# Patient Record
Sex: Male | Born: 1945 | Race: Black or African American | Hispanic: No | Smoking: Current every day smoker
Health system: Southern US, Community
[De-identification: ages and names within clinical notes are randomized; demographics above are authoritative.]

## PROBLEM LIST (undated history)

## (undated) DIAGNOSIS — R011 Cardiac murmur, unspecified: Secondary | ICD-10-CM

## (undated) DIAGNOSIS — R972 Elevated prostate specific antigen [PSA]: Secondary | ICD-10-CM

## (undated) DIAGNOSIS — C61 Malignant neoplasm of prostate: Secondary | ICD-10-CM

## (undated) DIAGNOSIS — F32A Depression, unspecified: Secondary | ICD-10-CM

## (undated) DIAGNOSIS — I10 Essential (primary) hypertension: Secondary | ICD-10-CM

## (undated) DIAGNOSIS — Z923 Personal history of irradiation: Secondary | ICD-10-CM

## (undated) DIAGNOSIS — F329 Major depressive disorder, single episode, unspecified: Secondary | ICD-10-CM

## (undated) DIAGNOSIS — Z8719 Personal history of other diseases of the digestive system: Secondary | ICD-10-CM

## (undated) DIAGNOSIS — R05 Cough: Secondary | ICD-10-CM

## (undated) DIAGNOSIS — IMO0001 Reserved for inherently not codable concepts without codable children: Secondary | ICD-10-CM

## (undated) DIAGNOSIS — R059 Cough, unspecified: Secondary | ICD-10-CM

## (undated) DIAGNOSIS — F419 Anxiety disorder, unspecified: Secondary | ICD-10-CM

## (undated) HISTORY — PX: HAND NERVE REPAIR: SHX1728

## (undated) HISTORY — DX: Malignant neoplasm of prostate: C61

## (undated) HISTORY — DX: Personal history of irradiation: Z92.3

## (undated) HISTORY — DX: Cardiac murmur, unspecified: R01.1

## (undated) HISTORY — DX: Elevated prostate specific antigen (PSA): R97.20

## (undated) HISTORY — DX: Anxiety disorder, unspecified: F41.9

## (undated) HISTORY — DX: Personal history of other diseases of the digestive system: Z87.19

## (undated) HISTORY — PX: HEMORRHOID SURGERY: SHX153

## (undated) HISTORY — DX: Major depressive disorder, single episode, unspecified: F32.9

## (undated) HISTORY — DX: Depression, unspecified: F32.A

---

## 2000-11-27 ENCOUNTER — Emergency Department (HOSPITAL_COMMUNITY): Admission: EM | Admit: 2000-11-27 | Discharge: 2000-11-27 | Payer: Self-pay | Admitting: Emergency Medicine

## 2001-10-15 ENCOUNTER — Emergency Department (HOSPITAL_COMMUNITY): Admission: EM | Admit: 2001-10-15 | Discharge: 2001-10-15 | Payer: Self-pay | Admitting: Emergency Medicine

## 2002-01-23 ENCOUNTER — Ambulatory Visit (HOSPITAL_COMMUNITY): Admission: RE | Admit: 2002-01-23 | Discharge: 2002-01-23 | Payer: Self-pay | Admitting: Family Medicine

## 2002-01-23 ENCOUNTER — Encounter: Payer: Self-pay | Admitting: Family Medicine

## 2004-02-08 ENCOUNTER — Inpatient Hospital Stay (HOSPITAL_COMMUNITY): Admission: EM | Admit: 2004-02-08 | Discharge: 2004-02-14 | Payer: Self-pay | Admitting: Emergency Medicine

## 2005-03-10 ENCOUNTER — Ambulatory Visit: Payer: Self-pay | Admitting: Family Medicine

## 2005-03-11 ENCOUNTER — Ambulatory Visit: Payer: Self-pay | Admitting: *Deleted

## 2005-03-26 ENCOUNTER — Ambulatory Visit: Payer: Self-pay | Admitting: Family Medicine

## 2005-08-02 ENCOUNTER — Ambulatory Visit: Payer: Self-pay | Admitting: Family Medicine

## 2005-11-03 ENCOUNTER — Ambulatory Visit: Payer: Self-pay | Admitting: Family Medicine

## 2005-12-02 ENCOUNTER — Ambulatory Visit: Payer: Self-pay | Admitting: Family Medicine

## 2005-12-07 ENCOUNTER — Ambulatory Visit (HOSPITAL_COMMUNITY): Admission: RE | Admit: 2005-12-07 | Discharge: 2005-12-07 | Payer: Self-pay | Admitting: Family Medicine

## 2006-02-01 ENCOUNTER — Ambulatory Visit: Payer: Self-pay | Admitting: Family Medicine

## 2007-07-06 DIAGNOSIS — F1910 Other psychoactive substance abuse, uncomplicated: Secondary | ICD-10-CM | POA: Insufficient documentation

## 2007-07-06 DIAGNOSIS — F1010 Alcohol abuse, uncomplicated: Secondary | ICD-10-CM | POA: Insufficient documentation

## 2007-07-06 DIAGNOSIS — I10 Essential (primary) hypertension: Secondary | ICD-10-CM | POA: Insufficient documentation

## 2007-07-06 DIAGNOSIS — F17200 Nicotine dependence, unspecified, uncomplicated: Secondary | ICD-10-CM

## 2007-07-10 DIAGNOSIS — Z87898 Personal history of other specified conditions: Secondary | ICD-10-CM

## 2008-01-26 ENCOUNTER — Encounter (INDEPENDENT_AMBULATORY_CARE_PROVIDER_SITE_OTHER): Payer: Self-pay | Admitting: Family Medicine

## 2008-01-26 ENCOUNTER — Ambulatory Visit: Payer: Self-pay | Admitting: Internal Medicine

## 2008-01-26 LAB — CONVERTED CEMR LAB
AST: 16 units/L (ref 0–37)
BUN: 11 mg/dL (ref 6–23)
Chloride: 106 meq/L (ref 96–112)
Cholesterol: 146 mg/dL (ref 0–200)
Creatinine, Ser: 0.86 mg/dL (ref 0.40–1.50)
HDL: 57 mg/dL (ref >39)
PSA: 6.11 ng/mL — ABNORMAL HIGH (ref 0.10–4.00)
Total CHOL/HDL Ratio: 2.6
Triglycerides: 50 mg/dL (ref <150)

## 2008-03-21 ENCOUNTER — Ambulatory Visit: Payer: Self-pay | Admitting: Internal Medicine

## 2008-04-23 ENCOUNTER — Ambulatory Visit: Payer: Self-pay | Admitting: Family Medicine

## 2008-05-24 ENCOUNTER — Ambulatory Visit: Payer: Self-pay | Admitting: Family Medicine

## 2008-05-27 ENCOUNTER — Ambulatory Visit (HOSPITAL_COMMUNITY): Admission: RE | Admit: 2008-05-27 | Discharge: 2008-05-27 | Payer: Self-pay | Admitting: Family Medicine

## 2010-10-23 ENCOUNTER — Encounter (INDEPENDENT_AMBULATORY_CARE_PROVIDER_SITE_OTHER): Payer: Self-pay | Admitting: Family Medicine

## 2010-10-23 LAB — CONVERTED CEMR LAB
Alkaline Phosphatase: 104 units/L (ref 39–117)
Basophils Relative: 1 % (ref 0–1)
CO2: 27 meq/L (ref 19–32)
Creatinine, Ser: 0.95 mg/dL (ref 0.40–1.50)
Eosinophils Absolute: 0.1 10*3/uL (ref 0.0–0.7)
Eosinophils Relative: 2 % (ref 0–5)
Glucose, Bld: 79 mg/dL (ref 70–99)
Hemoglobin: 15 g/dL (ref 13.0–17.0)
Lymphocytes Relative: 37 % (ref 12–46)
MCHC: 34.5 g/dL (ref 30.0–36.0)
Monocytes Relative: 8 % (ref 3–12)
Neutro Abs: 4.2 10*3/uL (ref 1.7–7.7)
PSA: 16.17 ng/mL — ABNORMAL HIGH (ref <=4.00)
Potassium: 4.3 meq/L (ref 3.5–5.3)
RBC: 5.02 M/uL (ref 4.22–5.81)
TSH: 1.255 microintl units/mL (ref 0.350–4.500)
Total Bilirubin: 0.8 mg/dL (ref 0.3–1.2)
Total CK: 59 units/L (ref 7–232)
Total Protein: 7 g/dL (ref 6.0–8.3)
WBC: 8.1 10*3/uL (ref 4.0–10.5)

## 2010-12-21 HISTORY — PX: PROSTATE BIOPSY: SHX241

## 2010-12-28 ENCOUNTER — Other Ambulatory Visit (HOSPITAL_COMMUNITY): Payer: Self-pay | Admitting: Family Medicine

## 2010-12-28 DIAGNOSIS — C61 Malignant neoplasm of prostate: Secondary | ICD-10-CM

## 2011-01-04 ENCOUNTER — Encounter (HOSPITAL_COMMUNITY): Payer: Self-pay

## 2011-01-04 ENCOUNTER — Ambulatory Visit (HOSPITAL_COMMUNITY): Payer: Self-pay

## 2011-01-05 ENCOUNTER — Ambulatory Visit (HOSPITAL_COMMUNITY): Payer: Self-pay

## 2011-01-05 ENCOUNTER — Other Ambulatory Visit (HOSPITAL_COMMUNITY): Payer: Self-pay

## 2011-01-05 ENCOUNTER — Encounter (HOSPITAL_COMMUNITY): Payer: Self-pay

## 2011-01-08 ENCOUNTER — Encounter (HOSPITAL_COMMUNITY): Payer: Self-pay

## 2011-01-08 ENCOUNTER — Inpatient Hospital Stay (HOSPITAL_COMMUNITY): Admission: RE | Admit: 2011-01-08 | Payer: Self-pay | Source: Ambulatory Visit

## 2011-01-13 ENCOUNTER — Ambulatory Visit (HOSPITAL_COMMUNITY)
Admission: RE | Admit: 2011-01-13 | Discharge: 2011-01-13 | Disposition: A | Discharge status: Home / Self Care | Payer: Self-pay | Source: Ambulatory Visit | Attending: Family Medicine | Admitting: Family Medicine

## 2011-01-13 ENCOUNTER — Ambulatory Visit (HOSPITAL_COMMUNITY)
Admission: RE | Admit: 2011-01-13 | Discharge: 2011-01-13 | Disposition: A | Discharge status: Home / Self Care | Payer: Self-pay | Source: Ambulatory Visit | Attending: Diagnostic Radiology | Admitting: Diagnostic Radiology

## 2011-01-13 ENCOUNTER — Other Ambulatory Visit (HOSPITAL_COMMUNITY): Payer: Self-pay | Admitting: Diagnostic Radiology

## 2011-01-13 ENCOUNTER — Encounter (HOSPITAL_COMMUNITY): Payer: Self-pay

## 2011-01-13 ENCOUNTER — Encounter (HOSPITAL_COMMUNITY)
Admission: RE | Admit: 2011-01-13 | Discharge: 2011-01-13 | Disposition: A | Discharge status: Home / Self Care | Payer: Self-pay | Source: Ambulatory Visit | Attending: Family Medicine | Admitting: Family Medicine

## 2011-01-13 DIAGNOSIS — C61 Malignant neoplasm of prostate: Secondary | ICD-10-CM

## 2011-01-13 DIAGNOSIS — M549 Dorsalgia, unspecified: Secondary | ICD-10-CM | POA: Insufficient documentation

## 2011-01-13 DIAGNOSIS — M47814 Spondylosis without myelopathy or radiculopathy, thoracic region: Secondary | ICD-10-CM | POA: Insufficient documentation

## 2011-01-13 DIAGNOSIS — Z8546 Personal history of malignant neoplasm of prostate: Secondary | ICD-10-CM | POA: Insufficient documentation

## 2011-01-13 DIAGNOSIS — R109 Unspecified abdominal pain: Secondary | ICD-10-CM | POA: Insufficient documentation

## 2011-01-13 LAB — CREATININE, SERUM: GFR calc Af Amer: >60 mL/min (ref >60)

## 2011-01-13 MED ORDER — TECHNETIUM TC 99M MEDRONATE IV KIT
23.8000 | PACK | Freq: Once | INTRAVENOUS | Status: AC | PRN
  Administered 2011-01-13: 23.8 via INTRAVENOUS

## 2011-01-15 ENCOUNTER — Ambulatory Visit (HOSPITAL_COMMUNITY): Payer: Self-pay

## 2011-01-15 ENCOUNTER — Encounter (HOSPITAL_COMMUNITY): Payer: Self-pay

## 2011-01-22 NOTE — Discharge Summary (Signed)
NAME:  ARBOR, William Mccullough                          ACCOUNT NO.:  [id number]   MEDICAL RECORD NO.:  [id number]                   PATIENT TYPE:  INP   LOCATION:  5705                                 FACILITY:  [facility]   PHYSICIAN:  Donald Pore, MD                    DATE OF BIRTH:  [birth year]   DATE OF ADMISSION:  02/08/2004  DATE OF DISCHARGE:  02/14/2004                                 DISCHARGE SUMMARY   DISCHARGE MEDICATIONS:  1.  Clonidine 0.02 mg p.o. b.i.d.  2.  Norvasc 10 mg one p.o. daily.  3.  Augmentin 875 mg one tablet b.i.d.  4.  Omeprazole 20 mg one p.o. daily.  5.  Percocet 5/325 one q.4h. p.r.n. pain.   DISCHARGE INSTRUCTIONS:  He is to abstain from alcohol.  Call for follow-up  appointment at [facility] in the next week.   DISCHARGE DIAGNOSES:  1.  Acute on chronic pancreatitis.  2.  Alcohol abuse.  3.  Pleural effusion versus pneumonia.  4.  Hypertension.   HOSPITAL COURSE:  [name] was admitted through the ED after one and a  half days of nausea and vomiting and belly pain.  No diarrhea, problems with  constipation.  He also complained of blood streaked emesis.  He has a  positive history of alcohol use and also complained of chills.  ETOH level  on admission was 123.  Initial lipase value was too high to be ready by the  machine, but upon dilution was 2548.  Blood pressure on admission was  176/110.   #1 - ACUTE ON CHRONIC PANCREATITIS:  Patient was treated with bowel rest and  analgesia.  On February 11, 2004 the patient was tolerating a clear liquid diet  and upon discharge on February 14, 2004 was eating a full diet.   #2 - ALCOHOL ABUSE/POLYSUBSTANCE ABUSE:  Patient was put on withdrawal  protocol.  He was treated with thiamine, folate, and multivitamin.  He was  instructed to abstain from alcohol upon discharge.  Patient was also given  counseling regarding his alcohol abuse and informed of treatment options.   #3 - HYPERTENSION:  Patient was hypertensive upon  admission was moderately  well controlled throughout his hospitalization.  His blood pressure was  elevated on day of discharge and this will need to be followed up upon by  his primary care physician.  His antihypertensive medication may need to be  further titrated over time.  He is discharged to home on clonidine and  Norvasc as previously stated.   #4 - PNEUMONIA VERSUS PLEURAL EFFUSION:  Patient was started on antibiotic  therapy for possible pneumonia.  He was discharged home on Augmentin b.i.d.  Upon discharge he was afebrile and tolerating antibiotic therapy well.  Elevated white blood cell count had resolved on discharge.   DISCHARGE LABORATORIES:  White blood cell count 7, hemoglobin 12.8,  hematocrit  38, platelet count 253.  BMET:  Sodium 131, potassium 4.4,  chloride 101, CO2 24, glucose 115, BUN 2, creatinine 0.8, calcium 9.0.  He  was discharged to home in stable condition.  He is to follow up within one  week with his primary care physician at [facility].                                                Donald Pore, MD    HP/MEDQ  D:  05/14/2004  T:  05/15/2004  Job:  [id number]

## 2011-04-13 ENCOUNTER — Other Ambulatory Visit (HOSPITAL_COMMUNITY): Payer: Self-pay | Admitting: Family Medicine

## 2011-04-13 DIAGNOSIS — C61 Malignant neoplasm of prostate: Secondary | ICD-10-CM

## 2011-04-19 ENCOUNTER — Ambulatory Visit (HOSPITAL_COMMUNITY)
Admission: RE | Admit: 2011-04-19 | Discharge: 2011-04-19 | Disposition: A | Discharge status: Home / Self Care | Payer: Self-pay | Source: Ambulatory Visit | Attending: Family Medicine | Admitting: Family Medicine

## 2011-04-19 DIAGNOSIS — C61 Malignant neoplasm of prostate: Secondary | ICD-10-CM | POA: Insufficient documentation

## 2011-04-19 LAB — CREATININE, SERUM
Creatinine, Ser: 1 mg/dL (ref 0.50–1.35)
GFR calc Af Amer: >60 mL/min (ref >60)
GFR calc non Af Amer: >60 mL/min (ref >60)

## 2011-04-19 MED ORDER — GADOBENATE DIMEGLUMINE 529 MG/ML IV SOLN
15.0000 mL | Freq: Once | INTRAVENOUS | Status: AC | PRN
  Administered 2011-04-19: 15 mL via INTRAVENOUS

## 2011-04-20 ENCOUNTER — Ambulatory Visit
Admission: RE | Admit: 2011-04-20 | Discharge: 2011-04-20 | Disposition: A | Discharge status: Home / Self Care | Payer: Self-pay | Source: Ambulatory Visit | Attending: Radiation Oncology | Admitting: Radiation Oncology

## 2011-04-20 DIAGNOSIS — F17200 Nicotine dependence, unspecified, uncomplicated: Secondary | ICD-10-CM | POA: Insufficient documentation

## 2011-04-20 DIAGNOSIS — C61 Malignant neoplasm of prostate: Secondary | ICD-10-CM | POA: Insufficient documentation

## 2011-04-20 DIAGNOSIS — I10 Essential (primary) hypertension: Secondary | ICD-10-CM | POA: Insufficient documentation

## 2011-07-20 ENCOUNTER — Other Ambulatory Visit: Payer: Self-pay | Admitting: Urology

## 2011-08-25 ENCOUNTER — Encounter (HOSPITAL_COMMUNITY): Payer: Self-pay | Admitting: Pharmacy Technician

## 2011-09-08 ENCOUNTER — Encounter (HOSPITAL_COMMUNITY): Payer: Self-pay

## 2011-09-08 ENCOUNTER — Encounter (HOSPITAL_COMMUNITY)
Admission: RE | Admit: 2011-09-08 | Discharge: 2011-09-08 | Disposition: A | Discharge status: Home / Self Care | Payer: Self-pay | Source: Ambulatory Visit | Attending: Urology | Admitting: Urology

## 2011-09-08 ENCOUNTER — Ambulatory Visit (HOSPITAL_COMMUNITY)
Admission: RE | Admit: 2011-09-08 | Discharge: 2011-09-08 | Disposition: A | Discharge status: Home / Self Care | Payer: Self-pay | Source: Ambulatory Visit | Attending: Urology | Admitting: Urology

## 2011-09-08 ENCOUNTER — Other Ambulatory Visit: Payer: Self-pay

## 2011-09-08 DIAGNOSIS — Z01812 Encounter for preprocedural laboratory examination: Secondary | ICD-10-CM | POA: Insufficient documentation

## 2011-09-08 DIAGNOSIS — Z01818 Encounter for other preprocedural examination: Secondary | ICD-10-CM | POA: Insufficient documentation

## 2011-09-08 DIAGNOSIS — I498 Other specified cardiac arrhythmias: Secondary | ICD-10-CM | POA: Insufficient documentation

## 2011-09-08 DIAGNOSIS — Z01810 Encounter for preprocedural cardiovascular examination: Secondary | ICD-10-CM | POA: Insufficient documentation

## 2011-09-08 HISTORY — DX: Cough: R05

## 2011-09-08 HISTORY — DX: Cough, unspecified: R05.9

## 2011-09-08 HISTORY — DX: Cardiac murmur, unspecified: R01.1

## 2011-09-08 HISTORY — DX: Essential (primary) hypertension: I10

## 2011-09-08 LAB — CBC
HCT: 35.4 % — ABNORMAL LOW (ref 39.0–52.0)
MCV: 83.1 fL (ref 78.0–100.0)
Platelets: 170 10*3/uL (ref 150–400)
RBC: 4.26 MIL/uL (ref 4.22–5.81)
RDW: 13.4 % (ref 11.5–15.5)
WBC: 7.9 10*3/uL (ref 4.0–10.5)

## 2011-09-08 LAB — BASIC METABOLIC PANEL
CO2: 26 mEq/L (ref 19–32)
Chloride: 104 mEq/L (ref 96–112)
Creatinine, Ser: 1.32 mg/dL (ref 0.50–1.35)
GFR calc Af Amer: 65 mL/min — ABNORMAL LOW (ref >90)
Potassium: 3.4 mEq/L — ABNORMAL LOW (ref 3.5–5.1)

## 2011-09-08 NOTE — Patient Instructions (Signed)
20 William Mccullough  09/08/2011   Your procedure is scheduled on: Monday 09/13/11  7:15 AM  Report to [facility] at 5:15 AM.  Call this number if you have problems the morning of surgery: [phone]   Remember:  FOLLOW LAXATIVE AND CLEAR LIQUID DIET AND ENEMA INSTRUCTIONS--DAY BEFORE YOUR SURGERY.  INSTRUCTIONS WERE GIVEN BY DR. Vevelyn Royals OFFICE   Do not eat food OR DRINK ANYTHING AFTER MIDNIGHT THE NIGHT BEFORE YOUR SURGERY.    Take these medicines the morning of surgery with A SIP OF WATER:  CLONIDINE   Do not wear jewelry.  Do not wear lotions.    Do not bring valuables to the hospital.  Contacts, dentures or bridgework may not be worn into surgery.  Leave suitcase in the car. After surgery it may be brought to your room.  For patients admitted to the hospital, checkout time is 11:00 AM the day of discharge.   Patients discharged the day of surgery will not be allowed to drive home.    Special Instructions: CHG Shower Use Special Wash: 1/2 bottle night before surgery and 1/2 bottle morning of surgery.   Please read over the following fact sheets that you were given: Blood Transfusion Information and MRSA Information AND INCENTIVE SPIROMETRY INSTRUCTIONS

## 2011-09-08 NOTE — Pre-Procedure Instructions (Signed)
PT HAS NEW ONSET OF COUGH-SOUNDS CONGESTED WHEN COUGHING--NO FEVER.  CXR WILL BE DONE TODAY PREOP-ALONG WITH EKG.

## 2011-09-11 NOTE — H&P (Signed)
CC: Prostate Cancer   History of Present Illness  [name] is a 67 year old with the following urologic history:  1) Elevated PSA: He was initially evaluated in 2007 for an elevated PSA of 4.30.  He underwent a biopsy in August 2007 which did not demonstrate malignancy.  He was recommended to followup but did not. He again presented in March 2012 after his PSA had increased to 16.17 in February 2012.  This was rechecked and had further increased to 44.94. 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  Aug 2007: 12 core biopsy -- negative, Vol 38.1 cc  TNM stage: cT1c Nx M0 PSA: 44.94 cc Gleason score: 3+3=6 Biopsy (12/21/10): 3/12 cores positive -- L apex (25%), R apex (< 5%), R lateral mid (< 5%) Prostate volume: 36.9 cc  Nomogram: OC disease: 66% EPE: 24% SVI: 15% LNI: 6.1% PFS: 92%, 89%  Baseline urinary function: IPSS: 13 Erectile function: SHIM score: 23.  He has elected to proceed with surgical therapy.    Past Medical History Problems  1. History of  Anxiety (Symptom) 300.00 2. History of  Depression 311 3. History of  Functional Murmur 785.2 4. History of  Hypertension 401.9  Surgical History Problems  1. History of  Destruction Of External Hemorrhoids 2. History of  Hand Repair Right  Current Meds 1. CefTRIAXone Sodium 500 MG Injection Solution Reconstituted; INJECT 500 MG Intramuscular;  To Be Done: 16Apr2012; Status: HOLD FOR - Administration 2. CloNIDine HCl 0.1 MG Oral Tablet; Therapy: (Recorded:30Mar2012) to 3. Micardis 20 MG Oral Tablet; Therapy: (Recorded:30Mar2012) to  Allergies Medication  1. No Known Drug Allergies  Family History Problems  1. Sororal history of  Diabetes Mellitus V18.0 2. Sororal history of  Hypertension V17.49  Social History Problems  1. Alcohol Use 2 servings daily 2. Caffeine Use 6 servings daily 3. Tobacco Use 305.1 currently smokes  about a pack and a half daily  Review of Systems  Genitourinary: dysuria, but no hematuria.  Constitutional: no fever.    Vitals  BMI Calculated: 18.51 BSA Calculated: 1.69 Height: 5 ft 9 in Weight: 125 lb    Physical Exam Constitutional: Well nourished and well developed . No acute distress.  ENT:. The ears and nose are normal in appearance.  Neck: The appearance of the neck is normal and no neck mass is present.  Pulmonary: No respiratory distress, normal respiratory rhythm and effort and clear bilateral breath sounds.  Cardiovascular: Heart rate and rhythm are normal . No peripheral edema.  Skin: Normal skin turgor, no visible rash and no visible skin lesions.  Neuro/Psych:. Mood and affect are appropriate.    Results/Data Urine [Data Includes: Last 1 Day]  12Nov2012  COLOR: YELLOW  Reference Range YELLOW APPEARANCE: CLEAR  Reference Range CLEAR SPECIFIC GRAVITY: <1.005  Abnormal Low Reference Range 1.005-1.030 pH: 5.5  Reference Range 5.0-8.0 GLUCOSE: NEG mg/dL Reference Range NEG BILIRUBIN: NEG  Reference Range NEG KETONE: NEG mg/dL Reference Range NEG BLOOD: NEG  Reference Range NEG PROTEIN: NEG mg/dL Reference Range NEG UROBILINOGEN: 0.2 mg/dL Reference Range 1.6-1.0 NITRITE: NEG  Reference Range NEG LEUKOCYTE ESTERASE: NEG  Reference Range NEG   Assessment Assessed  1. Prostate Cancer 185  Plan  Prostate cancer:  He will undergo a RAL radical prostatectomy and BPLND.

## 2011-09-13 ENCOUNTER — Other Ambulatory Visit: Payer: Self-pay

## 2011-09-13 ENCOUNTER — Encounter (HOSPITAL_COMMUNITY)
Admission: RE | Disposition: A | Discharge status: Home / Self Care | Payer: Self-pay | Source: Ambulatory Visit | Attending: Urology

## 2011-09-13 ENCOUNTER — Ambulatory Visit (HOSPITAL_COMMUNITY)
Admission: RE | Admit: 2011-09-13 | Discharge: 2011-09-13 | Disposition: A | Discharge status: Home / Self Care | Payer: Self-pay | Source: Ambulatory Visit | Attending: Urology | Admitting: Urology

## 2011-09-13 ENCOUNTER — Inpatient Hospital Stay (HOSPITAL_COMMUNITY): Payer: Self-pay | Admitting: Anesthesiology

## 2011-09-13 ENCOUNTER — Encounter (HOSPITAL_COMMUNITY): Payer: Self-pay | Admitting: Anesthesiology

## 2011-09-13 ENCOUNTER — Encounter (HOSPITAL_COMMUNITY): Payer: Self-pay

## 2011-09-13 DIAGNOSIS — F17200 Nicotine dependence, unspecified, uncomplicated: Secondary | ICD-10-CM | POA: Insufficient documentation

## 2011-09-13 DIAGNOSIS — Z5309 Procedure and treatment not carried out because of other contraindication: Secondary | ICD-10-CM | POA: Insufficient documentation

## 2011-09-13 DIAGNOSIS — I10 Essential (primary) hypertension: Secondary | ICD-10-CM | POA: Insufficient documentation

## 2011-09-13 DIAGNOSIS — C61 Malignant neoplasm of prostate: Secondary | ICD-10-CM | POA: Insufficient documentation

## 2011-09-13 HISTORY — DX: Reserved for inherently not codable concepts without codable children: IMO0001

## 2011-09-13 LAB — ABO/RH: ABO/RH(D): B NEG

## 2011-09-13 LAB — TYPE AND SCREEN: ABO/RH(D): B NEG

## 2011-09-13 SURGERY — ROBOTIC ASSISTED LAPAROSCOPIC RADICAL PROSTATECTOMY LEVEL 2
Anesthesia: General

## 2011-09-13 MED ORDER — LACTATED RINGERS IV SOLN
INTRAVENOUS | Status: DC | PRN
  Administered 2011-09-13: 07:00:00 via INTRAVENOUS

## 2011-09-13 MED ORDER — MIDAZOLAM HCL 5 MG/5ML IJ SOLN
INTRAMUSCULAR | Status: DC | PRN
  Administered 2011-09-13: 2 mg via INTRAVENOUS

## 2011-09-13 MED ORDER — FENTANYL CITRATE 0.05 MG/ML IJ SOLN: 25.0000 ug | INTRAMUSCULAR | Status: DC | PRN

## 2011-09-13 MED ORDER — CEFAZOLIN SODIUM 1-5 GM-% IV SOLN: 1.0000 g | INTRAVENOUS | Status: DC

## 2011-09-13 MED ORDER — LACTATED RINGERS IV SOLN: INTRAVENOUS | Status: DC

## 2011-09-13 MED ORDER — PROMETHAZINE HCL 25 MG/ML IJ SOLN: 6.2500 mg | INTRAMUSCULAR | Status: DC | PRN

## 2011-09-13 MED ORDER — FENTANYL CITRATE 0.05 MG/ML IJ SOLN
INTRAMUSCULAR | Status: DC | PRN
  Administered 2011-09-13: 100 ug via INTRAVENOUS

## 2011-09-13 MED ORDER — CLONIDINE HCL 0.1 MG PO TABS
0.1000 mg | ORAL_TABLET | Freq: Two times a day (BID) | ORAL | Status: DC
  Administered 2011-09-13: 0.1 mg via ORAL
  Filled 2011-09-13 (×4): qty 1

## 2011-09-13 SURGICAL SUPPLY — 34 items
CANISTER SUCTION 2500CC (MISCELLANEOUS) IMPLANT
CATH ROBINSON RED A/P 8FR (CATHETERS) IMPLANT
CHLORAPREP W/TINT 26ML (MISCELLANEOUS) IMPLANT
CLIP LIGATING HEM O LOK PURPLE (MISCELLANEOUS) IMPLANT
CLOTH BEACON ORANGE TIMEOUT ST (SAFETY) IMPLANT
CORD HIGH FREQUENCY UNIPOLAR (ELECTROSURGICAL) IMPLANT
COVER SURGICAL LIGHT HANDLE (MISCELLANEOUS) IMPLANT
COVER TIP SHEARS 8 DVNC (MISCELLANEOUS) IMPLANT
COVER TIP SHEARS 8MM DA VINCI (MISCELLANEOUS)
CUTTER ECHEON FLEX ENDO 45 340 (ENDOMECHANICALS) IMPLANT
DECANTER SPIKE VIAL GLASS SM (MISCELLANEOUS) IMPLANT
DRAPE SURG IRRIG POUCH 19X23 (DRAPES) IMPLANT
DRSG TEGADERM 6X8 (GAUZE/BANDAGES/DRESSINGS) IMPLANT
ELECT REM PT RETURN 9FT ADLT (ELECTROSURGICAL)
ELECTRODE REM PT RTRN 9FT ADLT (ELECTROSURGICAL) IMPLANT
GLOVE BIO SURGEON STRL SZ 6.5 (GLOVE) IMPLANT
GLOVE BIOGEL M STRL SZ7.5 (GLOVE) IMPLANT
GOWN STRL NON-REIN LRG LVL3 (GOWN DISPOSABLE) IMPLANT
HOLDER FOLEY CATH W/STRAP (MISCELLANEOUS) IMPLANT
IV LACTATED RINGERS 1000ML (IV SOLUTION) IMPLANT
KIT ACCESSORY DA VINCI DISP (KITS)
KIT ACCESSORY DVNC DISP (KITS) IMPLANT
NDL SAFETY ECLIPSE 18X1.5 (NEEDLE) IMPLANT
NEEDLE HYPO 18GX1.5 SHARP (NEEDLE)
PACK ROBOT UROLOGY CUSTOM (CUSTOM PROCEDURE TRAY) IMPLANT
POSITIONER SURGICAL ARM (MISCELLANEOUS) IMPLANT
RELOAD GREEN ECHELON 45 (STAPLE) IMPLANT
SET TUBE IRRIG SUCTION NO TIP (IRRIGATION / IRRIGATOR) IMPLANT
SOLUTION ELECTROLUBE (MISCELLANEOUS) IMPLANT
SPONGE GAUZE 4X4 12PLY (GAUZE/BANDAGES/DRESSINGS) IMPLANT
SUT VICRYL 0 UR6 27IN ABS (SUTURE) IMPLANT
SYR 27GX1/2 1ML LL SAFETY (SYRINGE) IMPLANT
TOWEL OR NON WOVEN STRL DISP B (DISPOSABLE) IMPLANT
WATER STERILE IRR 1500ML POUR (IV SOLUTION) IMPLANT

## 2011-09-13 NOTE — Anesthesia Postprocedure Evaluation (Signed)
Case cancelled secondary poorly controlled hypertension and new onset bundle branch block on EKG prior to induction. A Daison Braxton MD

## 2011-09-13 NOTE — Progress Notes (Signed)
Surgery cancelled due to hypertension and abnormal EKG

## 2011-09-13 NOTE — Anesthesia Preprocedure Evaluation (Addendum)
Anesthesia Evaluation  Patient identified by MRN, date of birth, ID band Patient awake    Reviewed: Allergy & Precautions, H&P , NPO status , Patient's Chart, lab work & pertinent test results  History of Anesthesia Complications Negative for: history of anesthetic complications  Airway Mallampati: III TM Distance: >3 FB Neck ROM: Full    Dental No notable dental hx. (+) Poor Dentition, Loose, Missing and Dental Advisory Given   Pulmonary Current Smoker (1 ppd),  clear to auscultation  Pulmonary exam normal       Cardiovascular Exercise Tolerance: Good hypertension, Pt. on medications neg cardio ROS - Valvular Problems/MurmursRegular Normal    Neuro/Psych Negative Neurological ROS  Negative Psych ROS   GI/Hepatic negative GI ROS, (+)     substance abuse  alcohol use,   Endo/Other  Negative Endocrine ROS  Renal/GU negative Renal ROS  Genitourinary negative   Musculoskeletal negative musculoskeletal ROS (+)   Abdominal Normal abdominal exam  (+)   Peds negative pediatric ROS (+)  Hematology negative hematology ROS (+)   Anesthesia Other Findings   Reproductive/Obstetrics negative OB ROS                          Anesthesia Physical Anesthesia Plan  ASA: II  Anesthesia Plan: General   Post-op Pain Management:    Induction: Intravenous  Airway Management Planned: Oral ETT  Additional Equipment:   Intra-op Plan:   Post-operative Plan: Extubation in OR  Informed Consent: I have reviewed the patients History and Physical, chart, labs and discussed the procedure including the risks, benefits and alternatives for the proposed anesthesia with the patient or authorized representative who has indicated his/her understanding and acceptance.   Dental advisory given  Plan Discussed with: CRNA  Anesthesia Plan Comments:         Anesthesia Quick Evaluation

## 2011-09-13 NOTE — Progress Notes (Signed)
[name] was scheduled for his radical prostatectomy today for treatment of his prostate cancer.  He was taken to the operating room in preparation for induction of anesthesia.  At that time he was noted to become extremely hypertensive with a BP of over 220/120 and had telemetry changes suggesting a new bundle branch block.  It was decided not induce anesthesia for these reasons and to cancel his surgery for today.  He has been completely asymptomatic and specifically denies any recent chest pain, palpitations, DOE, diaphoresis, etc. He is a poor historian but denies any prior cardiac history and denies ever seeing a cardiologist.  He will be transferred to the PACU and a 12-leak ECG will be obtained and we have consulted the Cardiology service to evaluate him to determine the need for any further acute evaluation and recommendations as well as to determine what outpatient evaluation he will require prior to proceeding with definitive surgery for treatment of his prostate cancer.  His surgery will tentatively be rescheduled for about 2-3 weeks from now to allow for further cardiac evaluation.  If he is found to have an underlying cardiac issue that requires further treatment and will make surgical treatment higher risk, I will consider altering our plan of treatment and discuss the option of radiation therapy with [name] again if necessary. Otherwise, he will be rescheduled for a few weeks from now for his radical prostatectomy.

## 2011-09-13 NOTE — Progress Notes (Signed)
Pt knows his cardiologist will be running further tests and Dr. Peggyann Shoals office will then reschedule surgery

## 2011-09-13 NOTE — Addendum Note (Signed)
Addendum  created 09/13/11 0910 by Aaliyah Gavel Glenn Veyda Kaufman, CRNA   Modules edited:Anesthesia LDA    

## 2011-09-13 NOTE — Progress Notes (Signed)
Surgical procedure cancelled secondary to poorly controlled hypertension and new onset bundle block per EKG in OR. Patient will require further consultation to evaluate new EKG changes and further blood pressure control. Patient made aware and transported to PACU.

## 2011-09-13 NOTE — Consult Note (Signed)
Cardiology Consult Note   Patient ID: William Mccullough MRN: [id number], DOB/AGE: March 09, 1946   Admit date: 09/13/2011 Date of Consult: 09/13/2011  Primary Physician: No primary provider on file. Primary Cardiologist: None  Pt. Profile:   William Mccullough 66 yo AA male with PMHx significant for HTN, tobacco abuse, hx of alcohol abuse, chronic pancreatitis and prostate cancer who was scheduled to undergo radical prostatectomy today; however, was canceled due to hypertension. It was also noted that he developed BBB on EKG.    Reason for consult: Cardiac evaluation of the above issues, and risk-stratification prior to rescheduled prostatectomy  Problem List: Past Medical History  Diagnosis Date  . Hypertension   . Heart murmur     pt told he has heart murmur-states it does not cause him any problems  . Cancer     prostate cancer  . Cough     09/08/11  NEW ONSET OF COUGH--STARTED 09/04/11 - NO FEVER--PT THINKS HE MAY BE GETTING A COLD  . Cold     Head congestion, non productive cough, afebrile    Past Surgical History  Procedure Date  . Hemorrhoid surgery      Allergies: No Known Allergies  HPI:   Pt was interviewed in the PACU and was found to be responsive, alert and cooperative. He was, however a poor historian. He denies follow-up with PCP or cardiologist.  He states that over the last 2-3 days, he has missed doses of his antihypertensives which include Clonidine and Micardis. He states that recently (over the past month) he has experienced sharp, exertional chest pain when climbing stares with associated DOE. He states that he has not been evaluated for these symptoms.He denies SOB, chest pain at rest, palpitations, diaphoresis and n/v.   He reports ~40 pack-year history and has a history of alcohol abuse, still consuming approx 2 drinks daily. He recalls possibly having an echocardiogram in the past, but does not remember the results. He states he was told he had a murmur once in  the [employer]. He denies prior CABG, cardiac catheterization and stress testing. No prior cardiac studies were found in Epic/Echart.  Today, he is hypertensive, currently asympatomatic, resting in the PACU.   Inpatient Medications:     . ceFAZolin (ANCEF) IV  1 g Intravenous 30 min Pre-Op  . cloNIDine  0.1 mg Oral BID   Prescriptions prior to admission  Medication Sig Dispense Refill  . aspirin 325 MG tablet Take 325 mg by mouth every 4 (four) hours as needed. Pain       . telmisartan-hydrochlorothiazide (MICARDIS HCT) 80-25 MG per tablet Take 1 tablet by mouth daily after breakfast.       . cloNIDine (CATAPRES) 0.1 MG tablet Take 0.1 mg by mouth 2 (two) times daily.         Family History  Problem Relation Age of Onset  . Diabetes type II Sister   . Hypertension Sister      History   Social History  . Marital Status: Legally Separated    Spouse Name: N/A    Number of Children: N/A  . Years of Education: N/A   Occupational History  . Retired    Social History Main Topics  . Smoking status: Current Everyday Smoker -- 1.0 packs/day for 40 years    Types: Cigarettes  . Smokeless tobacco: Never Used  . Alcohol Use: 7.0 oz/week    14 drink(s) per week     1 weekend per month  .  Drug Use: No  . Sexually Active: Not on file   Other Topics Concern  . Not on file   Social History Narrative   Lives in [place], [place]. Drinks 6 servings of caffeine daily.      Review of Systems: General: negative for chills, fever, night sweats or weight changes.  Cardiovascular: positive for chest pain, dyspnea on exertion and PND, negative for edema, orthopnea, palpitations or shortness of breath Dermatological: negative for rash Respiratory: negative for cough or wheezing Urologic: negative for hematuria Abdominal: negative for nausea, vomiting, diarrhea, bright red blood per rectum, melena, or hematemesis Neurologic: negative for visual changes, syncope, or dizziness All other  systems reviewed and are otherwise negative except as noted above.  Physical Exam: Blood pressure 182/100, pulse 64, temperature 98.8 F (37.1 C), resp. rate 14, SpO2 98.00%.   General: Well developed, well nourished, NAD Head: Normocephalic, atraumatic, sclera non-icteric, no xanthomas Neck: Supple. Negative for carotid bruits. JVD not elevated. Lungs: Expiratory rhonchi in RL, RML fields. No wheezes or rales. Breathing is unlabored. Heart: RRR with S1 S2. No murmurs, rubs, or gallops appreciated. Abdomen: Soft, non-tender, non-distended with normoactive bowel sounds. No hepatomegaly. No rebound/guarding. No obvious abdominal masses. Msk:  Strength and tone appears normal for age. Extremities: No clubbing, cyanosis or edema.  Distal pedal pulses are 2+ and equal bilaterally. Neuro: Alert and oriented X 3. Moves all extremities spontaneously. Psych:  Responds to questions appropriately with a normal affect.  Labs: No results found for this basename: WBC:2,HGB:2,HCT:2,MCV:2,PLT:2 in the last 72 hours No results found for this basename: VITAMINB12,FOLATE,FERRITIN,TIBC,IRON,RETICCTPCT in the last 72 hours No results found for this basename: DDIMER:2 in the last 72 hours  Lab 09/08/11 0955  NA 136  K 3.4*  CL 104  CO2 26  BUN 10  CREATININE 1.32  CALCIUM 9.0  PROT --  BILITOT --  ALKPHOS --  ALT --  AST --  AMYLASE --  LIPASE --  GLUCOSE 82   No results found for this basename: HGBA1C in the last 72 hours No results found for this basename: CKTOTAL:3,CKMB:3,CKMBINDEX:3,TROPONINI:3 in the last 72 hours No components found with this basename: POCBNP No results found for this basename: CHOL,HDL,LDLCALC,TRIG,CHOLHDL,LDLDIRECT in the last 72 hours No results found for this basename: TSH,T4TOTAL,FREET3,T3FREE,THYROIDAB in the last 72 hours  Radiology/Studies: Dg Chest 2 View  09/08/2011  *RADIOLOGY REPORT*  Clinical Data: Preoperative prostatectomy with history of hypertension.   New onset of cough.  CHEST - 2 VIEW  Comparison: 01/13/2011  Findings: Heart size is upper limits of normal and a normal mediastinal contour is seen.  The lung fields are clear. No pleural effusion or significant peribronchial cuffing was seen.  Bony structures demonstrate diffuse osteophytosis of the lower thoracic spine and are otherwise intact.  IMPRESSION: No worrisome or acute cardiopulmonary abnormality seen.  Original Report Authenticated By: Bertha Stakes, M.D.    EKG:   09/13/2011 0807-  NSR, 60 bpm, LVH 09/12/2010  0810- sinus bradycardia, 56 bpm, LVH, TWI V1, BBB V2 (this tracing is more consistent with prior tracing on 09/08/2011)  ASSESSMENT AND PLAN:   1. Uncontrolled Hypertension- secondary to recent medication noncompliance with likely Clonidine rebound.   - Will give outpatient dose of Clonidine and monitor  - Will stress medical compliance, tobacco/alcohol cessation  - Once BP controlled, can be discharged home with cardiology follow-up (see below)  2. Chest pain- patient's symptoms are consistent with stable angina. He denies formal cardiac work-up in the past besides an  echocardiogram. He has significant cardiac history in tobacco abuse and HTN. As he does not follow-up with PCP regularly, may also have HL. Would benefit from outpatient stress testing and repeat echocardiogram. Did note to have LVH on EKG today, which was consistent with prior tracings likely representative changes due chronic and uncontrolled HTN. Upper limit of normal cardiac silhouette on recent CXR with otherwise not acute cardiopulmonary procress.   -Will schedule outpatient follow-up with the aforementioned studies and risk-stratify prior to rescheduled prostatectomy.   - Order repeat EKG once BP better controlled   3. Hypokalemia- mild  - Would benefit from supplementation prior to discharge  Signed, R. Hurman Horn, PA-C 09/13/2011, 10:27 AM

## 2011-09-13 NOTE — Transfer of Care (Signed)
Immediate Anesthesia Transfer of Care Note  Patient: William Mccullough  Procedure(s) Performed:  ROBOTIC ASSISTED LAPAROSCOPIC RADICAL PROSTATECTOMY LEVEL 2 - Robotic Assisted Laparoscopic Radical Prostatectomy ,Bilateral Pelvic Lymphadenectomy Level 2  Patient Location: PACU  Anesthesia Type: MAC and Case cancelled due to EKG changes and elevated diastolic BP  Level of Consciousness: awake, alert , oriented, patient cooperative and responds to stimulation  Airway & Oxygen Therapy: Patient Spontanous Breathing and Patient connected to face mask oxygen  Post-op Assessment: Report given to PACU RN and Post -op Vital signs reviewed and stable  Post vital signs: Reviewed and stable  Complications: No apparent anesthesia complications

## 2011-09-13 NOTE — Progress Notes (Signed)
Dr. Laverle Patter aware of cardiology findings. OK to go home. Meds given.

## 2011-09-13 NOTE — Progress Notes (Signed)
Dr. Laverle Patter in to speak with pt and aware of 12 lead tracing. Cardiology to see.

## 2011-09-13 NOTE — Addendum Note (Signed)
Addendum  created 09/13/11 0910 by Zebedee Iba, CRNA   Modules edited:Anesthesia LDA

## 2011-09-13 NOTE — Progress Notes (Signed)
Dr. Rica Mast and aware of 12 lead. No new orders at this time.

## 2011-09-13 NOTE — Preoperative (Signed)
Beta Blockers   Reason not to administer Beta Blockers:Not Applicable 

## 2011-09-13 NOTE — OR Nursing (Signed)
Surgery was canceled per Dr. Rica Mast and Dr. Laverle Patter due to patient having EKG changes and elevated Blood Pressure upon entering the OR

## 2011-09-17 ENCOUNTER — Encounter: Payer: Self-pay | Admitting: Cardiology

## 2011-09-17 ENCOUNTER — Ambulatory Visit (INDEPENDENT_AMBULATORY_CARE_PROVIDER_SITE_OTHER): Payer: Self-pay | Admitting: Cardiology

## 2011-09-17 DIAGNOSIS — F17200 Nicotine dependence, unspecified, uncomplicated: Secondary | ICD-10-CM

## 2011-09-17 DIAGNOSIS — R079 Chest pain, unspecified: Secondary | ICD-10-CM

## 2011-09-17 DIAGNOSIS — R0602 Shortness of breath: Secondary | ICD-10-CM

## 2011-09-17 DIAGNOSIS — I10 Essential (primary) hypertension: Secondary | ICD-10-CM

## 2011-09-17 MED ORDER — BUPROPION HCL ER (SR) 150 MG PO TB12: 150.0000 mg | ORAL_TABLET | Freq: Two times a day (BID) | ORAL | Status: DC

## 2011-09-17 MED ORDER — AMLODIPINE BESYLATE 5 MG PO TABS: 5.0000 mg | ORAL_TABLET | Freq: Every day | ORAL | Status: DC

## 2011-09-17 NOTE — Patient Instructions (Addendum)
Start amlodipine 5mg  daily.  Use wellbutrin to help you stop smoking. Take 150mg  daily for 3 days then increase to 150mg  twice a day. You can take this for a total of 90 days.  Your physician has requested that you have en exercise stress myoview. For further information please visit [email or web address] Please follow instruction sheet, as given.  Your physician has requested that you have an echocardiogram. Echocardiography is a painless test that uses sound waves to create images of your heart. It provides your doctor with information about the size and shape of your heart and how well your heart's chambers and valves are working. This procedure takes approximately one hour. There are no restrictions for this procedure.  Your physician recommends that you return for a FASTING lipid profile--786.50  786.05  Your physician recommends that you schedule a follow-up appointment in: 2 weeks with Dr Shirlee Latch.

## 2011-09-19 DIAGNOSIS — R079 Chest pain, unspecified: Secondary | ICD-10-CM | POA: Insufficient documentation

## 2011-09-19 NOTE — Progress Notes (Signed)
PCP: Georganna Skeans ([facility])  66 yo with history of HTN and smoking presents for cardiology evaluation prior to prostate surgery.  He was recently diagnosed with prostate cancer by biopsy and is planned for robotic laparoscopic radical prostatectomy.  He was noted to have very high blood pressure and was therefore sent for cardiology evaluation  BP today is 179/98.  He reports elevated BP for years, since at least the 1990s.  He follows a high sodium diet in general.    Patient today also reports episodes of chest tightness.  Last Tuesday, he noted central chest tightness while riding his bike.  This resolved with rest.  He has been noting chest tightness with heavy exertion such as walking up steps or riding his bike.  He also gets short of breath if he climbs steps or walks fast.  He is smoking about 1 ppd.  He has had a cough and URI symptoms for about 2 weeks.    ECG: NSR, LVH, LAE  Labs (1/13): K 3.4, creatinine 1.32  PMH: 1. HTN 2. H/o ETOH abuse 3. H/o acute pancreatitis 4. Prostate cancer diagnosed by biopsy 5. Active smoker  SH: Retired.  Lives with sister.  Smokes 1 ppd.  Prior heavy ETOH, now some beer on weekends.  FH: Nephew had heart surgery.  He does not know about any CAD.   ROS: All systems negative except as per HPI.   Current Outpatient Prescriptions  Medication Sig Dispense Refill  . aspirin 325 MG tablet Take 325 mg by mouth every 4 (four) hours as needed. Pain       . cloNIDine (CATAPRES) 0.1 MG tablet Take 0.1 mg by mouth 2 (two) times daily.       Marland Kitchen telmisartan-hydrochlorothiazide (MICARDIS HCT) 80-25 MG per tablet Take 1 tablet by mouth daily after breakfast.       . amLODipine (NORVASC) 5 MG tablet Take 1 tablet (5 mg total) by mouth daily.  30 tablet  6  . buPROPion (WELLBUTRIN SR) 150 MG 12 hr tablet Take 1 tablet (150 mg total) by mouth 2 (two) times daily.  60 tablet  2    BP 179/98  Resp 65  Ht 5\' 9"  (1.753 m)  Wt 58.514 kg (129 lb)  BMI 19.05  kg/m2 General: NAD Neck: No JVD, no thyromegaly or thyroid nodule.  Lungs: Clear to auscultation bilaterally with normal respiratory effort. CV: Nondisplaced PMI.  Heart regular S1/S2, no S3, +S4, no murmur.  No peripheral edema.  No carotid bruit.  Normal pedal pulses.  Abdomen: Soft, nontender, no hepatosplenomegaly, no distention.  Skin: Intact without lesions or rashes.  Neurologic: Alert and oriented x 3.  Psych: Normal affect. Extremities: No clubbing or cyanosis.  HEENT: Normal.

## 2011-09-19 NOTE — Assessment & Plan Note (Signed)
BP is high and has been so for years.  He follows a high sodium diet.  - Cut back on salt in diet.  - Add amlodipine 5 mg daily and continue clonidine and telmisartan/HCTZ.  - Echo to assess for LVH.

## 2011-09-19 NOTE — Assessment & Plan Note (Signed)
I strongly encouraged him to stop smoking.  He is going to try Wellbutrin.

## 2011-09-19 NOTE — Assessment & Plan Note (Signed)
Exertional chest pain.  He has a number of risk factors for CAD.  I will get an ETT-myoview for risk stratification.  He will continue ASA.  I will also check lipids.    He should hold off on prostate surgery until BP is under control and we evaluate his exertional chest pain.

## 2011-09-23 ENCOUNTER — Ambulatory Visit (HOSPITAL_COMMUNITY): Payer: Self-pay | Attending: Cardiology | Admitting: Radiology

## 2011-09-23 ENCOUNTER — Other Ambulatory Visit (INDEPENDENT_AMBULATORY_CARE_PROVIDER_SITE_OTHER): Payer: Self-pay | Admitting: *Deleted

## 2011-09-23 DIAGNOSIS — I10 Essential (primary) hypertension: Secondary | ICD-10-CM | POA: Insufficient documentation

## 2011-09-23 DIAGNOSIS — R61 Generalized hyperhidrosis: Secondary | ICD-10-CM | POA: Insufficient documentation

## 2011-09-23 DIAGNOSIS — F17200 Nicotine dependence, unspecified, uncomplicated: Secondary | ICD-10-CM | POA: Insufficient documentation

## 2011-09-23 DIAGNOSIS — R5381 Other malaise: Secondary | ICD-10-CM | POA: Insufficient documentation

## 2011-09-23 DIAGNOSIS — R42 Dizziness and giddiness: Secondary | ICD-10-CM | POA: Insufficient documentation

## 2011-09-23 DIAGNOSIS — R072 Precordial pain: Secondary | ICD-10-CM

## 2011-09-23 DIAGNOSIS — R079 Chest pain, unspecified: Secondary | ICD-10-CM

## 2011-09-23 DIAGNOSIS — R55 Syncope and collapse: Secondary | ICD-10-CM | POA: Insufficient documentation

## 2011-09-23 DIAGNOSIS — R0789 Other chest pain: Secondary | ICD-10-CM | POA: Insufficient documentation

## 2011-09-23 DIAGNOSIS — Z01810 Encounter for preprocedural cardiovascular examination: Secondary | ICD-10-CM | POA: Insufficient documentation

## 2011-09-23 DIAGNOSIS — R0989 Other specified symptoms and signs involving the circulatory and respiratory systems: Secondary | ICD-10-CM | POA: Insufficient documentation

## 2011-09-23 DIAGNOSIS — I447 Left bundle-branch block, unspecified: Secondary | ICD-10-CM

## 2011-09-23 DIAGNOSIS — R0602 Shortness of breath: Secondary | ICD-10-CM

## 2011-09-23 DIAGNOSIS — R0609 Other forms of dyspnea: Secondary | ICD-10-CM | POA: Insufficient documentation

## 2011-09-23 LAB — LIPID PANEL
LDL Cholesterol: 73 mg/dL (ref 0–99)
Total CHOL/HDL Ratio: 3

## 2011-09-23 MED ORDER — TECHNETIUM TC 99M TETROFOSMIN IV KIT
10.0000 | PACK | Freq: Once | INTRAVENOUS | Status: AC | PRN
  Administered 2011-09-23: 10 via INTRAVENOUS

## 2011-09-23 MED ORDER — TECHNETIUM TC 99M TETROFOSMIN IV KIT
30.0000 | PACK | Freq: Once | INTRAVENOUS | Status: AC | PRN
  Administered 2011-09-23: 30 via INTRAVENOUS

## 2011-09-23 MED ORDER — REGADENOSON 0.4 MG/5ML IV SOLN
0.4000 mg | Freq: Once | INTRAVENOUS | Status: AC
  Administered 2011-09-23: 0.4 mg via INTRAVENOUS

## 2011-09-23 NOTE — Progress Notes (Addendum)
[facility] SITE 3 NUCLEAR MED [address] [place] [address] [phone]  Cardiology Nuclear Med Study  William Mccullough is a 66 y.o. male [id number] 1946-08-08   Nuclear Med Background Indication for Stress Test:  Evaluation for Ischemia and Pending Surgical Clearance for Prostatectomy  by Dr. Crecencio Mc History:  No previous documented CAD Cardiac Risk Factors: Hypertension and Smoker  Symptoms: Chest Tightness with and without exertion (last occurrence was Tuesday, 09/21/11), Diaphoresis, Dizziness, DOE, Fatigue and Near Syncope   Nuclear Pre-Procedure Caffeine/Decaff Intake:  None NPO After: 9:00pm   Lungs:  Clear.  O2 SAT 98% on RA. IV 0.9% NS with Angio Cath:  22g  IV Site: L Antecubital, tolerated well IV Started by:  Irean Hong, RN  Chest Size (in):  38 Cup Size: n/a  Height: 5\' 9"  (1.753 m)  Weight:  123 lb (55.792 kg)  BMI:  Body mass index is 18.16 kg/(m^2). Tech Comments:  All medications taken.    Nuclear Med Study 1 or 2 day study: 1 day  Stress Test Type:  Eugenie Birks  Reading MD: Olga Millers, MD  Order Authorizing Provider:  [name], MD  Resting Radionuclide: Technetium 57m Tetrofosmin  Resting Radionuclide Dose: 11.0 mCi   Stress Radionuclide:  Technetium 12m Tetrofosmin  Stress Radionuclide Dose: 33.0 mCi           Stress Protocol Rest HR: 59 Stress HR: 92  Rest BP: 130/82 Stress BP: 133/82  Exercise Time (min): n/a METS: n/a   Predicted Max HR: 157 bpm % Max HR: 58.6 bpm Rate Pressure Product: 21308   Dose of Adenosine (mg):  n/a Dose of Lexiscan: 0.4 mg  Dose of Atropine (mg): n/a Dose of Dobutamine: n/a mcg/kg/min (at max HR)  Stress Test Technologist: Smiley Houseman, CMA-N  Nuclear Technologist:  Domenic Polite, CNMT     Rest Procedure:  Myocardial perfusion imaging was performed at rest 45 minutes following the intravenous administration of Technetium 23m Tetrofosmin.  Rest ECG: LVH with strain, sinus  bradycardia.  Stress Procedure: The patient attempted to walk the treadmill utilizing the Bruce Protocol for 2:04 , but was unable to reach his target heart rate due to fatigue and an exercise induced LBBB.  He went into LBBB after only one minute of exercise. He was then given IV Lexiscan 0.4 mg over 15-seconds at rest.  Technetium 55m Tetrofosmin was injected at 30-seconds.  He again developed LBBB after one minute into the Lexiscan, but returned to baseline 2-minutes into recovery.  He did c/o chest pressure, 2/10, with the LBBB during Lexiscan.  Quantitative spect images were obtained after a 45 minute delay.  Stress ECG: Uninteretable due to development of rate related LBBB  QPS Raw Data Images:  Acquisition technically good; normal left ventricular size. Stress Images:  Normal homogeneous uptake in all areas of the myocardium. Rest Images:  Normal homogeneous uptake in all areas of the myocardium. Subtraction (SDS):  No evidence of ischemia. Transient Ischemic Dilatation (Normal <1.22):  0.98 Lung/Heart Ratio (Normal <0.45):  0.25  Quantitative Gated Spect Images QGS EDV:  98 ml QGS ESV:  47 ml QGS cine images:  NL LV Function; NL Wall Motion QGS EF: 52%  Impression Exercise Capacity:  Lexiscan with no exercise. BP Response:  Normal blood pressure response. Clinical Symptoms:  There is chest pain ECG Impression:  EKG uninterpretable due to development of rate related LBBB with stress. Comparison with Prior Nuclear Study: No previous nuclear study performed  Overall Impression:  Normal stress nuclear study.   Olga Millers   EF low on echo (40-45%), 52% on myoview.  He had a rate-related LBBB on ETT.  No perfusion defect.  Given his symptoms, will need to consider cath prior to prostate surgery.  I will need to talk to him more about his studies and would like to see him in the office early next week . The low EF could certainly be due to poor control of HTN over years.    [name] 09/24/2011 9:59 AM

## 2011-09-24 ENCOUNTER — Telehealth: Payer: Self-pay | Admitting: *Deleted

## 2011-09-24 NOTE — Telephone Encounter (Signed)
09/24/11--1340p--spoke with pt and gave results of stress test and explained that dr Shirlee Latch wants him to have c cath before prostrate surgery--pt is in agreement and i reminded him he has an appoint with dr Shirlee Latch next Friday and dr Shirlee Latch will explain everything at that visit and perhaps set him up for c cath--nt

## 2011-09-28 ENCOUNTER — Encounter (HOSPITAL_COMMUNITY): Payer: Self-pay | Admitting: Pharmacy Technician

## 2011-09-28 NOTE — Progress Notes (Signed)
Pt aware. See phone note 09/24/11.

## 2011-09-29 ENCOUNTER — Ambulatory Visit: Payer: Self-pay | Admitting: Cardiovascular Disease

## 2011-10-01 ENCOUNTER — Encounter: Payer: Self-pay | Admitting: Cardiology

## 2011-10-01 ENCOUNTER — Encounter: Payer: Self-pay | Admitting: *Deleted

## 2011-10-01 ENCOUNTER — Ambulatory Visit (INDEPENDENT_AMBULATORY_CARE_PROVIDER_SITE_OTHER): Payer: Self-pay | Admitting: Cardiology

## 2011-10-01 VITALS — BP 140/94 | HR 93 | Ht 69.0 in | Wt 126.0 lb

## 2011-10-01 DIAGNOSIS — Z01818 Encounter for other preprocedural examination: Secondary | ICD-10-CM

## 2011-10-01 DIAGNOSIS — I429 Cardiomyopathy, unspecified: Secondary | ICD-10-CM | POA: Insufficient documentation

## 2011-10-01 DIAGNOSIS — F17200 Nicotine dependence, unspecified, uncomplicated: Secondary | ICD-10-CM

## 2011-10-01 DIAGNOSIS — R079 Chest pain, unspecified: Secondary | ICD-10-CM

## 2011-10-01 DIAGNOSIS — I428 Other cardiomyopathies: Secondary | ICD-10-CM

## 2011-10-01 DIAGNOSIS — I10 Essential (primary) hypertension: Secondary | ICD-10-CM

## 2011-10-01 LAB — CBC WITH DIFFERENTIAL/PLATELET
Basophils Relative: 0.5 % (ref 0.0–3.0)
Eosinophils Relative: 1.2 % (ref 0.0–5.0)
HCT: 47.9 % (ref 39.0–52.0)
MCV: 90.3 fl (ref 78.0–100.0)
Monocytes Absolute: 0.7 10*3/uL (ref 0.1–1.0)
Neutrophils Relative %: 52.7 % (ref 43.0–77.0)
RBC: 5.31 Mil/uL (ref 4.22–5.81)
WBC: 9.6 10*3/uL (ref 4.5–10.5)

## 2011-10-01 LAB — BASIC METABOLIC PANEL
Chloride: 100 mEq/L (ref 96–112)
Creatinine, Ser: 1.6 mg/dL — ABNORMAL HIGH (ref 0.4–1.5)
Potassium: 4.2 mEq/L (ref 3.5–5.1)

## 2011-10-01 LAB — PROTIME-INR: INR: 1.1 ratio — ABNORMAL HIGH (ref 0.8–1.0)

## 2011-10-01 MED ORDER — AMLODIPINE BESYLATE 10 MG PO TABS: 10.0000 mg | ORAL_TABLET | Freq: Every day | ORAL | Status: DC

## 2011-10-01 NOTE — Progress Notes (Signed)
PCP: Amelia Wilson ([facility])  65 yo with history of HTN and smoking presented initially for cardiology evaluation prior to prostate surgery.  He was recently diagnosed with prostate cancer by biopsy and is planned for robotic laparoscopic radical prostatectomy.  He was noted to have very high blood pressure and was therefore sent for cardiology evaluation.  At last appointment, I added amlodipine.  BP is still elevated but improved.    He has been noting chest tightness with heavy exertion such as walking up steps or riding his bike.  This most commonly occurs when he rides his bike.  He will have to stop and get down to rest.  He will then walk his bike rather than trying to ride again.  He also gets short of breath if he climbs steps or walks fast.  He is smoking about 1 ppd.  He has started Wellbutrin and is trying to quit.  Echocardiogram was abnormal with EF 40-45% and diffuse hypokinesis.  ETT-myoview showed poor exercise tolerance and exercise-induced LBBB.  However, perfusion images were normal.   Labs (1/13): K 3.4, creatinine 1.32, LDL 73, HDL 46  PMH: 1. HTN 2. H/o ETOH abuse 3. H/o acute pancreatitis 4. Prostate cancer diagnosed by biopsy 5. Active smoker 6. Cardiomyopathy: Echo (1/13) with EF 40-45%, diffuse hypokinesis, mild LVH, mild MR.  ETT-myoview (1/13): patient only able to exercise 2:04 due to fatigue and dyspnea, also developed LBBB, switched to Lexiscan and developed chest pain with LBBB again; EF 52% with no perfusion defect.   SH: Retired.  Lives with sister.  Smokes 1 ppd.  Prior heavy ETOH, now some beer on weekends.  FH: Nephew had heart surgery.  He does not know about any CAD.   ROS: All systems negative except as per HPI.   Current Outpatient Prescriptions  Medication Sig Dispense Refill  . aspirin 325 MG tablet Take 325 mg by mouth every 4 (four) hours as needed. Pain       . buPROPion (WELLBUTRIN SR) 150 MG 12 hr tablet Take 1 tablet (150 mg total) by  mouth 2 (two) times daily.  60 tablet  2  . cloNIDine (CATAPRES) 0.1 MG tablet Take 0.1 mg by mouth 2 (two) times daily.       . telmisartan-hydrochlorothiazide (MICARDIS HCT) 80-25 MG per tablet Take 1 tablet by mouth daily after breakfast.       . DISCONTD: amLODipine (NORVASC) 5 MG tablet Take 5 mg by mouth daily before breakfast.      . amLODipine (NORVASC) 10 MG tablet Take 1 tablet (10 mg total) by mouth daily.  30 tablet  6    BP 140/94  Pulse 93  Ht 5' 9" (1.753 m)  Wt 57.153 kg (126 lb)  BMI 18.61 kg/m2 General: NAD, thin Neck: No JVD, no thyromegaly or thyroid nodule.  Lungs: Clear to auscultation bilaterally with normal respiratory effort. CV: Nondisplaced PMI.  Heart regular S1/S2, no S3, +S4, no murmur.  No peripheral edema.  No carotid bruit.  Normal pedal pulses.  Abdomen: Soft, nontender, no hepatosplenomegaly, no distention.  Neurologic: Alert and oriented x 3.  Psych: Normal affect. Extremities: No clubbing or cyanosis.   

## 2011-10-01 NOTE — Assessment & Plan Note (Signed)
EF 40-45% by echo with diffuse hypokinesis.  He is on ARB, will start Coreg at next appointment.

## 2011-10-01 NOTE — Patient Instructions (Signed)
Increase amlodipine (norvasc) to 10mg  daily.  Your physician has requested that you have a cardiac catheterization. Cardiac catheterization is used to diagnose and/or treat various heart conditions. Doctors may recommend this procedure for a number of different reasons. The most common reason is to evaluate chest pain. Chest pain can be a symptom of coronary artery disease (CAD), and cardiac catheterization can show whether plaque is narrowing or blocking your heart's arteries. This procedure is also used to evaluate the valves, as well as measure the blood flow and oxygen levels in different parts of your heart. For further information please visit [email or web address] Please follow instruction sheet, as given. Thursday Sherrie Mustache 16,[id number]   Your physician recommends that you schedule a follow-up appointment in: about 2 weeks after the cath 10/07/11 with Dr Shirlee Latch.

## 2011-10-01 NOTE — Assessment & Plan Note (Signed)
Patient is taking Wellbutrin and working on quitting smoking.  I again strongly encouraged him to quit.

## 2011-10-01 NOTE — Assessment & Plan Note (Signed)
Exertional chest pain worrisome for angina in this patient with history of heavy smoking and HTN.  EF was low by echo.  While there was no perfusion defect on myoview, patient had poor exercise tolerance with exercise-induced LBBB.  Given these findings, I would like to define his anatomy by cardiac catheterization.  I will do this next week.  Continue ASA, if CAD is found will need statin.

## 2011-10-01 NOTE — Assessment & Plan Note (Signed)
BP better but still high.  Increase amlodipine to 10 mg daily.

## 2011-10-01 NOTE — Assessment & Plan Note (Signed)
Given high risk findings on William Mccullough initial evaluation, I would like to do his cardiac catheterization prior to prostate surgery.  I will send this note to Dr. Laverle Patter.

## 2011-10-04 ENCOUNTER — Encounter (HOSPITAL_COMMUNITY): Admission: RE | Payer: Self-pay | Source: Ambulatory Visit

## 2011-10-04 ENCOUNTER — Inpatient Hospital Stay (HOSPITAL_COMMUNITY): Admission: RE | Admit: 2011-10-04 | Payer: Self-pay | Source: Ambulatory Visit | Admitting: Urology

## 2011-10-04 SURGERY — ROBOTIC ASSISTED LAPAROSCOPIC RADICAL PROSTATECTOMY LEVEL 2
Anesthesia: General

## 2011-10-05 ENCOUNTER — Other Ambulatory Visit: Payer: Self-pay | Admitting: Cardiology

## 2011-10-05 ENCOUNTER — Telehealth: Payer: Self-pay | Admitting: *Deleted

## 2011-10-05 NOTE — Telephone Encounter (Signed)
Dr Shirlee Latch requested time for cath 10/07/11 be changed from 8:30am to 7:30am. I have changed the cath time from 8:30am to 7:30am  with Aurea Graff in the [facility] lab. Pt is aware that he should arrive at 6:30am for a 7:30am cath.

## 2011-10-07 ENCOUNTER — Encounter (HOSPITAL_BASED_OUTPATIENT_CLINIC_OR_DEPARTMENT_OTHER): Payer: Self-pay | Admitting: *Deleted

## 2011-10-07 ENCOUNTER — Inpatient Hospital Stay (HOSPITAL_BASED_OUTPATIENT_CLINIC_OR_DEPARTMENT_OTHER)
Admission: RE | Admit: 2011-10-07 | Discharge: 2011-10-07 | Disposition: A | Discharge status: Home / Self Care | Payer: Self-pay | Source: Ambulatory Visit | Attending: Cardiology | Admitting: Cardiology

## 2011-10-07 ENCOUNTER — Encounter (HOSPITAL_BASED_OUTPATIENT_CLINIC_OR_DEPARTMENT_OTHER)
Admission: RE | Disposition: A | Discharge status: Home / Self Care | Payer: Self-pay | Source: Ambulatory Visit | Attending: Cardiology

## 2011-10-07 DIAGNOSIS — I2510 Atherosclerotic heart disease of native coronary artery without angina pectoris: Secondary | ICD-10-CM | POA: Insufficient documentation

## 2011-10-07 DIAGNOSIS — I10 Essential (primary) hypertension: Secondary | ICD-10-CM | POA: Insufficient documentation

## 2011-10-07 DIAGNOSIS — F17200 Nicotine dependence, unspecified, uncomplicated: Secondary | ICD-10-CM | POA: Insufficient documentation

## 2011-10-07 DIAGNOSIS — I428 Other cardiomyopathies: Secondary | ICD-10-CM | POA: Insufficient documentation

## 2011-10-07 HISTORY — PX: CARDIAC CATHETERIZATION: SHX172

## 2011-10-07 SURGERY — JV CORONARY ANGIOGRAM
Anesthesia: Moderate Sedation | Laterality: Left

## 2011-10-07 SURGERY — JV LEFT HEART CATHETERIZATION WITH CORONARY ANGIOGRAM
Anesthesia: Moderate Sedation

## 2011-10-07 MED ORDER — SODIUM CHLORIDE 0.9 % IJ SOLN: 3.0000 mL | INTRAMUSCULAR | Status: DC | PRN

## 2011-10-07 MED ORDER — SODIUM CHLORIDE 0.9 % IJ SOLN: 3.0000 mL | Freq: Two times a day (BID) | INTRAMUSCULAR | Status: DC

## 2011-10-07 MED ORDER — ASPIRIN 81 MG PO CHEW
324.0000 mg | CHEWABLE_TABLET | ORAL | Status: AC
  Administered 2011-10-07: 324 mg via ORAL

## 2011-10-07 MED ORDER — ACETAMINOPHEN 325 MG PO TABS: 650.0000 mg | ORAL_TABLET | ORAL | Status: DC | PRN

## 2011-10-07 MED ORDER — SODIUM CHLORIDE 0.9 % IV SOLN
INTRAVENOUS | Status: DC
  Administered 2011-10-07: 07:00:00 via INTRAVENOUS

## 2011-10-07 MED ORDER — SODIUM CHLORIDE 0.9 % IV SOLN: INTRAVENOUS | Status: AC

## 2011-10-07 MED ORDER — ONDANSETRON HCL 4 MG/2ML IJ SOLN: 4.0000 mg | Freq: Four times a day (QID) | INTRAMUSCULAR | Status: DC | PRN

## 2011-10-07 MED ORDER — SODIUM CHLORIDE 0.9 % IV SOLN: 250.0000 mL | INTRAVENOUS | Status: DC | PRN

## 2011-10-07 NOTE — OR Nursing (Signed)
Dr McLean at bedside to discuss results and treatment plan with pt and family 

## 2011-10-07 NOTE — Op Note (Signed)
Cardiac Catheterization Procedure Note  Name: William Mccullough MRN: [id number] DOB: April 15, 1946  Procedure: Left Heart Cath, Selective Coronary Angiography  Indication:  Low EF, exertional chest pain.    Procedural Details: The right wrist was prepped, draped, and anesthetized with 1% lidocaine. Using the modified Seldinger technique, a 5 French sheath was introduced into the right radial artery. 3 mg of verapamil was administered through the sheath, weight-based unfractionated heparin was administered intravenously. The left coronary artery was engaged with the JL-3.5 and the right coronary artery was engaged with the AL-1 catheter. Catheter exchanges were performed over an exchange length guidewire. There were no immediate procedural complications. A TR band was used for radial hemostasis at the completion of the procedure.  The patient was transferred to the post catheterization recovery area for further monitoring.  Procedural Findings: Hemodynamics: AO 133/70 LV 123/13  Coronary angiography: Coronary dominance: right  Left mainstem: 30% distal stenosis.  Left anterior descending (LAD): Diffuse mild luminal irregularities.  30% mid LAD stenosis.    Left circumflex (LCx): Small-caliber ramus with long up to 90% mid-vessel stenosis.  50% mid LCx.  Small OM1 with 80% stenosis.   Right coronary artery (RCA): 50% mid RCA stenosis, 70% distal RCA stenosis.  Small PDA with diffuse up to 80% stenosis.   Left ventriculography: Left ventricular systolic function is normal, LVEF is estimated at 55-65%, there is no significant mitral regurgitation   Intracoronary nitroglycerin did not increase caliber of coronaries appreciably.   Final Conclusions:  Extensive coronary disease.  Most significant lesions were in small branch vessels.  He could certainly be ischemic in the territories of the small ramus, the small OM1, and the small PDA.   These vessels are too small in caliber to easily intervene  upon.  The 70% distal RCA stenosis is borderline and could be intervened upon, but mainly supplies the small, diffusely diseased PDA.  Given stable exertional angina, I think that the best plan here is going to be medical management.   Recommendations:  1. Start statin: use atorvastatin 20 mg daily.  2. Start beta blocker: will use Coreg given systolic dysfunction, 6.25 mg bid.  3. Start Imdur 30 mg daily for angina. 3. He can proceed with prostate surgery but will need to remain on his cardiac medications, especially beta blocker.   William Mccullough 10/07/2011, 8:51 AM

## 2011-10-07 NOTE — OR Nursing (Signed)
Bedrest began at 0900

## 2011-10-07 NOTE — H&P (View-Only) (Signed)
PCP: Georganna Skeans ([facility])  66 yo with history of HTN and smoking presented initially for cardiology evaluation prior to prostate surgery.  He was recently diagnosed with prostate cancer by biopsy and is planned for robotic laparoscopic radical prostatectomy.  He was noted to have very high blood pressure and was therefore sent for cardiology evaluation.  At last appointment, I added amlodipine.  BP is still elevated but improved.    He has been noting chest tightness with heavy exertion such as walking up steps or riding his bike.  This most commonly occurs when he rides his bike.  He will have to stop and get down to rest.  He will then walk his bike rather than trying to ride again.  He also gets short of breath if he climbs steps or walks fast.  He is smoking about 1 ppd.  He has started Wellbutrin and is trying to quit.  Echocardiogram was abnormal with EF 40-45% and diffuse hypokinesis.  ETT-myoview showed poor exercise tolerance and exercise-induced LBBB.  However, perfusion images were normal.   Labs (1/13): K 3.4, creatinine 1.32, LDL 73, HDL 46  PMH: 1. HTN 2. H/o ETOH abuse 3. H/o acute pancreatitis 4. Prostate cancer diagnosed by biopsy 5. Active smoker 6. Cardiomyopathy: Echo (1/13) with EF 40-45%, diffuse hypokinesis, mild LVH, mild MR.  ETT-myoview (1/13): patient only able to exercise 2:04 due to fatigue and dyspnea, also developed LBBB, switched to Lexiscan and developed chest pain with LBBB again; EF 52% with no perfusion defect.   SH: Retired.  Lives with sister.  Smokes 1 ppd.  Prior heavy ETOH, now some beer on weekends.  FH: Nephew had heart surgery.  He does not know about any CAD.   ROS: All systems negative except as per HPI.   Current Outpatient Prescriptions  Medication Sig Dispense Refill  . aspirin 325 MG tablet Take 325 mg by mouth every 4 (four) hours as needed. Pain       . buPROPion (WELLBUTRIN SR) 150 MG 12 hr tablet Take 1 tablet (150 mg total) by  mouth 2 (two) times daily.  60 tablet  2  . cloNIDine (CATAPRES) 0.1 MG tablet Take 0.1 mg by mouth 2 (two) times daily.       Marland Kitchen telmisartan-hydrochlorothiazide (MICARDIS HCT) 80-25 MG per tablet Take 1 tablet by mouth daily after breakfast.       . DISCONTD: amLODipine (NORVASC) 5 MG tablet Take 5 mg by mouth daily before breakfast.      . amLODipine (NORVASC) 10 MG tablet Take 1 tablet (10 mg total) by mouth daily.  30 tablet  6    BP 140/94  Pulse 93  Ht 5\' 9"  (1.753 m)  Wt 57.153 kg (126 lb)  BMI 18.61 kg/m2 General: NAD, thin Neck: No JVD, no thyromegaly or thyroid nodule.  Lungs: Clear to auscultation bilaterally with normal respiratory effort. CV: Nondisplaced PMI.  Heart regular S1/S2, no S3, +S4, no murmur.  No peripheral edema.  No carotid bruit.  Normal pedal pulses.  Abdomen: Soft, nontender, no hepatosplenomegaly, no distention.  Neurologic: Alert and oriented x 3.  Psych: Normal affect. Extremities: No clubbing or cyanosis.

## 2011-10-07 NOTE — OR Nursing (Signed)
Discharge instructions reviewed and signed, pt stated understanding, ambulated in hall without difficulty, site level 0, transported pt to wife's car via wheelchair

## 2011-10-07 NOTE — OR Nursing (Signed)
TR Band removed, site level 0, light pressure dressing applied with velcro wrist splint

## 2011-10-07 NOTE — OR Nursing (Signed)
Meal served 

## 2011-10-07 NOTE — Interval H&P Note (Signed)
History and Physical Interval Note:  10/07/2011 7:52 AM  William Mccullough  has presented today for surgery, with the diagnosis of chest pain, abnormal echo  The various methods of treatment have been discussed with the patient and family. After consideration of risks, benefits and other options for treatment, the patient has consented to  Procedure(s): JV CORONARY ANGIOGRAM as a surgical intervention .  The patients' history has been reviewed, patient examined, no change in status, stable for surgery.  I have reviewed the patients' chart and labs.  Questions were answered to the patient's satisfaction.     William Mccullough [name]

## 2011-10-08 MED FILL — Bupivacaine Inj 0.5% w/ Epinephrine 1:200000: INTRAMUSCULAR | Qty: 50 | Status: AC

## 2011-10-08 MED FILL — Heparin Sodium (Porcine) Inj 1000 Unit/ML: INTRAMUSCULAR | Qty: 1 | Status: AC

## 2011-10-08 MED FILL — Indigotindisulfonate Sodium Inj 8 MG/ML: INTRAMUSCULAR | Qty: 10 | Status: AC

## 2011-10-11 ENCOUNTER — Telehealth: Payer: Self-pay | Admitting: Cardiology

## 2011-10-11 ENCOUNTER — Encounter: Payer: Self-pay | Admitting: Radiation Oncology

## 2011-10-11 NOTE — Telephone Encounter (Signed)
Recent prescriptions were written for brand name Imdur and Coreg. I talked with pharmacy. Pharmacy will give pt generic for Coreg and Imdur. These should be $4 each per pharmacy. Pt is aware

## 2011-10-11 NOTE — Progress Notes (Signed)
66 year old male. Retired. Lives with sister.   Initially elevated for elevated PSA in 2007. He underwent a biopsy August 2007 which did NOT reveal malignancy. He was encouraged to follow up but, did not. He presented to Dr. Laverle Patter in March 2012 because of an increase in his PSA to 16.17 drawn February 2012. Recheck of PSA should increase to 44.94 prompting a repeat biopsy. 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   TNM stage: cT1c Nx M0 3/12 cores--L apex (25%), R apex (less than 5%), R lateral mid (less than 5%) Prostate volume: 36.9 c  Radical prostatectomy was scheduled for 09/13/2011 but patient became extremely hypertensive with BP of over 220/120 and telemetry changes suggest new bundle branch block therefore surgery was not done.   NKDA No indications of a pacemaker No hx of radiation therapy

## 2011-10-11 NOTE — Telephone Encounter (Signed)
Pt has a question regarding his medication. The Pharm called and told him the price of his medication has gone up and patient wants to get generic or would like for you to tell him what he needs to do he uses  [facility] [address]

## 2011-10-12 ENCOUNTER — Ambulatory Visit
Admission: RE | Admit: 2011-10-12 | Discharge: 2011-10-12 | Disposition: A | Discharge status: Home / Self Care | Payer: Self-pay | Source: Ambulatory Visit | Attending: Radiation Oncology | Admitting: Radiation Oncology

## 2011-10-12 ENCOUNTER — Encounter: Payer: Self-pay | Admitting: Radiation Oncology

## 2011-10-12 ENCOUNTER — Telehealth: Payer: Self-pay | Admitting: *Deleted

## 2011-10-12 ENCOUNTER — Other Ambulatory Visit: Payer: Self-pay | Admitting: Radiation Oncology

## 2011-10-12 VITALS — BP 155/93 | HR 105 | Temp 97.1°F | Resp 18 | Wt 130.6 lb

## 2011-10-12 DIAGNOSIS — R351 Nocturia: Secondary | ICD-10-CM | POA: Insufficient documentation

## 2011-10-12 DIAGNOSIS — Z510 Encounter for antineoplastic radiation therapy: Secondary | ICD-10-CM | POA: Insufficient documentation

## 2011-10-12 DIAGNOSIS — R350 Frequency of micturition: Secondary | ICD-10-CM | POA: Insufficient documentation

## 2011-10-12 DIAGNOSIS — C61 Malignant neoplasm of prostate: Secondary | ICD-10-CM

## 2011-10-12 DIAGNOSIS — R197 Diarrhea, unspecified: Secondary | ICD-10-CM | POA: Insufficient documentation

## 2011-10-12 DIAGNOSIS — R300 Dysuria: Secondary | ICD-10-CM | POA: Insufficient documentation

## 2011-10-12 DIAGNOSIS — K5900 Constipation, unspecified: Secondary | ICD-10-CM | POA: Insufficient documentation

## 2011-10-12 DIAGNOSIS — I10 Essential (primary) hypertension: Secondary | ICD-10-CM | POA: Insufficient documentation

## 2011-10-12 DIAGNOSIS — R42 Dizziness and giddiness: Secondary | ICD-10-CM | POA: Insufficient documentation

## 2011-10-12 NOTE — Progress Notes (Signed)
Encounter addended by: Ardell Isaacs, RN on: 10/12/2011  4:30 PM<BR>     Documentation filed: Charges VN

## 2011-10-12 NOTE — Progress Notes (Signed)
Patient presented to the clinic today unaccompanied for a follow up new consult with Dr. Dayton Scrape reference prostate ca. Patient is alert and oriented to person, place, and time. No distress noted. Steady gait noted. Pleasant affect noted. Patient denies pain at this time. Patient denies hematuria or burning upon urination. Patient reports a strong urine stream and complete emptying of his bladder. Patient reports occasional pain with bowel movements. Patient denies nausea, vomiting, diarrhea, or headache. Patient reports that he gets up every hour to void during the night. Patient reports that his weight fluctuates between 126 and 132. Today, this patient weighed 130.6 lb. IPSS = 7. Reported all findings to Dr. Dayton Scrape.

## 2011-10-12 NOTE — Progress Notes (Signed)
Please see the Nurse Progress Note in the MD Initial Consult Encounter for this patient. 

## 2011-10-12 NOTE — Progress Notes (Signed)
Encounter addended by: Ardell Isaacs, RN on: 10/12/2011 10:04 AM<BR>     Documentation filed: Notes Section

## 2011-10-12 NOTE — Progress Notes (Signed)
Followup note:  Diagnosis:  Stage TI C. intermediate risk adenocarcinoma prostate  Requesting physician: Dr. Crecencio Mc  History: William Mccullough is seen today for a followup visit at the request of Dr. Laverle Patter for consideration of radiation therapy in the management of his stage TI C. intermediate risk adenocarcinoma prostate. I first saw William Mccullough in August of 2012 at which time he presented with an elevated PSA of 44.94 and a Gleason score of 6. He elected for surgery, however he had a hypertensive episode resulting in EKG changes before placing him under anesthesia. He was then decided that he may be a better candidate for ration therapy considering his cardiac issues. He does have your frequency is otherwise doing reasonably well from a GU and GI standpoint. His I PSS score is 7. He does have erectile dysfunction. His repeat PSA from 07/19/2011 was down to 13.79.  Physical examination: Head and neck examination grossly unremarkable. Nodes: Without palpable cervical or supraclavicular lymphadenopathy. Chest: Lungs clear. Back: Without spinal or CVA tenderness. Heart: Regular in rhythm. Abdomen: Without masses or organomegaly. Genitalia: Grossly unremarkable to inspection. Rectal: The gland is small and is without focal induration or nodularity. Extremities: Without edema.  Laboratory data: PSA 13.79 from 07/19/2011.  Impression: Stage TI C. intermediate risk adenocarcinoma prostate. He does not appear to have high risk disease based on his previous PSA level and a repeat PSA of 13.79 from 07/19/2011. I again discussed his management options, and I believe that he is best suited for external beam/IMRT. We discussed the potential acute and late toxicities in great detail. We also talked about the need for a comfortably full bladder during his radiation therapy to minimize his urinary toxicity. He was given literature for review. I'll need to have him scheduled to see Dr. Laverle Patter for placement of 3 gold  markers for prostate localization/image guidance. We'll then have him return for simulation/treatment planning. Consent was signed today.  30 minutes was spent face-to-face with the patient, primarily counseling the patient.

## 2011-10-12 NOTE — Progress Notes (Signed)
Encounter addended by: Ardell Isaacs, RN on: 10/12/2011  5:05 PM<BR>     Documentation filed: Inpatient Patient Education

## 2011-10-12 NOTE — Progress Notes (Signed)
Complete NUTRITION RISK SCREEN worksheet without concerns submitted to Barbara Neff, RD. Also, complete PATIENT MEASURE OF DISTRESS worksheet with a score of 0 submitted to social work.  

## 2011-10-20 ENCOUNTER — Telehealth: Payer: Self-pay | Admitting: Radiation Oncology

## 2011-10-20 ENCOUNTER — Ambulatory Visit (INDEPENDENT_AMBULATORY_CARE_PROVIDER_SITE_OTHER): Payer: Self-pay | Admitting: Cardiology

## 2011-10-20 ENCOUNTER — Encounter: Payer: Self-pay | Admitting: Cardiology

## 2011-10-20 DIAGNOSIS — N189 Chronic kidney disease, unspecified: Secondary | ICD-10-CM

## 2011-10-20 DIAGNOSIS — R079 Chest pain, unspecified: Secondary | ICD-10-CM

## 2011-10-20 DIAGNOSIS — I2510 Atherosclerotic heart disease of native coronary artery without angina pectoris: Secondary | ICD-10-CM | POA: Insufficient documentation

## 2011-10-20 DIAGNOSIS — F17200 Nicotine dependence, unspecified, uncomplicated: Secondary | ICD-10-CM | POA: Insufficient documentation

## 2011-10-20 DIAGNOSIS — I10 Essential (primary) hypertension: Secondary | ICD-10-CM

## 2011-10-20 DIAGNOSIS — I428 Other cardiomyopathies: Secondary | ICD-10-CM

## 2011-10-20 LAB — BASIC METABOLIC PANEL
CO2: 26 mEq/L (ref 19–32)
Chloride: 105 mEq/L (ref 96–112)
Potassium: 3.9 mEq/L (ref 3.5–5.1)

## 2011-10-20 MED ORDER — CLONIDINE HCL 0.1 MG PO TABS: 0.1000 mg | ORAL_TABLET | Freq: Two times a day (BID) | ORAL | Status: AC

## 2011-10-20 MED ORDER — AMLODIPINE BESYLATE 10 MG PO TABS: 10.0000 mg | ORAL_TABLET | Freq: Every day | ORAL | Status: AC

## 2011-10-20 MED ORDER — ISOSORBIDE MONONITRATE ER 60 MG PO TB24: 60.0000 mg | ORAL_TABLET | ORAL | Status: AC

## 2011-10-20 MED ORDER — ATORVASTATIN CALCIUM 20 MG PO TABS: 20.0000 mg | ORAL_TABLET | Freq: Every day | ORAL | Status: AC

## 2011-10-20 MED ORDER — TELMISARTAN-HCTZ 80-25 MG PO TABS: 1.0000 | ORAL_TABLET | Freq: Every day | ORAL | Status: AC

## 2011-10-20 MED ORDER — CARVEDILOL 6.25 MG PO TABS: ORAL_TABLET | ORAL | Status: DC

## 2011-10-20 MED ORDER — BUPROPION HCL ER (SR) 150 MG PO TB12: 150.0000 mg | ORAL_TABLET | Freq: Two times a day (BID) | ORAL | Status: AC

## 2011-10-20 NOTE — Telephone Encounter (Signed)
Pt has no insurance and was sent a financial assistance application via mail to complete and return on next appt.  His last EPP approval expired on 09/20/2011  11/04/2011:  Indigent Approved 100% Family Size 1 HH INC 7404.00 MOD POV 11,490.00 Valid Dates 10/21/2011 - 10/20/2012 100% INDIGENT - PLEASE APPLY DISCOUNT TO ANY PRIOR AND ALL CURRENT BILL DOS.  [facility] $400  11/15/2011: Met with patient to discuss RO billing. Pt did express concerns for transportation needs. Ref'd pt to Leotis Shames and Cammy Copa; Clinical Social Workers.   Attending Rad: Dr. Dayton Scrape  Rad Tx:  IMRT [id number]  Dx: 414.00 Coronary atherosclerosis, unspecified type of vessel

## 2011-10-20 NOTE — Assessment & Plan Note (Signed)
BP now appears to be under good control.

## 2011-10-20 NOTE — Progress Notes (Signed)
PCP: Georganna Skeans ([facility])  66 yo with history of HTN and smoking presented initially for cardiology evaluation prior to prostate surgery.  He was recently diagnosed with prostate cancer by biopsy and was planned for robotic laparoscopic radical prostatectomy.  He was noted to have very high blood pressure and was therefore sent for cardiology evaluation. When I saw him, he reported chest tightness with moderate exertion such as walking up steps or riding his bike.  This most commonly occurs when he rides his bike.  He will have to stop and get down to rest.  He also gets short of breath if he climbs steps or walks fast.  Echocardiogram was abnormal with EF 40-45% and diffuse hypokinesis.  ETT-myoview showed poor exercise tolerance and exercise-induced LBBB.  However, perfusion images were normal.   Left heart cath was done in 1/13, showing significant distal and branch vessel CAD that was not amenable to intervention.  I started him on statin, Coreg, and Imdur.  BP is now controlled.  He continues to have chest discomfort when riding his bike.  He is still smoking but is cutting down.  He is now planned for prostate radiation rather than surgery.   Labs (1/13): K 3.4, creatinine 1.32 => 1.6, LDL 73, HDL 46  PMH: 1. HTN 2. H/o ETOH abuse 3. H/o acute pancreatitis 4. Prostate cancer diagnosed by biopsy 5. Active smoker 6. Ischemic cardiomyopathy: Echo (1/13) with EF 40-45%, diffuse hypokinesis, mild LVH, mild MR.  ETT-myoview (1/13): patient only able to exercise 2:04 due to fatigue and dyspnea, also developed LBBB, switched to Lexiscan and developed chest pain with LBBB again; EF 52% with no perfusion defect.  7. CAD: LHC (1/13) with 90% mid ramus (small vessel), 80% small OM1, 50% mCFX, 50% mRCA, 70% dRCA, small PDA with diffuse up to 80% stenosis.  Plan for medical management (no good interventional target).   SH: Retired.  Lives with sister.  Smokes 1 ppd.  Prior heavy ETOH, now some beer  on weekends.  FH: Nephew had heart surgery.  He does not know about any CAD.   ROS: All systems negative except as per HPI.   Current Outpatient Prescriptions  Medication Sig Dispense Refill  . DISCONTD: amLODipine (NORVASC) 10 MG tablet Take 1 tablet (10 mg total) by mouth daily.  30 tablet  6  . DISCONTD: aspirin 325 MG tablet Take 325 mg by mouth every 4 (four) hours as needed. Pain       . DISCONTD: buPROPion (WELLBUTRIN SR) 150 MG 12 hr tablet Take 1 tablet (150 mg total) by mouth 2 (two) times daily.  60 tablet  2  . DISCONTD: cloNIDine (CATAPRES) 0.1 MG tablet Take 0.1 mg by mouth 2 (two) times daily.       Marland Kitchen DISCONTD: telmisartan-hydrochlorothiazide (MICARDIS HCT) 80-25 MG per tablet Take 1 tablet by mouth daily after breakfast.       . amLODipine (NORVASC) 10 MG tablet Take 1 tablet (10 mg total) by mouth daily.  30 tablet  11  . aspirin (ASPIRIN CHILDRENS) 81 MG chewable tablet Chew 1 tablet (81 mg total) by mouth daily.      Marland Kitchen atorvastatin (LIPITOR) 20 MG tablet Take 1 tablet (20 mg total) by mouth daily.  30 tablet  11  . buPROPion (WELLBUTRIN SR) 150 MG 12 hr tablet Take 1 tablet (150 mg total) by mouth 2 (two) times daily.  60 tablet  2  . carvedilol (COREG) 6.25 MG tablet Take 1.5 tablets(total 9.375mg )  twice a day  90 tablet  11  . cloNIDine (CATAPRES) 0.1 MG tablet Take 1 tablet (0.1 mg total) by mouth 2 (two) times daily.  60 tablet  11  . isosorbide mononitrate (IMDUR) 60 MG 24 hr tablet Take 1 tablet (60 mg total) by mouth every morning.  30 tablet  11  . telmisartan-hydrochlorothiazide (MICARDIS HCT) 80-25 MG per tablet Take 1 tablet by mouth daily.  30 tablet  11    BP 131/81  Pulse 66  Ht 5\' 9"  (1.753 m)  Wt 56.246 kg (124 lb)  BMI 18.31 kg/m2 General: NAD, thin Neck: No JVD, no thyromegaly or thyroid nodule.  Lungs: Clear to auscultation bilaterally with normal respiratory effort. CV: Nondisplaced PMI.  Heart regular S1/S2, no S3, +S4, no murmur.  No peripheral  edema.  No carotid bruit.  Normal pedal pulses.  Abdomen: Soft, nontender, no hepatosplenomegaly, no distention.  Neurologic: Alert and oriented x 3.  Psych: Normal affect. Extremities: No clubbing or cyanosis.

## 2011-10-20 NOTE — Assessment & Plan Note (Signed)
Check BMET today 

## 2011-10-20 NOTE — Assessment & Plan Note (Signed)
Ischemic, EF 40-45% by echo.  He is on ARB and titrating up Coreg today.

## 2011-10-20 NOTE — Assessment & Plan Note (Signed)
Distal and branch vessel CAD.  No target for intervention.  He has chronic stable angina with moderate exertion. - Continue ASA but can decrease dose to 81 mg daily.  - Continue statin (atorvastatin 20) with lipids in 2 months. - Increase Coreg to 9.375 mg bid.  - Increase Imdur to 60 mg daily. - Continue ARB.

## 2011-10-20 NOTE — Patient Instructions (Addendum)
Increase coreg(carvedilol) to 9.375mg  twice a day. This will be one and one-half 6.25mg  tablets twice a day.  Increase Imdur(isosorbide mononitrate) to 60mg  daily. You can take two 30mg  tablets daily.  Decrease aspirin to 81mg  daily.  Your physician recommends that you have  lab work today--BMET  Your physician recommends that you return for a FASTING lipid profile /liver profile in 2 months   Your physician recommends that you schedule a follow-up appointment in: 1 month with Dr Shirlee Latch.

## 2011-10-20 NOTE — Assessment & Plan Note (Signed)
I strongly encouraged him to quit smoking.  He is on Wellbutrin.  I gave him the number for the [facility] smoking cessation classes.

## 2011-10-29 NOTE — Telephone Encounter (Signed)
xxx

## 2011-11-01 ENCOUNTER — Ambulatory Visit: Payer: Self-pay

## 2011-11-01 ENCOUNTER — Ambulatory Visit: Payer: Self-pay | Admitting: Radiation Oncology

## 2011-11-05 ENCOUNTER — Telehealth: Payer: Self-pay | Admitting: *Deleted

## 2011-11-05 NOTE — Telephone Encounter (Signed)
XXXX 

## 2011-11-08 ENCOUNTER — Ambulatory Visit: Payer: Self-pay

## 2011-11-08 ENCOUNTER — Ambulatory Visit: Payer: Self-pay | Admitting: Radiation Oncology

## 2011-11-15 ENCOUNTER — Ambulatory Visit
Admission: RE | Admit: 2011-11-15 | Discharge: 2011-11-15 | Disposition: A | Discharge status: Home / Self Care | Payer: Self-pay | Source: Ambulatory Visit | Attending: Radiation Oncology | Admitting: Radiation Oncology

## 2011-11-15 DIAGNOSIS — C61 Malignant neoplasm of prostate: Secondary | ICD-10-CM

## 2011-11-15 NOTE — Progress Notes (Signed)
Simulation/treatment planning note  The patient was taken to the CT simulator. An alpha cradle was constructed for immobilization. A red rubber tube was placed within the rectal vault. He was then catheterized and contrast instilled into the bladder/urethra. I chose and isocenter within the center of the prostate. I contoured his prostate, rectum, bladder, and seminal vesicles. Dosimetry will contour his remaining bowel and femoral heads. I'm prescribing 7800 cGy in 40 sessions to his prostate PTV which represents the prostate +0.8 cm except for 0.5 cm along the rectum. The seminal vesical PTV represents a seminal vesicles +0.5 cm. This will receive 5600 cGy in 40 sessions. He'll be treated with a comfortably full bladder. I requesting daily cone beam CT since one of the seeds appears to be within the rectal wall time concerned about migration. He is now ready for IMRT simulation/planning.

## 2011-11-18 ENCOUNTER — Encounter: Payer: Self-pay | Admitting: Cardiology

## 2011-11-18 ENCOUNTER — Encounter: Payer: Self-pay | Admitting: Radiation Oncology

## 2011-11-18 ENCOUNTER — Ambulatory Visit (INDEPENDENT_AMBULATORY_CARE_PROVIDER_SITE_OTHER): Payer: Self-pay | Admitting: Cardiology

## 2011-11-18 VITALS — BP 130/76 | HR 72 | Ht 69.0 in | Wt 130.8 lb

## 2011-11-18 DIAGNOSIS — F17200 Nicotine dependence, unspecified, uncomplicated: Secondary | ICD-10-CM

## 2011-11-18 DIAGNOSIS — I2510 Atherosclerotic heart disease of native coronary artery without angina pectoris: Secondary | ICD-10-CM

## 2011-11-18 DIAGNOSIS — I5022 Chronic systolic (congestive) heart failure: Secondary | ICD-10-CM

## 2011-11-18 DIAGNOSIS — I428 Other cardiomyopathies: Secondary | ICD-10-CM

## 2011-11-18 DIAGNOSIS — I10 Essential (primary) hypertension: Secondary | ICD-10-CM

## 2011-11-18 MED ORDER — CARVEDILOL 12.5 MG PO TABS
12.5000 mg | ORAL_TABLET | Freq: Two times a day (BID) | ORAL | Status: AC
Start: 2011-11-18 — End: 2012-11-17

## 2011-11-18 NOTE — Patient Instructions (Signed)
Increase coreg(carvedilol) to 12.5mg  twice a day. You can take two 6.25mg  tablets twice a day.   Your physician recommends that you return for a FASTING lipid profile 12/15/11.  Your physician wants you to follow-up in: 4 months with Dr Shirlee Latch. (July 2013).You will receive a reminder letter in the mail two months in advance. If you don't receive a letter, please call our office to schedule the follow-up appointment.

## 2011-11-18 NOTE — Progress Notes (Signed)
PCP: Georganna Skeans ([facility])  66 yo with history of HTN and smoking presented initially for cardiology evaluation prior to prostate surgery.  He was recently diagnosed with prostate cancer by biopsy and was planned for robotic laparoscopic radical prostatectomy.  He was noted to have very high blood pressure and was therefore sent for cardiology evaluation. When I saw him, he reported chest tightness with moderate exertion such as walking up steps or riding his bike.  This most commonly occurs when he rides his bike.  He will have to stop and get down to rest.  He also would get short of breath if he climbed steps or walked fast.  Echocardiogram was abnormal with EF 40-45% and diffuse hypokinesis.  ETT-myoview showed poor exercise tolerance and exercise-induced LBBB.  However, perfusion images were normal.   Left heart cath was done in 1/13, showing significant distal and branch vessel CAD that was not amenable to intervention.  I started him on statin, Coreg, and Imdur.  BP is now under good control.  He is going to undergo radiation to treat his prostate cancer rather than surgery.  He is still smoking.  He is taking Wellbutrin.  He is doing better with medical therapy.  No chest pain with walking and less chest pain with riding his bike.  Still gets rare episodes of mild CP riding his bike.   Labs (1/13): K 3.4, creatinine 1.32 => 1.6, LDL 73, HDL 46 Labs (2/13): K 3.9, creatinine 1.1  PMH: 1. HTN 2. H/o ETOH abuse 3. H/o acute pancreatitis 4. Prostate cancer diagnosed by biopsy: plan for radiation treatment.  5. Active smoker 6. Ischemic cardiomyopathy: Echo (1/13) with EF 40-45%, diffuse hypokinesis, mild LVH, mild MR.  ETT-myoview (1/13): patient only able to exercise 2:04 due to fatigue and dyspnea, also developed LBBB, switched to Lexiscan and developed chest pain with LBBB again; EF 52% with no perfusion defect.  7. CAD: LHC (1/13) with 90% mid ramus (small vessel), 80% small OM1, 50%  mCFX, 50% mRCA, 70% dRCA, small PDA with diffuse up to 80% stenosis.  Plan for medical management (no good interventional target).   SH: Retired.  Lives with sister.  Smokes 1 ppd.  Prior heavy ETOH, now some beer on weekends.  FH: Nephew had heart surgery.  He does not know about any CAD.   ROS: All systems negative except as per HPI.   Current Outpatient Prescriptions  Medication Sig Dispense Refill  . amLODipine (NORVASC) 10 MG tablet Take 1 tablet (10 mg total) by mouth daily.  30 tablet  11  . atorvastatin (LIPITOR) 20 MG tablet Take 1 tablet (20 mg total) by mouth daily.  30 tablet  11  . buPROPion (WELLBUTRIN SR) 150 MG 12 hr tablet Take 1 tablet (150 mg total) by mouth 2 (two) times daily.  60 tablet  2  . cloNIDine (CATAPRES) 0.1 MG tablet Take 1 tablet (0.1 mg total) by mouth 2 (two) times daily.  60 tablet  11  . isosorbide mononitrate (IMDUR) 60 MG 24 hr tablet Take 1 tablet (60 mg total) by mouth every morning.  30 tablet  11  . telmisartan-hydrochlorothiazide (MICARDIS HCT) 80-25 MG per tablet Take 1 tablet by mouth daily.  30 tablet  11  . DISCONTD: carvedilol (COREG) 6.25 MG tablet Take 1.5 tablets(total 9.375mg ) twice a day  90 tablet  11  . carvedilol (COREG) 12.5 MG tablet Take 1 tablet (12.5 mg total) by mouth 2 (two) times daily.  180 tablet  3    BP 130/76  Pulse 72  Ht 5\' 9"  (1.753 m)  Wt 130 lb 12.8 oz (59.33 kg)  BMI 19.32 kg/m2 General: NAD, thin Neck: No JVD, no thyromegaly or thyroid nodule.  Lungs: Clear to auscultation bilaterally with normal respiratory effort. CV: Nondisplaced PMI.  Heart regular S1/S2, no S3, +S4, no murmur.  No peripheral edema.  No carotid bruit.  Normal pedal pulses.  Abdomen: Soft, nontender, no hepatosplenomegaly, no distention.  Neurologic: Alert and oriented x 3.  Psych: Normal affect. Extremities: No clubbing or cyanosis.

## 2011-11-18 NOTE — Progress Notes (Signed)
IMRT simulation/treatment planning note:  The patient underwent IMRT simulation/treatment planning in the management of his carcinoma the prostate. IMRT was chosen to reduce the risk for both acute and late bladder and rectal toxicity compared to conventional or 3-D conformal radiation therapy. He's been treated with 2 modulated arcs. We met our departmental goals with respect to his prostate PTV and seminal vesicle PTV. We also met our departmental  avoidance structures with respect to his bladder, rectum, and femoral heads. Please see the EMR for specific dose volume histograms. He is to be treated with comfortably full bladder, and will have him undergo daily conebeam CT setting up 3 gold seeds. It is noted that one of the posterior seeds is probably within the rectal wall.

## 2011-11-18 NOTE — Assessment & Plan Note (Signed)
Ischemic, EF 40-45% by echo.  Euvolemic with NYHA class II symptoms.  He is on ARB.  Increasing Coreg today.

## 2011-11-18 NOTE — Assessment & Plan Note (Signed)
BP now appears to be under good control.  

## 2011-11-18 NOTE — Assessment & Plan Note (Signed)
Distal and branch vessel CAD.  No target for intervention.  He has chronic stable angina that has improved with medication adjustment. . - Continue ASA 81 - Continue statin (atorvastatin 20) with lipids in 4/13 - Increase Coreg to 12.5 mg bid.  - Continue ARB and Imdur.

## 2011-11-18 NOTE — Assessment & Plan Note (Signed)
I strongly encouraged him to quit smoking.  He is on Wellbutrin.  He is going to try the [facility] smoking cessation classes.

## 2011-11-24 ENCOUNTER — Encounter: Payer: Self-pay | Admitting: *Deleted

## 2011-11-24 ENCOUNTER — Ambulatory Visit
Admission: RE | Admit: 2011-11-24 | Discharge: 2011-11-24 | Disposition: A | Discharge status: Home / Self Care | Payer: Self-pay | Source: Ambulatory Visit | Attending: Radiation Oncology | Admitting: Radiation Oncology

## 2011-11-24 VITALS — Wt 130.1 lb

## 2011-11-24 DIAGNOSIS — C61 Malignant neoplasm of prostate: Secondary | ICD-10-CM

## 2011-11-24 NOTE — Progress Notes (Signed)
Received patient in the clinic today following initial radiation treatment for post sim education with Sam, RN. Patient is alert and oriented to person, place, and time. No distress noted. Steady gait noted. Pleasant affect noted. Patient denies pain at this time. Oriented patient to staff and routine of the clinic. Provided patient with RADIATION THERAPY AND YOU handbook then, reviewed pertinent information. Educated patient on potential side effects and management. Provided patient with this writer's business card and encouraged to call with needs. Patient verbalized understanding of all things reviewed.

## 2011-11-24 NOTE — Progress Notes (Signed)
Clinical social work received referral from Merla Riches, financial advocate, for transportation concerns. CSW met with patient before radonc appointment today and discussed patient's needs. Patient's sister is currently bringing him, but is unable to get to all appointments. CSW provided patient with 30 day bus pass and notified Ms. Green to take necessary amount out of his [employer].  CSW will also make referral to [facility] for transportation assistance. Patient plans to contact CSW with any additional questions or concerns.  Kathrin Penner, MSW, [facility] Clinical Social Worker [facility] [phone]

## 2011-11-25 ENCOUNTER — Ambulatory Visit
Admission: RE | Admit: 2011-11-25 | Discharge: 2011-11-25 | Disposition: A | Discharge status: Home / Self Care | Payer: Self-pay | Source: Ambulatory Visit | Attending: Radiation Oncology | Admitting: Radiation Oncology

## 2011-11-26 ENCOUNTER — Ambulatory Visit
Admission: RE | Admit: 2011-11-26 | Discharge: 2011-11-26 | Disposition: A | Discharge status: Home / Self Care | Payer: Self-pay | Source: Ambulatory Visit | Attending: Radiation Oncology | Admitting: Radiation Oncology

## 2011-11-29 ENCOUNTER — Ambulatory Visit
Admission: RE | Admit: 2011-11-29 | Discharge: 2011-11-29 | Disposition: A | Discharge status: Home / Self Care | Payer: Self-pay | Source: Ambulatory Visit | Attending: Radiation Oncology | Admitting: Radiation Oncology

## 2011-11-29 ENCOUNTER — Encounter: Payer: Self-pay | Admitting: Radiation Oncology

## 2011-11-29 VITALS — BP 139/82 | HR 67 | Resp 18 | Wt 133.3 lb

## 2011-11-29 DIAGNOSIS — C61 Malignant neoplasm of prostate: Secondary | ICD-10-CM

## 2011-11-29 NOTE — Progress Notes (Signed)
Weekly Management Note:  Site:Prostate Current Dose:  780  cGy Projected Dose: 7800  cGy  Narrative: The patient is seen today for routine under treatment assessment. CBCT/MVCT images/port films were reviewed. The chart was reviewed.   Good bladder filling. No GU or GI difficulties.  Physical Examination:  Filed Vitals:   11/29/11 1730  BP: 139/82  Pulse: 67  Resp: 18  .  Weight: 133 lb 4.8 oz (60.464 kg). No change.  Impression: Tolerating radiation therapy well.  Plan: Continue radiation therapy as planned.

## 2011-11-29 NOTE — Progress Notes (Signed)
Patient presents to the clinic today for an under treat visit with Dr. Dayton Scrape. Patient is alert and oriented to person, place, and time. No distress noted. Steady gait noted. Pleasant affect noted. Patient denies pain at this time. Patient denies nausea, vomiting, headache, dizziness, diarrhea or constipation. Patient reports dry mouth. Patient denies hematuria or burning with urination. Patient reports getting up on average four times per night to void. Reported all findings to Dr. Dayton Scrape.

## 2011-11-30 ENCOUNTER — Ambulatory Visit
Admission: RE | Admit: 2011-11-30 | Discharge: 2011-11-30 | Disposition: A | Discharge status: Home / Self Care | Payer: Self-pay | Source: Ambulatory Visit | Attending: Radiation Oncology | Admitting: Radiation Oncology

## 2011-12-01 ENCOUNTER — Ambulatory Visit
Admission: RE | Admit: 2011-12-01 | Discharge: 2011-12-01 | Disposition: A | Discharge status: Home / Self Care | Payer: Self-pay | Source: Ambulatory Visit | Attending: Radiation Oncology | Admitting: Radiation Oncology

## 2011-12-02 ENCOUNTER — Ambulatory Visit
Admission: RE | Admit: 2011-12-02 | Discharge: 2011-12-02 | Disposition: A | Discharge status: Home / Self Care | Payer: Self-pay | Source: Ambulatory Visit | Attending: Radiation Oncology | Admitting: Radiation Oncology

## 2011-12-03 ENCOUNTER — Ambulatory Visit
Admission: RE | Admit: 2011-12-03 | Discharge: 2011-12-03 | Disposition: A | Discharge status: Home / Self Care | Payer: Self-pay | Source: Ambulatory Visit | Attending: Radiation Oncology | Admitting: Radiation Oncology

## 2011-12-06 ENCOUNTER — Ambulatory Visit
Admission: RE | Admit: 2011-12-06 | Discharge: 2011-12-06 | Disposition: A | Discharge status: Home / Self Care | Payer: Self-pay | Source: Ambulatory Visit | Attending: Radiation Oncology | Admitting: Radiation Oncology

## 2011-12-06 ENCOUNTER — Encounter: Payer: Self-pay | Admitting: Radiation Oncology

## 2011-12-06 DIAGNOSIS — C61 Malignant neoplasm of prostate: Secondary | ICD-10-CM

## 2011-12-06 NOTE — Progress Notes (Signed)
   Weekly Management Note, prostate cancer Current Dose:   1755 cGy  Projected Dose:  7800 cGy   Narrative:  The patient presents for routine under treatment assessment.  CBCT/MVCT images/Port film x-rays were reviewed.  The chart was checked. He is doing well. He denies dysuria. He has no significant nocturia; he vomited once this weekend: it was a solitary incident. He has constipation.  Physical Findings: Weight: 125 pounds   . He is in no acute distress.  Impression:  The patient is tolerating radiotherapy.  Plan:  Continue radiotherapy as planned. I recommended docusate sodium for his constipation. He will let us know if his vomiting becomes a recurrent incident.

## 2011-12-06 NOTE — Progress Notes (Signed)
HERE FOR PUT.  C/O CONSTIPATION AND HAS NOT HAD A BM BUT ONCE IN 2 WEEKS.  HAS HAD A WT LOSS OF 8 LBS SINCE LAST WEEK.  WILL WRITE DOWN A STOOL SOFTENER AND LAXATIVE FOR HIM TO GET OTC

## 2011-12-07 ENCOUNTER — Ambulatory Visit: Payer: Self-pay

## 2011-12-08 ENCOUNTER — Ambulatory Visit
Admission: RE | Admit: 2011-12-08 | Discharge: 2011-12-08 | Disposition: A | Discharge status: Home / Self Care | Payer: Self-pay | Source: Ambulatory Visit | Attending: Radiation Oncology | Admitting: Radiation Oncology

## 2011-12-09 ENCOUNTER — Ambulatory Visit
Admission: RE | Admit: 2011-12-09 | Discharge: 2011-12-09 | Disposition: A | Discharge status: Home / Self Care | Payer: Self-pay | Source: Ambulatory Visit | Attending: Radiation Oncology | Admitting: Radiation Oncology

## 2011-12-10 ENCOUNTER — Ambulatory Visit
Admission: RE | Admit: 2011-12-10 | Discharge: 2011-12-10 | Disposition: A | Discharge status: Home / Self Care | Payer: Self-pay | Source: Ambulatory Visit | Attending: Radiation Oncology | Admitting: Radiation Oncology

## 2011-12-10 ENCOUNTER — Encounter: Payer: Self-pay | Admitting: *Deleted

## 2011-12-10 NOTE — Progress Notes (Signed)
Clinical Social Worker received referral from [employer] after pt rounding.  Crystal reported that the pt had questions regarding transportation and Medicare.  Kathrin Penner, CSW met with pt on 11/24/11 and addressed many of these concerns.  This CSW met with pt after his tx to assess for any additional needs.  Pt has been provided a 31 day bus pass and a referral has been made to [facility].  Pt stated he was comfortable using the bus, and his sister was also able to provide transportation when needed.  Pt stated he could not recall receiving a phone call form [facility] for transportation assistance.  CSW provided pt with [employer] contact information and instructions on how to call and set up transportation through the [employer] volunteer program.  CSW also provided pt with information on [employer] (volunteer transportation assistance); which provided multiple resources in addition to transportation.  Pt was appreciative of information and stated he planned to make contact.   Pt is currently uninsured, but is enrolled in the [facility] care network program through DSS.  CSW and pt discussed Medicare and the application process.  Pt informed CSW that he has been receiving his retirement benefit since he turned 34.  Because pt has been receiving his retirement benefit since the age of 69 (1 year, 8 months) he may be eligible to apply for Medicare with in the next 3 months.  CSW encouraged pt to call or make an appointment with the Social Security Office to discuss applying for Medicare and clarify his benefits and eligibility.  CSW provided pt with the contact information and location of the Social Security office.  Pt verbalized understanding and stated he would inform CSW after he made contact with Social Security.  CSW encouraged pt to call with any questions or concerns.    Tamala Julian, MSW, LCSW Clinical Social Worker [facility] [phone]

## 2011-12-13 ENCOUNTER — Encounter: Payer: Self-pay | Admitting: Radiation Oncology

## 2011-12-13 ENCOUNTER — Ambulatory Visit
Admission: RE | Admit: 2011-12-13 | Discharge: 2011-12-13 | Disposition: A | Discharge status: Home / Self Care | Payer: Self-pay | Source: Ambulatory Visit | Attending: Radiation Oncology | Admitting: Radiation Oncology

## 2011-12-13 VITALS — BP 148/84 | HR 79 | Resp 18 | Wt 127.7 lb

## 2011-12-13 DIAGNOSIS — C61 Malignant neoplasm of prostate: Secondary | ICD-10-CM

## 2011-12-13 NOTE — Progress Notes (Signed)
Weekly Management Note:  Site:Prostate Current Dose:  2535  cGy Projected Dose: 7800  cGy  Narrative: The patient is seen today for routine under treatment assessment. CBCT/MVCT images/port films were reviewed. The chart was reviewed.   Bladder filling is less than satisfactory. The LINAC was running late and he had to urinate. He recently had constipation, told that he would probably need to take a stool softener. He does have occasional dribbling and "spraying" of his urinary stream. This has been chronic. Physical Examination:  Filed Vitals:   12/13/11 1742  BP: 148/84  Pulse: 79  Resp: 18  .  Weight: 127 lb 11.2 oz (57.924 kg). No change.  Impression: Tolerating radiation therapy well.  Plan: Continue radiation therapy as planned.

## 2011-12-13 NOTE — Progress Notes (Signed)
Patient presents to the clinic today unaccompanied for under treat visit with Dr. Dayton Scrape. Patient is alert and oriented to person, place, and time.  No distress noted. Steady gait noted. Pleasant affect noted. Patient denies pain at this time. Patient reports that two days ago he saw scant blood in his urine but none to date. Patient reports that his urine stream alternates between strong, "dribbles, and sprays." Patient reports that constipation has resolved since using magnesium citrate. Patient reports that he is up each hour during the night to void. Patient reports occasional burning with urination. Reported all findings to Dr. Dayton Scrape.

## 2011-12-14 ENCOUNTER — Ambulatory Visit
Admission: RE | Admit: 2011-12-14 | Discharge: 2011-12-14 | Disposition: A | Discharge status: Home / Self Care | Payer: Self-pay | Source: Ambulatory Visit | Attending: Radiation Oncology | Admitting: Radiation Oncology

## 2011-12-15 ENCOUNTER — Other Ambulatory Visit: Payer: Self-pay

## 2011-12-15 ENCOUNTER — Ambulatory Visit: Payer: Self-pay

## 2011-12-16 ENCOUNTER — Ambulatory Visit
Admission: RE | Admit: 2011-12-16 | Discharge: 2011-12-16 | Disposition: A | Discharge status: Home / Self Care | Payer: Self-pay | Source: Ambulatory Visit | Attending: Radiation Oncology | Admitting: Radiation Oncology

## 2011-12-17 ENCOUNTER — Ambulatory Visit
Admission: RE | Admit: 2011-12-17 | Discharge: 2011-12-17 | Disposition: A | Discharge status: Home / Self Care | Payer: Self-pay | Source: Ambulatory Visit | Attending: Radiation Oncology | Admitting: Radiation Oncology

## 2011-12-17 VITALS — BP 93/62 | HR 75 | Temp 97.8°F | Wt 126.5 lb

## 2011-12-17 DIAGNOSIS — C61 Malignant neoplasm of prostate: Secondary | ICD-10-CM

## 2011-12-17 NOTE — Progress Notes (Signed)
William Mccullough arrived today c/o dizziness which started "today".  hypotentsion noted.  BP 92/58, pulse 75 sitting.  BP 93/62, Pulse 75 standing.  Pt on Norvasc, Micardis HCT, Catapres, Coreg and Imdur.  His PC is Careers adviser.  Transported by his sister today.

## 2011-12-17 NOTE — Progress Notes (Signed)
   Department of Radiation Oncology  Phone:  [phone] Fax:        [phone]  Weekly Treatment Note    Name: William Mccullough Date: 12/17/2011 MRN: [id number] DOB: 21-May-1946   Current fraction: 16   MEDICATIONS: Current Outpatient Prescriptions  Medication Sig Dispense Refill  . amLODipine (NORVASC) 10 MG tablet Take 1 tablet (10 mg total) by mouth daily.  30 tablet  11  . atorvastatin (LIPITOR) 20 MG tablet Take 1 tablet (20 mg total) by mouth daily.  30 tablet  11  . buPROPion (WELLBUTRIN SR) 150 MG 12 hr tablet Take 1 tablet (150 mg total) by mouth 2 (two) times daily.  60 tablet  2  . carvedilol (COREG) 12.5 MG tablet Take 1 tablet (12.5 mg total) by mouth 2 (two) times daily.  180 tablet  3  . cloNIDine (CATAPRES) 0.1 MG tablet Take 1 tablet (0.1 mg total) by mouth 2 (two) times daily.  60 tablet  11  . isosorbide mononitrate (IMDUR) 60 MG 24 hr tablet Take 1 tablet (60 mg total) by mouth every morning.  30 tablet  11  . telmisartan-hydrochlorothiazide (MICARDIS HCT) 80-25 MG per tablet Take 1 tablet by mouth daily.  30 tablet  11     ALLERGIES: Review of patient's allergies indicates no known allergies.   LABORATORY DATA:  Lab Results  Component Value Date   WBC 9.6 10/01/2011   HGB 16.1 10/01/2011   HCT 47.9 10/01/2011   MCV 90.3 10/01/2011   PLT 254.0 10/01/2011   Lab Results  Component Value Date   NA 137 10/20/2011   K 3.9 10/20/2011   CL 105 10/20/2011   CO2 26 10/20/2011   Lab Results  Component Value Date   ALT 17 10/23/2010   AST 22 10/23/2010   ALKPHOS 104 10/23/2010   BILITOT 0.8 10/23/2010     NARRATIVE: [name] was seen today for weekly treatment management. The chart was checked and the patient's films were reviewed. The patient is seen today complaining of some dizziness. He is brought around the clinic and is found to have some hypertension. Vitals did not really changed much upon standing but his blood pressure at resting was low at 92/58.  The patient states that the dizziness is worse upon standing however. He has been experiencing a little bit of diarrhea.  PHYSICAL EXAMINATION: weight is 126 lb 8 oz (57.38 kg). His temperature is 97.8 F (36.6 C). His blood pressure is 93/62 and his pulse is 75.        ASSESSMENT: The patient is doing satisfactorily with treatment.  PLAN: We will continue with the patient's radiation treatment as planned. The patient is to begin Imodium if he needs to to control his diarrhea. I encouraged the patient to drink plenty of fluids. I do believe that this would help some. He is also on a number of blood pressure medications and he is to contact his primary care physician if his dizziness continues to see what changes they would recommend.

## 2011-12-20 ENCOUNTER — Ambulatory Visit
Admission: RE | Admit: 2011-12-20 | Discharge: 2011-12-20 | Disposition: A | Discharge status: Home / Self Care | Payer: Self-pay | Source: Ambulatory Visit | Attending: Radiation Oncology | Admitting: Radiation Oncology

## 2011-12-21 ENCOUNTER — Ambulatory Visit
Admission: RE | Admit: 2011-12-21 | Discharge: 2011-12-21 | Disposition: A | Discharge status: Home / Self Care | Payer: Self-pay | Source: Ambulatory Visit | Attending: Radiation Oncology | Admitting: Radiation Oncology

## 2011-12-22 ENCOUNTER — Ambulatory Visit
Admission: RE | Admit: 2011-12-22 | Discharge: 2011-12-22 | Disposition: A | Discharge status: Home / Self Care | Payer: Self-pay | Source: Ambulatory Visit | Attending: Radiation Oncology | Admitting: Radiation Oncology

## 2011-12-22 ENCOUNTER — Ambulatory Visit: Payer: Self-pay | Admitting: Lab

## 2011-12-22 ENCOUNTER — Encounter: Payer: Self-pay | Admitting: Radiation Oncology

## 2011-12-22 VITALS — BP 134/84 | HR 72 | Resp 18 | Wt 127.8 lb

## 2011-12-22 DIAGNOSIS — C61 Malignant neoplasm of prostate: Secondary | ICD-10-CM

## 2011-12-22 LAB — URINALYSIS, MICROSCOPIC - CHCC
Blood: NEGATIVE
Glucose: NEGATIVE g/dL
Ketones: NEGATIVE mg/dL
Specific Gravity, Urine: 1.005 (ref 1.003–1.035)
WBC, UA: NEGATIVE (ref 0–2)

## 2011-12-22 NOTE — Progress Notes (Signed)
Patient presents to the clinic today for an under treat visit with Dr. Dayton Scrape. Patient is alert and oriented to person, place, and time. No distress noted. Steady gait noted. Pleasant affect noted. Patient denies pain at this time. Patient denies diarrhea. Patient reports that his appetite comes and goes. One pound weight gain in a week noted. Patient reports that he is getting up between 10-15 times per night to void. Patient reports burning with urination. Patient reports a strong urine stream. Patient denies hematuria. Patient states,"I feel much better than last week when I had nausea and passed out." Reported all findings to Dr. Dayton Scrape.

## 2011-12-22 NOTE — Progress Notes (Signed)
Weekly Management Note:  Site:Prostate Current Dose:  3705  cGy Projected Dose: 7800  cGy  Narrative: The patient is seen today for routine under treatment assessment. CBCT/MVCT images/port films were reviewed. The chart was reviewed.   Bladder filling less than ideal today. Having more urinary frequency and slowing of urinary stream. Notes that urine may be cloudy with odor.  No GI problems.  Physical Examination:  Filed Vitals:   12/22/11 1102  BP: 134/84  Pulse: 72  Resp: 18  .  Weight: 127 lb 12.8 oz (57.97 kg). No change.  Impression: Tolerating radiation therapy well although need to rule out UTI.   Plan: Continue radiation therapy as planned. Will get UA/culture, and start on Flomax (tamsulosin) and Pyridium.

## 2011-12-23 ENCOUNTER — Ambulatory Visit
Admission: RE | Admit: 2011-12-23 | Discharge: 2011-12-23 | Disposition: A | Discharge status: Home / Self Care | Payer: Self-pay | Source: Ambulatory Visit | Attending: Radiation Oncology | Admitting: Radiation Oncology

## 2011-12-24 ENCOUNTER — Ambulatory Visit: Payer: Self-pay

## 2011-12-25 LAB — URINE CULTURE

## 2011-12-27 ENCOUNTER — Ambulatory Visit
Admission: RE | Admit: 2011-12-27 | Discharge: 2011-12-27 | Disposition: A | Discharge status: Home / Self Care | Payer: Self-pay | Source: Ambulatory Visit | Attending: Radiation Oncology | Admitting: Radiation Oncology

## 2011-12-27 ENCOUNTER — Encounter: Payer: Self-pay | Admitting: Radiation Oncology

## 2011-12-27 VITALS — BP 132/78 | HR 66 | Resp 18 | Wt 130.2 lb

## 2011-12-27 DIAGNOSIS — C61 Malignant neoplasm of prostate: Secondary | ICD-10-CM

## 2011-12-27 NOTE — Progress Notes (Signed)
Weekly Management Note:  Site:Prostate Current Dose:  4095  cGy Projected Dose: 7800  cGy  Narrative: The patient is seen today for routine under treatment assessment. CBCT/MVCT images/port films were reviewed. The chart was reviewed. Bladder filling less than optimal today approximately 60% fold. Images were reviewed with the patient.  His urinary frequency and dysuria are markedly improved after starting Flomax and Pyridium. His urinalysis and culture were negative for infection. No GI symptoms.  Physical Examination:  Filed Vitals:   12/27/11 1050  BP: 132/78  Pulse: 66  Resp: 18  .  Weight: 130 lb 3.2 oz (59.058 kg). No change.  Impression: Tolerating radiation therapy well. Better bladder filling is encouraged.  Plan: Continue radiation therapy as planned.

## 2011-12-27 NOTE — Progress Notes (Signed)
UA and C/S report handed to Dr. Dayton Scrape for review.

## 2011-12-27 NOTE — Progress Notes (Signed)
Patient presents to the clinic today unaccompanied for under treat visit with Dr. Dayton Scrape. Patient is alert and oriented to person, place, and time. No distress noted. Steady gait noted. Pleasant affect noted. Patient denies pain at this time. Patient reports a weak urine stream with amber color urine. Patient denies burning with urination since Friday. Patient reports that on average now he is getting up 3-4 times per night to void which is an improvement from every 30 minutes to an hour. Patient denies hematuria. Patient reports that he continues to take pyridium and flomax as directed. Reported all findings to Dr. Dayton Scrape.

## 2011-12-27 NOTE — Progress Notes (Signed)
Encounter addended by: Agnes Lawrence, RN on: 12/27/2011  1:27 PM<BR>     Documentation filed: Notes Section

## 2011-12-28 ENCOUNTER — Ambulatory Visit
Admission: RE | Admit: 2011-12-28 | Discharge: 2011-12-28 | Disposition: A | Discharge status: Home / Self Care | Payer: Self-pay | Source: Ambulatory Visit | Attending: Radiation Oncology | Admitting: Radiation Oncology

## 2011-12-29 ENCOUNTER — Ambulatory Visit: Payer: Self-pay

## 2011-12-30 ENCOUNTER — Ambulatory Visit
Admission: RE | Admit: 2011-12-30 | Discharge: 2011-12-30 | Disposition: A | Discharge status: Home / Self Care | Payer: Self-pay | Source: Ambulatory Visit | Attending: Radiation Oncology | Admitting: Radiation Oncology

## 2011-12-31 ENCOUNTER — Ambulatory Visit
Admission: RE | Admit: 2011-12-31 | Discharge: 2011-12-31 | Disposition: A | Discharge status: Home / Self Care | Payer: Self-pay | Source: Ambulatory Visit | Attending: Radiation Oncology | Admitting: Radiation Oncology

## 2012-01-03 ENCOUNTER — Encounter: Payer: Self-pay | Admitting: Radiation Oncology

## 2012-01-03 ENCOUNTER — Ambulatory Visit
Admission: RE | Admit: 2012-01-03 | Discharge: 2012-01-03 | Disposition: A | Discharge status: Home / Self Care | Payer: Self-pay | Source: Ambulatory Visit | Attending: Radiation Oncology | Admitting: Radiation Oncology

## 2012-01-03 VITALS — BP 153/88 | HR 65 | Resp 18 | Wt 131.5 lb

## 2012-01-03 DIAGNOSIS — C61 Malignant neoplasm of prostate: Secondary | ICD-10-CM

## 2012-01-03 NOTE — Progress Notes (Signed)
Weekly Management Note:  Site:Prostate Current Dose:  4875  cGy Projected Dose: 7800  cGy  Narrative: The patient is seen today for routine under treatment assessment. CBCT/MVCT images/port films were reviewed. The chart was reviewed.   Bladder filling today by conebeam CT for image guidance, is satisfactory. He still having a significant amount of urinary frequency although his dysuria is improved on Pyridium. He tells me he takes Flomax twice a day and still has nocturia as often as 10-20 at times a night. His stream is often weak. Recent urinalysis was without evidence for infection. No GI difficulties.  Physical Examination:  Filed Vitals:   01/03/12 1111  BP: 153/88  Pulse: 65  Resp: 18  .  Weight: 131 lb 8 oz (59.648 kg). No change.  Impression: Tolerating radiation therapy well, however, he is urinary frequency/nocturia is worsening. I'm not sure if he has a component of irritable bladder that may benefit from an antispasmodic. I told the patient that I would like for him to contact Dr. Laverle Patter to see if he should undergo a voiding study to see if he is able to empty his bladder.  Plan: Continue radiation therapy as planned. I will contact Dr. Laverle Patter for his advice.

## 2012-01-03 NOTE — Progress Notes (Signed)
Received patient in the clinic today for an under treat visit with Dr. Dayton Scrape. Patient is alert and oriented to person, place, and time. No distress noted. Steady gait noted. Pleasant affect noted. Patient denies pain at this time. Patient taking pyridium and flomax as directed. Patient reports burning with urination has stopped. Patient reports voiding 20 times per night on average. Patient reports this urinary frequency is effecting his sleep and causing his energy level to decrease slightly. Patient denies hematuria. Patient reports a weak stream. Reported all findings to Dr. Dayton Scrape.

## 2012-01-04 ENCOUNTER — Ambulatory Visit
Admission: RE | Admit: 2012-01-04 | Discharge: 2012-01-04 | Disposition: A | Discharge status: Home / Self Care | Payer: Self-pay | Source: Ambulatory Visit | Attending: Radiation Oncology | Admitting: Radiation Oncology

## 2012-01-05 ENCOUNTER — Ambulatory Visit
Admission: RE | Admit: 2012-01-05 | Discharge: 2012-01-05 | Disposition: A | Discharge status: Home / Self Care | Payer: Self-pay | Source: Ambulatory Visit | Attending: Radiation Oncology | Admitting: Radiation Oncology

## 2012-01-06 ENCOUNTER — Ambulatory Visit
Admission: RE | Admit: 2012-01-06 | Discharge: 2012-01-06 | Disposition: A | Discharge status: Home / Self Care | Payer: Self-pay | Source: Ambulatory Visit | Attending: Radiation Oncology | Admitting: Radiation Oncology

## 2012-01-07 ENCOUNTER — Ambulatory Visit
Admission: RE | Admit: 2012-01-07 | Discharge: 2012-01-07 | Disposition: A | Discharge status: Home / Self Care | Payer: Self-pay | Source: Ambulatory Visit | Attending: Radiation Oncology | Admitting: Radiation Oncology

## 2012-01-10 ENCOUNTER — Ambulatory Visit
Admission: RE | Admit: 2012-01-10 | Discharge: 2012-01-10 | Disposition: A | Discharge status: Home / Self Care | Payer: Self-pay | Source: Ambulatory Visit | Attending: Radiation Oncology | Admitting: Radiation Oncology

## 2012-01-10 ENCOUNTER — Encounter: Payer: Self-pay | Admitting: Radiation Oncology

## 2012-01-10 VITALS — BP 147/92 | HR 73 | Temp 98.2°F | Resp 20 | Wt 129.0 lb

## 2012-01-10 DIAGNOSIS — I10 Essential (primary) hypertension: Secondary | ICD-10-CM | POA: Insufficient documentation

## 2012-01-10 DIAGNOSIS — R197 Diarrhea, unspecified: Secondary | ICD-10-CM | POA: Insufficient documentation

## 2012-01-10 DIAGNOSIS — C61 Malignant neoplasm of prostate: Secondary | ICD-10-CM | POA: Insufficient documentation

## 2012-01-10 DIAGNOSIS — R300 Dysuria: Secondary | ICD-10-CM | POA: Insufficient documentation

## 2012-01-10 DIAGNOSIS — R351 Nocturia: Secondary | ICD-10-CM | POA: Insufficient documentation

## 2012-01-10 DIAGNOSIS — R350 Frequency of micturition: Secondary | ICD-10-CM | POA: Insufficient documentation

## 2012-01-10 DIAGNOSIS — K5900 Constipation, unspecified: Secondary | ICD-10-CM | POA: Insufficient documentation

## 2012-01-10 DIAGNOSIS — R42 Dizziness and giddiness: Secondary | ICD-10-CM | POA: Insufficient documentation

## 2012-01-10 DIAGNOSIS — Z510 Encounter for antineoplastic radiation therapy: Secondary | ICD-10-CM | POA: Insufficient documentation

## 2012-01-10 NOTE — Progress Notes (Signed)
Weekly Management Note:  Site:Prostate Current Dose:  5850  cGy Projected Dose: 7800  cGy  Narrative: The patient is seen today for routine under treatment assessment. CBCT/MVCT images/port films were reviewed. The chart was reviewed.   Bladder filling today is suboptimal. He tells me that he had difficulty feeling his bladder today. He does report continued urinary frequency with nocturia every one to 2 hours. He continues with his Flomax. He believes that he saw some blood in his stool this morning, he states that he's had this in the past with a history of hemorrhoids.  Physical Examination:  Filed Vitals:   01/10/12 1053  BP: 147/92  Pulse: 73  Temp: 98.2 F (36.8 C)  Resp: 20  .  Weight: 129 lb (58.514 kg). No change.  Impression: Tolerating radiation therapy well, although he does have some degree of cystitis and probably proctitis to explain his symptoms. I encouraged him to keep it comfortable full bladder as best as possible.  Plan: Continue radiation therapy as planned.

## 2012-01-10 NOTE — Progress Notes (Signed)
Pt reports he "saw blood in his stool this morning, was slightly diarrhea". He denies having this happen in past. States he no longer has pain w/urination, less daytime urinary frequency, nocturia q 1-2 hrs. Cont w/Flomax but states he has not noticed his urinary freq decreasing.

## 2012-01-11 ENCOUNTER — Ambulatory Visit
Admission: RE | Admit: 2012-01-11 | Discharge: 2012-01-11 | Disposition: A | Discharge status: Home / Self Care | Payer: Self-pay | Source: Ambulatory Visit | Attending: Radiation Oncology | Admitting: Radiation Oncology

## 2012-01-12 ENCOUNTER — Ambulatory Visit
Admission: RE | Admit: 2012-01-12 | Discharge: 2012-01-12 | Disposition: A | Discharge status: Home / Self Care | Payer: Self-pay | Source: Ambulatory Visit | Attending: Radiation Oncology | Admitting: Radiation Oncology

## 2012-01-13 ENCOUNTER — Ambulatory Visit
Admission: RE | Admit: 2012-01-13 | Discharge: 2012-01-13 | Disposition: A | Discharge status: Home / Self Care | Payer: Self-pay | Source: Ambulatory Visit | Attending: Radiation Oncology | Admitting: Radiation Oncology

## 2012-01-14 ENCOUNTER — Ambulatory Visit
Admission: RE | Admit: 2012-01-14 | Discharge: 2012-01-14 | Disposition: A | Discharge status: Home / Self Care | Payer: Self-pay | Source: Ambulatory Visit | Attending: Radiation Oncology | Admitting: Radiation Oncology

## 2012-01-17 ENCOUNTER — Ambulatory Visit
Admission: RE | Admit: 2012-01-17 | Discharge: 2012-01-17 | Disposition: A | Discharge status: Home / Self Care | Payer: Self-pay | Source: Ambulatory Visit | Attending: Radiation Oncology | Admitting: Radiation Oncology

## 2012-01-17 ENCOUNTER — Encounter: Payer: Self-pay | Admitting: Radiation Oncology

## 2012-01-17 VITALS — Wt 131.2 lb

## 2012-01-17 DIAGNOSIS — C61 Malignant neoplasm of prostate: Secondary | ICD-10-CM

## 2012-01-17 NOTE — Progress Notes (Signed)
Weekly Management Note:  Site:Prostate Current Dose:  6825  cGy Projected Dose: 7800  cGy  Narrative: The patient is seen today for routine under treatment assessment. CBCT/MVCT images/port films were reviewed. The chart was reviewed.   Satisfactory bladder filling. No new GU or GI difficulties.  Physical Examination: There were no vitals filed for this visit..  Weight: 131 lb 3.2 oz (59.512 kg). No change.  Impression: Tolerating radiation therapy well.  Plan: Continue radiation therapy as planned. One more week of radiation therapy.

## 2012-01-17 NOTE — Progress Notes (Signed)
Pt cont to have nocturia q 1-2 hrs but states this is less than it had been. States BMs normal, no diarrhea, no blood noted.  Denies dysuria and freq during day.

## 2012-01-18 ENCOUNTER — Ambulatory Visit
Admission: RE | Admit: 2012-01-18 | Discharge: 2012-01-18 | Disposition: A | Discharge status: Home / Self Care | Payer: Self-pay | Source: Ambulatory Visit | Attending: Radiation Oncology | Admitting: Radiation Oncology

## 2012-01-19 ENCOUNTER — Ambulatory Visit
Admission: RE | Admit: 2012-01-19 | Discharge: 2012-01-19 | Disposition: A | Discharge status: Home / Self Care | Payer: Self-pay | Source: Ambulatory Visit | Attending: Radiation Oncology | Admitting: Radiation Oncology

## 2012-01-20 ENCOUNTER — Ambulatory Visit
Admission: RE | Admit: 2012-01-20 | Discharge: 2012-01-20 | Disposition: A | Discharge status: Home / Self Care | Payer: Self-pay | Source: Ambulatory Visit | Attending: Radiation Oncology | Admitting: Radiation Oncology

## 2012-01-21 ENCOUNTER — Ambulatory Visit
Admission: RE | Admit: 2012-01-21 | Discharge: 2012-01-21 | Disposition: A | Discharge status: Home / Self Care | Payer: Self-pay | Source: Ambulatory Visit | Attending: Radiation Oncology | Admitting: Radiation Oncology

## 2012-01-24 ENCOUNTER — Encounter: Payer: Self-pay | Admitting: Radiation Oncology

## 2012-01-24 ENCOUNTER — Ambulatory Visit
Admission: RE | Admit: 2012-01-24 | Discharge: 2012-01-24 | Disposition: A | Discharge status: Home / Self Care | Payer: Self-pay | Source: Ambulatory Visit | Attending: Radiation Oncology | Admitting: Radiation Oncology

## 2012-01-24 VITALS — BP 141/82 | HR 62 | Wt 133.2 lb

## 2012-01-24 DIAGNOSIS — C61 Malignant neoplasm of prostate: Secondary | ICD-10-CM

## 2012-01-24 NOTE — Progress Notes (Signed)
Pt denies dysuria, still reports urinary freq, nocturia q 1 hr. He states "sometimes it's better, sometimes it's not". Pt does have some fatigue, no loss of appetite. C/o diarrhea 2-3 x/day w/blood on tissue. This has ongoing x few weeks. Pt has hx hemorrhoids, had surgery. Advised Imodium if he cont to have diarrhea > 3 liquid/day. Pt states he had "bad headache" last Friday evening x 1 hr. Pt did not take any OTC for this, states it went away on it's own.

## 2012-01-24 NOTE — Progress Notes (Signed)
Chart note: On 11/24/2011 William Mccullough and began his IMRT. He was treated with 2 sets of modulated arcs corresponding to one set of IMRT treatment devices [phone]).

## 2012-01-24 NOTE — Progress Notes (Signed)
Weekly Management Note:  Site:Prostate Current Dose:  7800  cGy Projected Dose: 7800  cGy  Narrative: The patient is seen today for routine under treatment assessment. CBCT/MVCT images/port films were reviewed. The chart was reviewed.  Bladder filling less than satisfactory today. His dysuria is much improved. His diarrhea is also improved. His force of stream is satisfactory.  Physical Examination:  Filed Vitals:   01/24/12 1120  BP: 141/82  Pulse: 62  .  Weight: 133 lb 3.2 oz (60.419 kg). No change  Impression: Radiation therapy has been well tolerated.  Plan: Followup visit with me in one month.

## 2012-01-24 NOTE — Progress Notes (Signed)
[facility] Radiation Oncology End of Treatment Note  Name:William Mccullough  Date: 01/24/2012 JWJ:[id number] DOB:[birth year]   Status:outpatient    CC: Georganna Skeans, MD, MD  Dr. Heloise Purpura  REFERRING PHYSICIAN: Dr. Heloise Purpura      DIAGNOSIS: Stage TI C. intermediate risk adenocarcinoma prostate   INDICATION FOR TREATMENT: Curative   TREATMENT DATES: 11/24/2011 through 01/24/2012                          SITE/DOSE:   Prostate 7800 cGy 40 sessions, seminal vesicles 5000 60 cGy 40 sessions                         BEAMS/ENERGY:  6 MV photons modulated dual arc  IMRT.                NARRATIVE: The patient tolerated his treatment reasonably well although he did have some urinary frequency and slowing of his urinary stream for which he took Flomax daily. He also had intermittent loosening of his bowels during his course of treatment.                           PLAN: Routine followup in one month. Patient instructed to call if questions or worsening complaints in interim.

## 2012-02-22 ENCOUNTER — Encounter: Payer: Self-pay | Admitting: Radiation Oncology

## 2012-02-22 DIAGNOSIS — Z923 Personal history of irradiation: Secondary | ICD-10-CM | POA: Insufficient documentation

## 2012-02-23 ENCOUNTER — Ambulatory Visit
Admission: RE | Admit: 2012-02-23 | Discharge: 2012-02-23 | Disposition: A | Discharge status: Home / Self Care | Payer: Self-pay | Source: Ambulatory Visit | Attending: Radiation Oncology | Admitting: Radiation Oncology

## 2012-02-23 ENCOUNTER — Encounter: Payer: Self-pay | Admitting: Radiation Oncology

## 2012-02-23 VITALS — BP 155/93 | HR 71 | Temp 98.1°F | Resp 20 | Wt 133.6 lb

## 2012-02-23 DIAGNOSIS — C61 Malignant neoplasm of prostate: Secondary | ICD-10-CM

## 2012-02-23 NOTE — Progress Notes (Signed)
A followup note:  [name] and returns today approximately 1 month following completion of radiation therapy in the management of his stage TI C. intermediate risk adenocarcinoma prostate. He continues to have urinary frequency, but this is slightly improved. He urinates only 3-4 times during the day but up to 10-15 times a night. He is on Flomax but no antispasmodic medication. He sees Dr. Laverle Patter again in August for a followup visit and PSA determination. He does have occasional loose bowels for which he takes Imodium.  Physical examination: Alert and oriented. Vital signs: Wt Readings from Last 3 Encounters:  02/23/12 133 lb 9.6 oz (60.601 kg)  01/24/12 133 lb 3.2 oz (60.419 kg)  01/17/12 131 lb 3.2 oz (59.512 kg)   Temp Readings from Last 3 Encounters:  02/23/12 98.1 F (36.7 C) Oral  01/10/12 98.2 F (36.8 C) Oral  12/17/11 97.8 F (36.6 C)    BP Readings from Last 3 Encounters:  02/23/12 155/93  01/24/12 141/82  01/10/12 147/92   Pulse Readings from Last 3 Encounters:  02/23/12 71  01/24/12 62  01/10/12 73   Rectal examination not performed.  Impression: Satisfactory progress, but he still has significant bladder irritability, particularly at night. I wonder if you would benefit from a nighttime antispasmodic medication. I defer to Dr. Laverle Patter.   Plan: He'll see Dr. Laverle Patter again in August.  I ask that Dr. Laverle Patter keep  me posted on his progress.

## 2012-02-23 NOTE — Progress Notes (Signed)
Nocturia cont to be very freq; pt states 15 times nightly, less during day but freq. Pt unable to quantify but states "it seems abnormal, is aggravating." Denies dysuria, is still on Pyridium.  Pt reports freq soft bm's w/urgency. Has not taken Imodium; advised he take this.  Pt reports appetite improving, no fatigue, rides his bike often.

## 2012-03-29 ENCOUNTER — Encounter: Payer: Self-pay | Admitting: *Deleted

## 2013-07-30 ENCOUNTER — Encounter (INDEPENDENT_AMBULATORY_CARE_PROVIDER_SITE_OTHER): Payer: Self-pay | Admitting: General Surgery

## 2013-08-01 ENCOUNTER — Ambulatory Visit (INDEPENDENT_AMBULATORY_CARE_PROVIDER_SITE_OTHER): Payer: 59 | Admitting: General Surgery

## 2013-08-16 ENCOUNTER — Ambulatory Visit (INDEPENDENT_AMBULATORY_CARE_PROVIDER_SITE_OTHER): Payer: 59 | Admitting: General Surgery

## 2013-08-23 ENCOUNTER — Ambulatory Visit (INDEPENDENT_AMBULATORY_CARE_PROVIDER_SITE_OTHER): Payer: 59 | Admitting: General Surgery

## 2015-11-05 ENCOUNTER — Inpatient Hospital Stay (HOSPITAL_COMMUNITY)
Admission: EM | Admit: 2015-11-05 | Discharge: 2015-12-06 | DRG: 853 | Disposition: E | Payer: Medicare Other | Attending: Pulmonary Disease | Admitting: Pulmonary Disease

## 2015-11-05 ENCOUNTER — Emergency Department (HOSPITAL_COMMUNITY): Payer: Medicare Other

## 2015-11-05 DIAGNOSIS — R6521 Severe sepsis with septic shock: Secondary | ICD-10-CM | POA: Diagnosis present

## 2015-11-05 DIAGNOSIS — R042 Hemoptysis: Secondary | ICD-10-CM | POA: Diagnosis not present

## 2015-11-05 DIAGNOSIS — R4182 Altered mental status, unspecified: Secondary | ICD-10-CM | POA: Diagnosis not present

## 2015-11-05 DIAGNOSIS — J189 Pneumonia, unspecified organism: Secondary | ICD-10-CM

## 2015-11-05 DIAGNOSIS — Z66 Do not resuscitate: Secondary | ICD-10-CM | POA: Diagnosis not present

## 2015-11-05 DIAGNOSIS — G931 Anoxic brain damage, not elsewhere classified: Secondary | ICD-10-CM | POA: Diagnosis not present

## 2015-11-05 DIAGNOSIS — J9690 Respiratory failure, unspecified, unspecified whether with hypoxia or hypercapnia: Secondary | ICD-10-CM

## 2015-11-05 DIAGNOSIS — R403 Persistent vegetative state: Secondary | ICD-10-CM | POA: Diagnosis present

## 2015-11-05 DIAGNOSIS — Z515 Encounter for palliative care: Secondary | ICD-10-CM | POA: Diagnosis not present

## 2015-11-05 DIAGNOSIS — N170 Acute kidney failure with tubular necrosis: Secondary | ICD-10-CM | POA: Diagnosis present

## 2015-11-05 DIAGNOSIS — I6340 Cerebral infarction due to embolism of unspecified cerebral artery: Secondary | ICD-10-CM | POA: Diagnosis not present

## 2015-11-05 DIAGNOSIS — J9601 Acute respiratory failure with hypoxia: Secondary | ICD-10-CM | POA: Diagnosis present

## 2015-11-05 DIAGNOSIS — Z79899 Other long term (current) drug therapy: Secondary | ICD-10-CM

## 2015-11-05 DIAGNOSIS — I5021 Acute systolic (congestive) heart failure: Secondary | ICD-10-CM | POA: Diagnosis not present

## 2015-11-05 DIAGNOSIS — I130 Hypertensive heart and chronic kidney disease with heart failure and stage 1 through stage 4 chronic kidney disease, or unspecified chronic kidney disease: Secondary | ICD-10-CM | POA: Diagnosis present

## 2015-11-05 DIAGNOSIS — I214 Non-ST elevation (NSTEMI) myocardial infarction: Secondary | ICD-10-CM | POA: Diagnosis not present

## 2015-11-05 DIAGNOSIS — E873 Alkalosis: Secondary | ICD-10-CM | POA: Diagnosis not present

## 2015-11-05 DIAGNOSIS — Z923 Personal history of irradiation: Secondary | ICD-10-CM

## 2015-11-05 DIAGNOSIS — I5023 Acute on chronic systolic (congestive) heart failure: Secondary | ICD-10-CM | POA: Diagnosis not present

## 2015-11-05 DIAGNOSIS — E440 Moderate protein-calorie malnutrition: Secondary | ICD-10-CM | POA: Diagnosis not present

## 2015-11-05 DIAGNOSIS — Z4509 Encounter for adjustment and management of other cardiac device: Secondary | ICD-10-CM

## 2015-11-05 DIAGNOSIS — R402212 Coma scale, best verbal response, none, at arrival to emergency department: Secondary | ICD-10-CM | POA: Diagnosis not present

## 2015-11-05 DIAGNOSIS — R402142 Coma scale, eyes open, spontaneous, at arrival to emergency department: Secondary | ICD-10-CM | POA: Diagnosis present

## 2015-11-05 DIAGNOSIS — L8990 Pressure ulcer of unspecified site, unspecified stage: Secondary | ICD-10-CM | POA: Insufficient documentation

## 2015-11-05 DIAGNOSIS — N179 Acute kidney failure, unspecified: Secondary | ICD-10-CM | POA: Diagnosis not present

## 2015-11-05 DIAGNOSIS — J13 Pneumonia due to Streptococcus pneumoniae: Secondary | ICD-10-CM | POA: Diagnosis present

## 2015-11-05 DIAGNOSIS — I469 Cardiac arrest, cause unspecified: Secondary | ICD-10-CM | POA: Diagnosis present

## 2015-11-05 DIAGNOSIS — R402312 Coma scale, best motor response, none, at arrival to emergency department: Secondary | ICD-10-CM | POA: Diagnosis present

## 2015-11-05 DIAGNOSIS — Z7982 Long term (current) use of aspirin: Secondary | ICD-10-CM

## 2015-11-05 DIAGNOSIS — F419 Anxiety disorder, unspecified: Secondary | ICD-10-CM | POA: Diagnosis present

## 2015-11-05 DIAGNOSIS — R570 Cardiogenic shock: Secondary | ICD-10-CM | POA: Diagnosis not present

## 2015-11-05 DIAGNOSIS — R069 Unspecified abnormalities of breathing: Secondary | ICD-10-CM | POA: Diagnosis not present

## 2015-11-05 DIAGNOSIS — D696 Thrombocytopenia, unspecified: Secondary | ICD-10-CM | POA: Diagnosis not present

## 2015-11-05 DIAGNOSIS — Z978 Presence of other specified devices: Secondary | ICD-10-CM

## 2015-11-05 DIAGNOSIS — K7200 Acute and subacute hepatic failure without coma: Secondary | ICD-10-CM | POA: Diagnosis not present

## 2015-11-05 DIAGNOSIS — G9340 Encephalopathy, unspecified: Secondary | ICD-10-CM | POA: Insufficient documentation

## 2015-11-05 DIAGNOSIS — R739 Hyperglycemia, unspecified: Secondary | ICD-10-CM | POA: Diagnosis present

## 2015-11-05 DIAGNOSIS — Z452 Encounter for adjustment and management of vascular access device: Secondary | ICD-10-CM

## 2015-11-05 DIAGNOSIS — Z833 Family history of diabetes mellitus: Secondary | ICD-10-CM

## 2015-11-05 DIAGNOSIS — E785 Hyperlipidemia, unspecified: Secondary | ICD-10-CM | POA: Diagnosis present

## 2015-11-05 DIAGNOSIS — Z8546 Personal history of malignant neoplasm of prostate: Secondary | ICD-10-CM

## 2015-11-05 DIAGNOSIS — D6489 Other specified anemias: Secondary | ICD-10-CM | POA: Diagnosis not present

## 2015-11-05 DIAGNOSIS — J810 Acute pulmonary edema: Secondary | ICD-10-CM | POA: Diagnosis not present

## 2015-11-05 DIAGNOSIS — R1110 Vomiting, unspecified: Secondary | ICD-10-CM | POA: Diagnosis not present

## 2015-11-05 DIAGNOSIS — E872 Acidosis: Secondary | ICD-10-CM | POA: Diagnosis not present

## 2015-11-05 DIAGNOSIS — Z8249 Family history of ischemic heart disease and other diseases of the circulatory system: Secondary | ICD-10-CM

## 2015-11-05 DIAGNOSIS — Z681 Body mass index (BMI) 19 or less, adult: Secondary | ICD-10-CM

## 2015-11-05 DIAGNOSIS — N183 Chronic kidney disease, stage 3 (moderate): Secondary | ICD-10-CM | POA: Diagnosis present

## 2015-11-05 DIAGNOSIS — I255 Ischemic cardiomyopathy: Secondary | ICD-10-CM | POA: Diagnosis present

## 2015-11-05 DIAGNOSIS — J9691 Respiratory failure, unspecified with hypoxia: Secondary | ICD-10-CM | POA: Diagnosis present

## 2015-11-05 DIAGNOSIS — R34 Anuria and oliguria: Secondary | ICD-10-CM | POA: Diagnosis present

## 2015-11-05 DIAGNOSIS — A419 Sepsis, unspecified organism: Principal | ICD-10-CM | POA: Diagnosis present

## 2015-11-05 DIAGNOSIS — I447 Left bundle-branch block, unspecified: Secondary | ICD-10-CM | POA: Diagnosis present

## 2015-11-05 DIAGNOSIS — F329 Major depressive disorder, single episode, unspecified: Secondary | ICD-10-CM | POA: Diagnosis present

## 2015-11-05 DIAGNOSIS — Z4682 Encounter for fitting and adjustment of non-vascular catheter: Secondary | ICD-10-CM | POA: Diagnosis not present

## 2015-11-05 DIAGNOSIS — F17210 Nicotine dependence, cigarettes, uncomplicated: Secondary | ICD-10-CM | POA: Diagnosis present

## 2015-11-05 DIAGNOSIS — F1010 Alcohol abuse, uncomplicated: Secondary | ICD-10-CM | POA: Diagnosis present

## 2015-11-05 DIAGNOSIS — J9600 Acute respiratory failure, unspecified whether with hypoxia or hypercapnia: Secondary | ICD-10-CM | POA: Insufficient documentation

## 2015-11-05 DIAGNOSIS — I97110 Postprocedural cardiac insufficiency following cardiac surgery: Secondary | ICD-10-CM

## 2015-11-05 DIAGNOSIS — I2510 Atherosclerotic heart disease of native coronary artery without angina pectoris: Secondary | ICD-10-CM | POA: Diagnosis present

## 2015-11-05 DIAGNOSIS — I639 Cerebral infarction, unspecified: Secondary | ICD-10-CM

## 2015-11-05 LAB — I-STAT TROPONIN, ED: TROPONIN I, POC: 0.43 ng/mL — AB (ref 0.00–0.08)

## 2015-11-05 LAB — COMPREHENSIVE METABOLIC PANEL
ALBUMIN: 2.8 g/dL — AB (ref 3.5–5.0)
ALT: 52 U/L (ref 17–63)
AST: 166 U/L — AB (ref 15–41)
Alkaline Phosphatase: 109 U/L (ref 38–126)
Anion gap: 15 (ref 5–15)
BILIRUBIN TOTAL: 0.7 mg/dL (ref 0.3–1.2)
BUN: 13 mg/dL (ref 6–20)
CHLORIDE: 108 mmol/L (ref 101–111)
CO2: 19 mmol/L — AB (ref 22–32)
Calcium: 8.3 mg/dL — ABNORMAL LOW (ref 8.9–10.3)
Creatinine, Ser: 1.9 mg/dL — ABNORMAL HIGH (ref 0.61–1.24)
GFR calc Af Amer: 40 mL/min — ABNORMAL LOW (ref >60)
GFR calc non Af Amer: 35 mL/min — ABNORMAL LOW (ref >60)
GLUCOSE: 200 mg/dL — AB (ref 65–99)
POTASSIUM: 4.1 mmol/L (ref 3.5–5.1)
Sodium: 142 mmol/L (ref 135–145)
TOTAL PROTEIN: 5.4 g/dL — AB (ref 6.5–8.1)

## 2015-11-05 LAB — I-STAT ARTERIAL BLOOD GAS, ED
Acid-base deficit: 13 mmol/L — ABNORMAL HIGH (ref 0.0–2.0)
Bicarbonate: 17.4 mEq/L — ABNORMAL LOW (ref 20.0–24.0)
O2 SAT: 79 %
PCO2 ART: 61.5 mmHg — AB (ref 35.0–45.0)
PH ART: 7.06 — AB (ref 7.350–7.450)
PO2 ART: 62 mmHg — AB (ref 80.0–100.0)
Patient temperature: 98.6
TCO2: 19 mmol/L (ref 0–100)

## 2015-11-05 LAB — I-STAT CG4 LACTIC ACID, ED: LACTIC ACID, VENOUS: 6.46 mmol/L — AB (ref 0.5–2.0)

## 2015-11-05 LAB — CBC WITH DIFFERENTIAL/PLATELET
Basophils Absolute: 0 10*3/uL (ref 0.0–0.1)
Basophils Relative: 0 %
EOS ABS: 0.1 10*3/uL (ref 0.0–0.7)
Eosinophils Relative: 0 %
HCT: 37.7 % — ABNORMAL LOW (ref 39.0–52.0)
HEMOGLOBIN: 12.9 g/dL — AB (ref 13.0–17.0)
LYMPHS ABS: 3.7 10*3/uL (ref 0.7–4.0)
LYMPHS PCT: 17 %
MCH: 28.8 pg (ref 26.0–34.0)
MCHC: 34.2 g/dL (ref 30.0–36.0)
MCV: 84.2 fL (ref 78.0–100.0)
Monocytes Absolute: 0.7 10*3/uL (ref 0.1–1.0)
Monocytes Relative: 3 %
NEUTROS PCT: 80 %
Neutro Abs: 16.5 10*3/uL — ABNORMAL HIGH (ref 1.7–7.7)
Platelets: 175 10*3/uL (ref 150–400)
RBC: 4.48 MIL/uL (ref 4.22–5.81)
RDW: 13.9 % (ref 11.5–15.5)
WBC: 20.9 10*3/uL — AB (ref 4.0–10.5)

## 2015-11-05 LAB — I-STAT CHEM 8, ED
BUN: 18 mg/dL (ref 6–20)
BUN: 19 mg/dL (ref 6–20)
CALCIUM ION: 1.01 mmol/L — AB (ref 1.13–1.30)
CHLORIDE: 109 mmol/L (ref 101–111)
CHLORIDE: 107 mmol/L (ref 101–111)
CREATININE: 1.7 mg/dL — AB (ref 0.61–1.24)
Calcium, Ion: 1.04 mmol/L — ABNORMAL LOW (ref 1.13–1.30)
Creatinine, Ser: 1.6 mg/dL — ABNORMAL HIGH (ref 0.61–1.24)
GLUCOSE: 199 mg/dL — AB (ref 65–99)
Glucose, Bld: 201 mg/dL — ABNORMAL HIGH (ref 65–99)
HCT: 44 % (ref 39.0–52.0)
HEMATOCRIT: 46 % (ref 39.0–52.0)
Hemoglobin: 15 g/dL (ref 13.0–17.0)
Hemoglobin: 15.6 g/dL (ref 13.0–17.0)
POTASSIUM: 5 mmol/L (ref 3.5–5.1)
Potassium: 5.2 mmol/L — ABNORMAL HIGH (ref 3.5–5.1)
SODIUM: 136 mmol/L (ref 135–145)
SODIUM: 139 mmol/L (ref 135–145)
TCO2: 20 mmol/L (ref 0–100)
TCO2: 18 mmol/L (ref 0–100)

## 2015-11-05 LAB — PROTIME-INR
INR: 1.22 (ref 0.00–1.49)
Prothrombin Time: 15.6 seconds — ABNORMAL HIGH (ref 11.6–15.2)

## 2015-11-05 MED ORDER — NOREPINEPHRINE BITARTRATE 1 MG/ML IV SOLN
0.0000 ug/min | Freq: Once | INTRAVENOUS | Status: AC
  Administered 2015-11-05: 2 ug/min via INTRAVENOUS
  Filled 2015-11-05: qty 4

## 2015-11-05 MED ORDER — AMIODARONE HCL IN DEXTROSE 360-4.14 MG/200ML-% IV SOLN
300.0000 mg/h | Freq: Once | INTRAVENOUS | Status: AC
  Administered 2015-11-05: 300 mg/h via INTRAVENOUS

## 2015-11-05 MED ORDER — PIPERACILLIN-TAZOBACTAM 3.375 G IVPB 30 MIN
3.3750 g | Freq: Once | INTRAVENOUS | Status: AC
  Administered 2015-11-05: 3.375 g via INTRAVENOUS
  Filled 2015-11-05: qty 50

## 2015-11-05 MED ORDER — SODIUM CHLORIDE 0.9 % IV BOLUS (SEPSIS): 1000.0000 mL | INTRAVENOUS | Status: DC

## 2015-11-05 MED ORDER — SODIUM CHLORIDE 0.9 % IV SOLN
INTRAVENOUS | Status: AC | PRN
  Administered 2015-11-05: 1000 mL/h via INTRAVENOUS

## 2015-11-05 MED ORDER — EPINEPHRINE HCL 0.1 MG/ML IJ SOSY
PREFILLED_SYRINGE | INTRAMUSCULAR | Status: AC | PRN
  Administered 2015-11-05: 1 via INTRAVENOUS

## 2015-11-05 MED ORDER — ETOMIDATE 2 MG/ML IV SOLN
INTRAVENOUS | Status: AC | PRN
  Administered 2015-11-05: 20 mg via INTRAVENOUS

## 2015-11-05 MED ORDER — SODIUM BICARBONATE 8.4 % IV SOLN
INTRAVENOUS | Status: AC | PRN
  Administered 2015-11-05: 100 meq via INTRAVENOUS

## 2015-11-05 MED ORDER — FUROSEMIDE 10 MG/ML IJ SOLN
40.0000 mg | Freq: Once | INTRAMUSCULAR | Status: AC
  Administered 2015-11-05: 40 mg via INTRAVENOUS
  Filled 2015-11-05: qty 4

## 2015-11-05 MED ORDER — VANCOMYCIN HCL IN DEXTROSE 1-5 GM/200ML-% IV SOLN
1000.0000 mg | Freq: Once | INTRAVENOUS | Status: AC
  Administered 2015-11-06: 1000 mg via INTRAVENOUS
  Filled 2015-11-05: qty 200

## 2015-11-05 MED ORDER — ROCURONIUM BROMIDE 50 MG/5ML IV SOLN
INTRAVENOUS | Status: AC | PRN
  Administered 2015-11-05: 60 mg via INTRAVENOUS

## 2015-11-05 NOTE — ED Notes (Signed)
Peri care coompleted, clothing covering in stool, clothing was cut and discarded, one pack of matches and cigarettes in pockets.

## 2015-11-05 NOTE — ED Provider Notes (Signed)
Patient presented to the emergency room in acute respiratory distress. History is very limited. The patient's unable to speak. EMS was called to the patient's home for unresponsiveness. Patient was found foaming at the mouth with room air sats at 40%. He was started on BiPAP and transported to the emergency room Physical Exam  BP 132/96 mmHg  Pulse 110  Temp(Src) 96.5 F (35.8 C) (Rectal)  Resp 20  SpO2 86%  Physical Exam  Constitutional: He appears distressed.  Underweight  HENT:  Head: Normocephalic and atraumatic.  Right Ear: External ear normal.  Left Ear: External ear normal.  Pink frothy foam in the mouth  Eyes: Conjunctivae are normal. Right eye exhibits no discharge. Left eye exhibits no discharge. No scleral icterus.  Neck: Neck supple. No tracheal deviation present.  Distended neck veins  Cardiovascular: Normal rate.   Pulmonary/Chest: No stridor. He is in respiratory distress. He has rales.  Abdominal: Soft. He exhibits no distension. There is no tenderness.  Genitourinary:  Fecal incontinence  Musculoskeletal: He exhibits no edema.  Legs are covered in brown debris  Neurological: He is unresponsive. Cranial nerve deficit: no gross deficits  GCS eye subscore is 4. GCS verbal subscore is 1. GCS motor subscore is 1.  Skin: Skin is warm. No rash noted. He is not diaphoretic.  Psychiatric: He has a normal mood and affect.  Nursing note and vitals reviewed.   ED Course  Procedures CRITICAL CARE Performed by: [id number] Total critical care time: 40 minutes Critical care time was exclusive of separately billable procedures and treating other patients. Critical care was necessary to treat or prevent imminent or life-threatening deterioration. Critical care was time spent personally by me on the following activities: development of treatment plan with patient and/or surrogate as well as nursing, discussions with consultants, evaluation of patient's response to treatment,  examination of patient, obtaining history from patient or surrogate, ordering and performing treatments and interventions, ordering and review of laboratory studies, ordering and review of radiographic studies, pulse oximetry and re-evaluation of patient's condition.  MDM Patient presented in severe respiratory distress. He was intubated. Shortly after intubation the patient lost pulses. He was given a dose of epinephrine and bicarb and 1-2 minutes of CPR. Patient had return of spontaneous circulation. He had a wide-complex tachycardia following his return of circulation. She didn't was given a dose of amiodarone. His heart rate has decreased.  Patient's blood pressure started dropping and he was started on Levothroid. Most recent blood pressure is more stable at 132/96. Pulse is stable at 110. He remains hypoxic. We've increased his PEEP patient will be given a dose of Lasix.  Empiric abx started.  Plan on admission to the ICU.     Dorie Rank, MD 11/20/2015 [phone]

## 2015-11-05 NOTE — ED Provider Notes (Signed)
CSN: [id number]     Arrival date & time 11/10/2015  2214 History   First MD Initiated Contact with Patient 11/29/2015 2231     Chief Complaint  Patient presents with  . Respiratory Distress   The history is provided by the EMS personnel. No language interpreter was used.   Patient brought the ED via EMS for evaluation of extreme shortness of breath, unresponsiveness. EMS was called to patient's home where he was found unresponsive foaming at the mouth. Patient found to have oxygen saturation of 40% upon arrival. He was placed on CPAP brought to emergency department. Friends at the scene told EMS that patient had been feeling poorly for several weeks.  Level V exception due to patient's condition of extreme respiratory distress, unresponsiveness Past Medical History  Diagnosis Date  . Hypertension   . Heart murmur     pt told he has heart murmur-states it does not cause him any problems  . Cough     09/08/11  NEW ONSET OF COUGH--STARTED 09/04/11 - NO FEVER--PT THINKS HE MAY BE GETTING A COLD  . Cold     Head congestion, non productive cough, afebrile  . H/O acute pancreatitis   . Prostate cancer   . Cardiomyopathy   . Elevated PSA   . Anxiety   . Depression   . Undiagnosed cardiac murmurs     functional murmur  . History of radiation therapy 11/24/11 - 01/24/12    prostate, seminal vesicles   Past Surgical History  Procedure Laterality Date  . Hemorrhoid surgery      history of destruction of external hemorrhoids  . Hand nerve repair      history of right hand repair surgery  . Prostate biopsy  12/21/2010  . Cardiac catheterization  10/07/2011   Family History  Problem Relation Age of Onset  . Diabetes type II Sister   . Hypertension Sister    Social History  Substance Use Topics  . Smoking status: Current Every Day Smoker -- 1.00 packs/day for 40 years    Types: Cigarettes  . Smokeless tobacco: Never Used     Comment: 01/10/12 states < 1 ppd  . Alcohol Use: 7.0 oz/week    14  drink(s) per week     Comment: 1 weekend per month//hx of ETOH abuse    Review of Systems  Unable to perform ROS: Patient unresponsive    Allergies  Review of patient's allergies indicates no known allergies.  Home Medications   Prior to Admission medications   Medication Sig Start Date End Date Taking? Authorizing Provider  amLODipine (NORVASC) 10 MG tablet Take 1 tablet (10 mg total) by mouth daily. 10/20/11 10/19/12  Larey Dresser, MD  aspirin 81 MG tablet Take 81 mg by mouth daily.    Historical Provider, MD  atorvastatin (LIPITOR) 20 MG tablet Take 1 tablet (20 mg total) by mouth daily. 10/20/11 10/19/12  Larey Dresser, MD  buPROPion (WELLBUTRIN SR) 150 MG 12 hr tablet Take 1 tablet (150 mg total) by mouth 2 (two) times daily. 10/20/11 10/19/12  Larey Dresser, MD  carvedilol (COREG) 12.5 MG tablet Take 1 tablet (12.5 mg total) by mouth 2 (two) times daily. 11/18/11 11/17/12  Larey Dresser, MD  cloNIDine (CATAPRES) 0.1 MG tablet Take 1 tablet (0.1 mg total) by mouth 2 (two) times daily. 10/20/11 10/19/12  Larey Dresser, MD  isosorbide mononitrate (IMDUR) 60 MG 24 hr tablet Take 1 tablet (60 mg total) by mouth every morning.  10/20/11 10/19/12  Larey Dresser, MD  phenazopyridine (PYRIDIUM) 200 MG tablet Take 200 mg by mouth 3 (three) times daily as needed. Pyridium 200 mg tablet take one by mouth tid prn painful urination dispense 60 one refill called to [facility] at [place]. 12/22/2011 at 1135 12/22/11   Arloa Koh, MD  sildenafil (VIAGRA) 25 MG tablet Take 25 mg by mouth daily as needed for erectile dysfunction.    Historical Provider, MD  solifenacin (VESICARE) 5 MG tablet Take 5 mg by mouth daily.    Historical Provider, MD  Tamsulosin HCl (FLOMAX) 0.4 MG CAPS Take 0.4 mg by mouth daily. Flomax 0.4mg  one tablet by mouth per day; qty 30; 3 refills; called into [facility] at [place]. 12/22/11 at 1134 12/22/11   Arloa Koh, MD   telmisartan-hydrochlorothiazide (MICARDIS HCT) 80-25 MG per tablet Take 1 tablet by mouth daily. 10/20/11 10/19/12  Larey Dresser, MD   BP 136/95 mmHg  Pulse 87  Temp(Src) 95 F (35 C) (Rectal)  Resp 20  SpO2 92% Physical Exam  Constitutional: He appears distressed.  HENT:  Head: Normocephalic and atraumatic.  Eyes: Pupils are equal, round, and reactive to light.  Neck: JVD present.  Cardiovascular: Regular rhythm.  Tachycardia present.   Pulmonary/Chest: No stridor. He is in respiratory distress. He has rales.  Abdominal: Soft. He exhibits no distension.  Musculoskeletal: He exhibits no edema.  Lymphadenopathy:    He has no cervical adenopathy.  Skin: He is diaphoretic. No erythema.    ED Course  .Intubation Date/Time: 11/21/2015 12:11 AM Performed by: Vira Blanco Authorized by: Dorie Rank Consent: The procedure was performed in an emergent situation. Patient identity confirmed: arm band Indications: respiratory distress Intubation method: video-assisted Patient status: paralyzed (RSI) Preoxygenation: Cpap. Sedatives: etomidate Laryngoscope size: Miller 4 Tube size: 7.5 mm Tube type: cuffed Number of attempts: 1 Cords visualized: yes Post-procedure assessment: chest rise,  ETCO2 monitor and CO2 detector Breath sounds: equal Cuff inflated: yes ETT to lip: 25 cm Tube secured with: ETT holder Chest x-ray interpreted by me, other physician and radiologist. Chest x-ray findings: endotracheal tube in appropriate position Patient tolerance: Patient tolerated the procedure well with no immediate complications   (including critical care time) Labs Review Labs Reviewed  CBC WITH DIFFERENTIAL/PLATELET - Abnormal; Notable for the following:    WBC 20.9 (*)    Hemoglobin 12.9 (*)    HCT 37.7 (*)    Neutro Abs 16.5 (*)    All other components within normal limits  COMPREHENSIVE METABOLIC PANEL - Abnormal; Notable for the following:    CO2 19 (*)    Glucose, Bld 200  (*)    Creatinine, Ser 1.90 (*)    Calcium 8.3 (*)    Total Protein 5.4 (*)    Albumin 2.8 (*)    AST 166 (*)    GFR calc non Af Amer 35 (*)    GFR calc Af Amer 40 (*)    All other components within normal limits  PROTIME-INR - Abnormal; Notable for the following:    Prothrombin Time 15.6 (*)    All other components within normal limits  BRAIN NATRIURETIC PEPTIDE - Abnormal; Notable for the following:    B Natriuretic Peptide 1568.2 (*)    All other components within normal limits  I-STAT CHEM 8, ED - Abnormal; Notable for the following:    Creatinine, Ser 1.60 (*)    Glucose, Bld 201 (*)    Calcium,  Ion 1.01 (*)    All other components within normal limits  I-STAT ARTERIAL BLOOD GAS, ED - Abnormal; Notable for the following:    pH, Arterial 7.060 (*)    pCO2 arterial 61.5 (*)    pO2, Arterial 62.0 (*)    Bicarbonate 17.4 (*)    Acid-base deficit 13.0 (*)    All other components within normal limits  I-STAT CHEM 8, ED - Abnormal; Notable for the following:    Potassium 5.2 (*)    Creatinine, Ser 1.70 (*)    Glucose, Bld 199 (*)    Calcium, Ion 1.04 (*)    All other components within normal limits  I-STAT TROPOININ, ED - Abnormal; Notable for the following:    Troponin i, poc 0.43 (*)    All other components within normal limits  I-STAT CG4 LACTIC ACID, ED - Abnormal; Notable for the following:    Lactic Acid, Venous 6.46 (*)    All other components within normal limits  CULTURE, BLOOD (ROUTINE X 2)  CULTURE, BLOOD (ROUTINE X 2)  URINE CULTURE  CULTURE, RESPIRATORY (NON-EXPECTORATED)  URINALYSIS, ROUTINE W REFLEX MICROSCOPIC (NOT AT [facility])  CBC  BASIC METABOLIC PANEL  BLOOD GAS, ARTERIAL  MAGNESIUM  PHOSPHORUS  TROPONIN I  TROPONIN I  TROPONIN I  LACTIC ACID, PLASMA  LACTIC ACID, PLASMA  PROCALCITONIN  TSH  URINE RAPID DRUG SCREEN, HOSP PERFORMED  BLOOD GAS, ARTERIAL  HEMOGLOBIN A1C    Imaging Review Dg Chest Portable 1 View  11/12/2015  CLINICAL DATA:   Intubation. Respiratory distress. History of hypertension, prostate cancer. EXAM: PORTABLE CHEST 1 VIEW COMPARISON:  Chest radiograph September 08, 2011 FINDINGS: Endotracheal tube tip projects 2.3 cm above the carina. Nasogastric tube past the proximal stomach, distal tip not imaged. Multiple pacer pads and wires overlie the chest. The cardiac silhouette appears moderately enlarged, mediastinal silhouette is nonsuspicious. Diffuse interstitial prominence, RIGHT greater than LEFT with pulmonary vascular congestion. No pleural effusions though LEFT costophrenic angle is not imaged. No pneumothorax. Small metallic linear density projecting at RIGHT chest wall may be the external to the patient or, represent needle fragment. Recommend direct inspection. IMPRESSION: Endotracheal tube tip projects 2.3 cm above the carina. Nasogastric tube past proximal stomach. Increasing cardiomegaly and findings of asymmetric interstitial pulmonary edema. Electronically Signed   By: Elon Alas M.D.   On: 11/21/2015 22:59   Dg Abd Portable 1v  11/27/2015  CLINICAL DATA:  70 year old male status post intubation and enteric tube placement. Respiratory distress. EXAM: PORTABLE ABDOMEN - 1 VIEW COMPARISON:  None. FINDINGS: An endotracheal tube is partially visualized extending into the left hemi abdomen with tip positioned in the left lower abdomen overlying the iliac crest. Normal caliber air-filled loops of small bowel noted. There is cardiomegaly. There is diffuse hazy density of the right lung base. A 2 cm linear radiopaque density over the right lung base may be superimposed on the patient. Clinical correlation is recommended. IMPRESSION: Enteric tube the tip in the left lower abdomen superimposed over the iliac crest. Electronically Signed   By: Anner Crete M.D.   On: 11/23/2015 23:02   I have personally reviewed and evaluated these images and lab results as part of my medical decision-making.   EKG  Interpretation   Date/Time:  Wednesday November 05 2015 22:29:09 EST Ventricular Rate:  188 PR Interval:  114 QRS Duration: 144 QT Interval:  292 QTC Calculation: 516 R Axis:   39 Text Interpretation:  Extreme tachycardia with wide complex, no  further  rhythm analysis attempted tachcyardia new since prior tracing Confirmed by  KNAPP  MD-J, JON [phone]) on 11/27/2015 10:33:11 PM      MDM   Final diagnoses:  Acute pulmonary edema (HCC)  Acute respiratory failure with hypoxia [facility])  Cardiac arrest [facility])    Patient is a 70 year old man with history of prostate cancer, CAD, CK D who presents via EMS in severe respiratory distress. He was found to be unresponsive, sats 40% for EMS at home. Place on CPAP with improvement sats in the low 80s.  On arrival patient was essentially unresponsive. He was in severe respiratory distress was immediately intubated for airway protection and respiratory distress. He is attentive etomidate and rocuronium. Intubation was successful. Shortly after intubation patient had slowing of his heart rate of 40s or 50s and was found without a pulse. Initiated CPR, gave epinephrine and bicarbonate. CPR for roughly 2 minutes. Pulse regained and sustained with no further CPR. Following CPR patient with heart rate in the 180s. EKG difficult to interpret but concern for Y, tachycardia. Patient still with pulse. No shocks delivered. Amiodarone 300 given with improvement in his heart rate  Patient's temperature 96.5. He was soiled in dirt and feces. Chest x-ray with increasing cardiomegaly and asymmetric pulmonary edema. Clinical picture consistent with flash pulmonary edema. Lactate markedly elevated, white blood cell count elevated. With patient's low temperature, elevate white blood cell count, tachycardia will cover with vancomycin and Zosyn for unspecified infectious source. Initially given 1 L fluid bolus. To do markedly cardiomegaly and flash pulmonary edema we stopped fluids  and did not give full 30 mL/kg. Instead we diuresed with 40 mg of IV Lasix.  Patient admitted to intensive care unit for evaluation of severe respiratory distress, flash pulmonary edema, elevated lactate This likely picture of cardiogenic shock with flash pulmonary edema with potential infectious etiology.   Discussed with Dr. Tomi Bamberger.      Vira Blanco, MD 11/11/2015 [phone]

## 2015-11-05 NOTE — Progress Notes (Signed)
[place] Progress Note Patient Name: CHORD MCCURTY DOB: 07-Dec-1945 MRN: [id number]   Date of Service  11/12/2015  HPI/Events of Note  Pt found down at home, O2 sat 40% with unknown down time. Briefly lost pulse in ER about 1 min with intubation. CXR with R infiltrates, possible aspiration/ARDS.Marland Kitchen   eICU Interventions  Continue supportive care, no hypothermia at this time.         Laverle Hobby 12/05/2015, 11:33 PM

## 2015-11-05 NOTE — ED Notes (Signed)
Pt presents from HOME with [facility] who was called for unresponsiveness and foaming at the mouth; EMS reports room air sats of 40% on arrival; pt was alert upon EMS arrival; family and patient are poor historians per EMS; EMS applied CPAP and brought sats to 81%

## 2015-11-06 ENCOUNTER — Inpatient Hospital Stay (HOSPITAL_COMMUNITY): Payer: Medicare Other

## 2015-11-06 ENCOUNTER — Ambulatory Visit (HOSPITAL_COMMUNITY): Payer: Medicare Other

## 2015-11-06 ENCOUNTER — Encounter (HOSPITAL_COMMUNITY): Admission: EM | Disposition: E | Payer: Self-pay | Source: Home / Self Care | Attending: Pulmonary Disease

## 2015-11-06 ENCOUNTER — Encounter (HOSPITAL_COMMUNITY): Payer: Self-pay | Admitting: Interventional Cardiology

## 2015-11-06 DIAGNOSIS — Z923 Personal history of irradiation: Secondary | ICD-10-CM | POA: Diagnosis not present

## 2015-11-06 DIAGNOSIS — Z515 Encounter for palliative care: Secondary | ICD-10-CM | POA: Diagnosis not present

## 2015-11-06 DIAGNOSIS — Z452 Encounter for adjustment and management of vascular access device: Secondary | ICD-10-CM | POA: Diagnosis not present

## 2015-11-06 DIAGNOSIS — J9601 Acute respiratory failure with hypoxia: Secondary | ICD-10-CM | POA: Diagnosis not present

## 2015-11-06 DIAGNOSIS — R570 Cardiogenic shock: Secondary | ICD-10-CM | POA: Insufficient documentation

## 2015-11-06 DIAGNOSIS — N183 Chronic kidney disease, stage 3 (moderate): Secondary | ICD-10-CM | POA: Diagnosis not present

## 2015-11-06 DIAGNOSIS — E873 Alkalosis: Secondary | ICD-10-CM | POA: Diagnosis not present

## 2015-11-06 DIAGNOSIS — G931 Anoxic brain damage, not elsewhere classified: Secondary | ICD-10-CM | POA: Diagnosis present

## 2015-11-06 DIAGNOSIS — N170 Acute kidney failure with tubular necrosis: Secondary | ICD-10-CM | POA: Diagnosis not present

## 2015-11-06 DIAGNOSIS — Z79899 Other long term (current) drug therapy: Secondary | ICD-10-CM | POA: Diagnosis not present

## 2015-11-06 DIAGNOSIS — I469 Cardiac arrest, cause unspecified: Secondary | ICD-10-CM

## 2015-11-06 DIAGNOSIS — I639 Cerebral infarction, unspecified: Secondary | ICD-10-CM | POA: Diagnosis not present

## 2015-11-06 DIAGNOSIS — I214 Non-ST elevation (NSTEMI) myocardial infarction: Secondary | ICD-10-CM | POA: Diagnosis not present

## 2015-11-06 DIAGNOSIS — R402142 Coma scale, eyes open, spontaneous, at arrival to emergency department: Secondary | ICD-10-CM | POA: Diagnosis present

## 2015-11-06 DIAGNOSIS — R739 Hyperglycemia, unspecified: Secondary | ICD-10-CM | POA: Diagnosis present

## 2015-11-06 DIAGNOSIS — R918 Other nonspecific abnormal finding of lung field: Secondary | ICD-10-CM | POA: Diagnosis not present

## 2015-11-06 DIAGNOSIS — Z7982 Long term (current) use of aspirin: Secondary | ICD-10-CM | POA: Diagnosis not present

## 2015-11-06 DIAGNOSIS — I5021 Acute systolic (congestive) heart failure: Secondary | ICD-10-CM | POA: Insufficient documentation

## 2015-11-06 DIAGNOSIS — R4182 Altered mental status, unspecified: Secondary | ICD-10-CM | POA: Diagnosis not present

## 2015-11-06 DIAGNOSIS — J90 Pleural effusion, not elsewhere classified: Secondary | ICD-10-CM | POA: Diagnosis not present

## 2015-11-06 DIAGNOSIS — I255 Ischemic cardiomyopathy: Secondary | ICD-10-CM | POA: Diagnosis present

## 2015-11-06 DIAGNOSIS — N179 Acute kidney failure, unspecified: Secondary | ICD-10-CM | POA: Diagnosis not present

## 2015-11-06 DIAGNOSIS — L8990 Pressure ulcer of unspecified site, unspecified stage: Secondary | ICD-10-CM | POA: Insufficient documentation

## 2015-11-06 DIAGNOSIS — I2510 Atherosclerotic heart disease of native coronary artery without angina pectoris: Secondary | ICD-10-CM | POA: Diagnosis present

## 2015-11-06 DIAGNOSIS — R34 Anuria and oliguria: Secondary | ICD-10-CM | POA: Diagnosis present

## 2015-11-06 DIAGNOSIS — F419 Anxiety disorder, unspecified: Secondary | ICD-10-CM | POA: Diagnosis present

## 2015-11-06 DIAGNOSIS — Z66 Do not resuscitate: Secondary | ICD-10-CM | POA: Diagnosis not present

## 2015-11-06 DIAGNOSIS — R6521 Severe sepsis with septic shock: Secondary | ICD-10-CM | POA: Diagnosis present

## 2015-11-06 DIAGNOSIS — J13 Pneumonia due to Streptococcus pneumoniae: Secondary | ICD-10-CM | POA: Diagnosis present

## 2015-11-06 DIAGNOSIS — E785 Hyperlipidemia, unspecified: Secondary | ICD-10-CM | POA: Diagnosis present

## 2015-11-06 DIAGNOSIS — I6340 Cerebral infarction due to embolism of unspecified cerebral artery: Secondary | ICD-10-CM | POA: Diagnosis present

## 2015-11-06 DIAGNOSIS — I447 Left bundle-branch block, unspecified: Secondary | ICD-10-CM | POA: Diagnosis present

## 2015-11-06 DIAGNOSIS — J9690 Respiratory failure, unspecified, unspecified whether with hypoxia or hypercapnia: Secondary | ICD-10-CM | POA: Diagnosis not present

## 2015-11-06 DIAGNOSIS — J811 Chronic pulmonary edema: Secondary | ICD-10-CM | POA: Diagnosis not present

## 2015-11-06 DIAGNOSIS — J9600 Acute respiratory failure, unspecified whether with hypoxia or hypercapnia: Secondary | ICD-10-CM | POA: Diagnosis not present

## 2015-11-06 DIAGNOSIS — Z8249 Family history of ischemic heart disease and other diseases of the circulatory system: Secondary | ICD-10-CM | POA: Diagnosis not present

## 2015-11-06 DIAGNOSIS — J189 Pneumonia, unspecified organism: Secondary | ICD-10-CM | POA: Diagnosis not present

## 2015-11-06 DIAGNOSIS — R042 Hemoptysis: Secondary | ICD-10-CM | POA: Diagnosis not present

## 2015-11-06 DIAGNOSIS — Z8546 Personal history of malignant neoplasm of prostate: Secondary | ICD-10-CM | POA: Diagnosis not present

## 2015-11-06 DIAGNOSIS — Z681 Body mass index (BMI) 19 or less, adult: Secondary | ICD-10-CM | POA: Diagnosis not present

## 2015-11-06 DIAGNOSIS — D696 Thrombocytopenia, unspecified: Secondary | ICD-10-CM | POA: Diagnosis not present

## 2015-11-06 DIAGNOSIS — E440 Moderate protein-calorie malnutrition: Secondary | ICD-10-CM | POA: Diagnosis present

## 2015-11-06 DIAGNOSIS — J9691 Respiratory failure, unspecified with hypoxia: Secondary | ICD-10-CM | POA: Diagnosis present

## 2015-11-06 DIAGNOSIS — R1110 Vomiting, unspecified: Secondary | ICD-10-CM | POA: Diagnosis not present

## 2015-11-06 DIAGNOSIS — Z95828 Presence of other vascular implants and grafts: Secondary | ICD-10-CM | POA: Diagnosis not present

## 2015-11-06 DIAGNOSIS — J810 Acute pulmonary edema: Secondary | ICD-10-CM | POA: Insufficient documentation

## 2015-11-06 DIAGNOSIS — F329 Major depressive disorder, single episode, unspecified: Secondary | ICD-10-CM | POA: Diagnosis present

## 2015-11-06 DIAGNOSIS — E872 Acidosis: Secondary | ICD-10-CM | POA: Diagnosis present

## 2015-11-06 DIAGNOSIS — F1010 Alcohol abuse, uncomplicated: Secondary | ICD-10-CM | POA: Diagnosis present

## 2015-11-06 DIAGNOSIS — F17210 Nicotine dependence, cigarettes, uncomplicated: Secondary | ICD-10-CM | POA: Diagnosis present

## 2015-11-06 DIAGNOSIS — Z833 Family history of diabetes mellitus: Secondary | ICD-10-CM | POA: Diagnosis not present

## 2015-11-06 DIAGNOSIS — K7200 Acute and subacute hepatic failure without coma: Secondary | ICD-10-CM | POA: Diagnosis present

## 2015-11-06 DIAGNOSIS — I130 Hypertensive heart and chronic kidney disease with heart failure and stage 1 through stage 4 chronic kidney disease, or unspecified chronic kidney disease: Secondary | ICD-10-CM | POA: Diagnosis present

## 2015-11-06 DIAGNOSIS — R403 Persistent vegetative state: Secondary | ICD-10-CM | POA: Diagnosis present

## 2015-11-06 DIAGNOSIS — I5023 Acute on chronic systolic (congestive) heart failure: Secondary | ICD-10-CM | POA: Diagnosis present

## 2015-11-06 DIAGNOSIS — Z4682 Encounter for fitting and adjustment of non-vascular catheter: Secondary | ICD-10-CM | POA: Diagnosis not present

## 2015-11-06 DIAGNOSIS — R402312 Coma scale, best motor response, none, at arrival to emergency department: Secondary | ICD-10-CM | POA: Diagnosis present

## 2015-11-06 DIAGNOSIS — D6489 Other specified anemias: Secondary | ICD-10-CM | POA: Diagnosis not present

## 2015-11-06 DIAGNOSIS — R0602 Shortness of breath: Secondary | ICD-10-CM | POA: Diagnosis not present

## 2015-11-06 DIAGNOSIS — A419 Sepsis, unspecified organism: Secondary | ICD-10-CM | POA: Diagnosis not present

## 2015-11-06 DIAGNOSIS — G9340 Encephalopathy, unspecified: Secondary | ICD-10-CM

## 2015-11-06 DIAGNOSIS — I429 Cardiomyopathy, unspecified: Secondary | ICD-10-CM | POA: Diagnosis not present

## 2015-11-06 DIAGNOSIS — R402212 Coma scale, best verbal response, none, at arrival to emergency department: Secondary | ICD-10-CM | POA: Diagnosis present

## 2015-11-06 DIAGNOSIS — I10 Essential (primary) hypertension: Secondary | ICD-10-CM | POA: Diagnosis not present

## 2015-11-06 HISTORY — PX: CARDIAC CATHETERIZATION: SHX172

## 2015-11-06 LAB — GLUCOSE, CAPILLARY
GLUCOSE-CAPILLARY: 125 mg/dL — AB (ref 65–99)
GLUCOSE-CAPILLARY: 153 mg/dL — AB (ref 65–99)
GLUCOSE-CAPILLARY: 157 mg/dL — AB (ref 65–99)
GLUCOSE-CAPILLARY: 85 mg/dL (ref 65–99)
Glucose-Capillary: 65 mg/dL (ref 65–99)
Glucose-Capillary: 151 mg/dL — ABNORMAL HIGH (ref 65–99)
Glucose-Capillary: 105 mg/dL — ABNORMAL HIGH (ref 65–99)
Glucose-Capillary: 101 mg/dL — ABNORMAL HIGH (ref 65–99)

## 2015-11-06 LAB — POCT I-STAT 3, ART BLOOD GAS (G3+)
ACID-BASE DEFICIT: 11 mmol/L — AB (ref 0.0–2.0)
ACID-BASE DEFICIT: 9 mmol/L — AB (ref 0.0–2.0)
Acid-Base Excess: 4 mmol/L — ABNORMAL HIGH (ref 0.0–2.0)
Acid-base deficit: 1 mmol/L (ref 0.0–2.0)
BICARBONATE: 17.5 meq/L — AB (ref 20.0–24.0)
BICARBONATE: 21.3 meq/L (ref 20.0–24.0)
Bicarbonate: 22 mEq/L (ref 20.0–24.0)
Bicarbonate: 27.6 mEq/L — ABNORMAL HIGH (ref 20.0–24.0)
O2 SAT: 95 %
O2 SAT: 98 %
O2 SAT: 97 %
O2 Saturation: 82 %
PCO2 ART: 35.9 mmHg (ref 35.0–45.0)
PO2 ART: 94 mmHg (ref 80.0–100.0)
Patient temperature: 98.7
TCO2: 19 mmol/L (ref 0–100)
TCO2: 23 mmol/L (ref 0–100)
TCO2: 23 mmol/L (ref 0–100)
TCO2: 29 mmol/L (ref 0–100)
pCO2 arterial: 46.2 mmHg — ABNORMAL HIGH (ref 35.0–45.0)
pCO2 arterial: 66.5 mmHg (ref 35.0–45.0)
pCO2 arterial: 30.7 mmHg — ABNORMAL LOW (ref 35.0–45.0)
pH, Arterial: 7.188 — CL (ref 7.350–7.450)
pH, Arterial: 7.115 — CL (ref 7.350–7.450)
pH, Arterial: 7.461 — ABNORMAL HIGH (ref 7.350–7.450)
pH, Arterial: 7.495 — ABNORMAL HIGH (ref 7.350–7.450)
pO2, Arterial: 63 mmHg — ABNORMAL LOW (ref 80.0–100.0)
pO2, Arterial: 96 mmHg (ref 80.0–100.0)
pO2, Arterial: 80 mmHg (ref 80.0–100.0)

## 2015-11-06 LAB — CBC
HCT: 35.1 % — ABNORMAL LOW (ref 39.0–52.0)
HEMATOCRIT: 41.1 % (ref 39.0–52.0)
HEMOGLOBIN: 13.5 g/dL (ref 13.0–17.0)
HEMOGLOBIN: 12.1 g/dL — AB (ref 13.0–17.0)
MCH: 27.6 pg (ref 26.0–34.0)
MCH: 28.9 pg (ref 26.0–34.0)
MCHC: 32.8 g/dL (ref 30.0–36.0)
MCHC: 34.5 g/dL (ref 30.0–36.0)
MCV: 84 fL (ref 78.0–100.0)
MCV: 84 fL (ref 78.0–100.0)
PLATELETS: 134 10*3/uL — AB (ref 150–400)
Platelets: 177 10*3/uL (ref 150–400)
RBC: 4.89 MIL/uL (ref 4.22–5.81)
RBC: 4.18 MIL/uL — AB (ref 4.22–5.81)
RDW: 14 % (ref 11.5–15.5)
RDW: 14.3 % (ref 11.5–15.5)
WBC: 10.6 10*3/uL — AB (ref 4.0–10.5)
WBC: 7.5 10*3/uL (ref 4.0–10.5)

## 2015-11-06 LAB — RENAL FUNCTION PANEL
ALBUMIN: 2 g/dL — AB (ref 3.5–5.0)
ANION GAP: 11 (ref 5–15)
Albumin: 2 g/dL — ABNORMAL LOW (ref 3.5–5.0)
Anion gap: 13 (ref 5–15)
BUN: 21 mg/dL — ABNORMAL HIGH (ref 6–20)
BUN: 23 mg/dL — AB (ref 6–20)
CALCIUM: 8 mg/dL — AB (ref 8.9–10.3)
CHLORIDE: 105 mmol/L (ref 101–111)
CHLORIDE: 102 mmol/L (ref 101–111)
CO2: 28 mmol/L (ref 22–32)
CO2: 27 mmol/L (ref 22–32)
CREATININE: 2.41 mg/dL — AB (ref 0.61–1.24)
CREATININE: 2.61 mg/dL — AB (ref 0.61–1.24)
Calcium: 8.2 mg/dL — ABNORMAL LOW (ref 8.9–10.3)
GFR calc Af Amer: 28 mL/min — ABNORMAL LOW (ref >60)
GFR, EST AFRICAN AMERICAN: 30 mL/min — AB (ref >60)
GFR, EST NON AFRICAN AMERICAN: 26 mL/min — AB (ref >60)
GFR, EST NON AFRICAN AMERICAN: 24 mL/min — AB (ref >60)
Glucose, Bld: 130 mg/dL — ABNORMAL HIGH (ref 65–99)
Glucose, Bld: 109 mg/dL — ABNORMAL HIGH (ref 65–99)
POTASSIUM: 3.4 mmol/L — AB (ref 3.5–5.1)
Phosphorus: 3 mg/dL (ref 2.5–4.6)
Phosphorus: 2.7 mg/dL (ref 2.5–4.6)
Potassium: 3.5 mmol/L (ref 3.5–5.1)
SODIUM: 142 mmol/L (ref 135–145)
Sodium: 144 mmol/L (ref 135–145)

## 2015-11-06 LAB — URINE MICROSCOPIC-ADD ON: WBC UA: NONE SEEN WBC/hpf (ref 0–5)

## 2015-11-06 LAB — TROPONIN I
TROPONIN I: 3.64 ng/mL — AB (ref <0.031)
TROPONIN I: 10.6 ng/mL — AB (ref <0.031)
TROPONIN I: 31.57 ng/mL — AB (ref <0.031)
Troponin I: >65 ng/mL (ref <0.031)
Troponin I: >65 ng/mL (ref <0.031)

## 2015-11-06 LAB — HEPARIN LEVEL (UNFRACTIONATED): Heparin Unfractionated: 0.25 IU/mL — ABNORMAL LOW (ref 0.30–0.70)

## 2015-11-06 LAB — I-STAT ARTERIAL BLOOD GAS, ED
Acid-base deficit: 11 mmol/L — ABNORMAL HIGH (ref 0.0–2.0)
BICARBONATE: 16.8 meq/L — AB (ref 20.0–24.0)
O2 Saturation: 90 %
PCO2 ART: 38.5 mmHg (ref 35.0–45.0)
PO2 ART: 61 mmHg — AB (ref 80.0–100.0)
TCO2: 18 mmol/L (ref 0–100)
pH, Arterial: 7.238 — ABNORMAL LOW (ref 7.350–7.450)

## 2015-11-06 LAB — LACTIC ACID, PLASMA
Lactic Acid, Venous: 6.5 mmol/L (ref 0.5–2.0)
Lactic Acid, Venous: 7.5 mmol/L (ref 0.5–2.0)

## 2015-11-06 LAB — URINALYSIS, ROUTINE W REFLEX MICROSCOPIC
BILIRUBIN URINE: NEGATIVE
GLUCOSE, UA: NEGATIVE mg/dL
KETONES UR: NEGATIVE mg/dL
Leukocytes, UA: NEGATIVE
Nitrite: NEGATIVE
PH: 5 (ref 5.0–8.0)
PROTEIN: 30 mg/dL — AB
Specific Gravity, Urine: 1.01 (ref 1.005–1.030)

## 2015-11-06 LAB — INFLUENZA PANEL BY PCR (TYPE A & B)
H1N1 flu by pcr: NOT DETECTED
INFLBPCR: NEGATIVE
Influenza A By PCR: NEGATIVE

## 2015-11-06 LAB — BASIC METABOLIC PANEL
ANION GAP: 17 — AB (ref 5–15)
ANION GAP: 14 (ref 5–15)
BUN: 14 mg/dL (ref 6–20)
BUN: 17 mg/dL (ref 6–20)
CALCIUM: 10.1 mg/dL (ref 8.9–10.3)
CHLORIDE: 109 mmol/L (ref 101–111)
CO2: 17 mmol/L — AB (ref 22–32)
CO2: 19 mmol/L — ABNORMAL LOW (ref 22–32)
Calcium: 8.5 mg/dL — ABNORMAL LOW (ref 8.9–10.3)
Chloride: 111 mmol/L (ref 101–111)
Creatinine, Ser: 1.69 mg/dL — ABNORMAL HIGH (ref 0.61–1.24)
Creatinine, Ser: 2.11 mg/dL — ABNORMAL HIGH (ref 0.61–1.24)
GFR calc Af Amer: 36 mL/min — ABNORMAL LOW (ref >60)
GFR calc non Af Amer: 40 mL/min — ABNORMAL LOW (ref >60)
GFR, EST AFRICAN AMERICAN: 47 mL/min — AB (ref >60)
GFR, EST NON AFRICAN AMERICAN: 31 mL/min — AB (ref >60)
GLUCOSE: 50 mg/dL — AB (ref 65–99)
Glucose, Bld: 140 mg/dL — ABNORMAL HIGH (ref 65–99)
Potassium: 3.9 mmol/L (ref 3.5–5.1)
Potassium: 4.3 mmol/L (ref 3.5–5.1)
SODIUM: 143 mmol/L (ref 135–145)
Sodium: 144 mmol/L (ref 135–145)

## 2015-11-06 LAB — PROCALCITONIN: Procalcitonin: 1.66 ng/mL

## 2015-11-06 LAB — RAPID URINE DRUG SCREEN, HOSP PERFORMED
Amphetamines: NOT DETECTED
BARBITURATES: NOT DETECTED
BENZODIAZEPINES: NOT DETECTED
COCAINE: NOT DETECTED
Opiates: NOT DETECTED
TETRAHYDROCANNABINOL: NOT DETECTED

## 2015-11-06 LAB — PHOSPHORUS: PHOSPHORUS: 5.3 mg/dL — AB (ref 2.5–4.6)

## 2015-11-06 LAB — CARBOXYHEMOGLOBIN
CARBOXYHEMOGLOBIN: 1 % (ref 0.5–1.5)
Carboxyhemoglobin: 0.4 % — ABNORMAL LOW (ref 0.5–1.5)
METHEMOGLOBIN: 0.8 % (ref 0.0–1.5)
Methemoglobin: 0.6 % (ref 0.0–1.5)
O2 Saturation: 43.8 %
O2 Saturation: 62.5 %
TOTAL HEMOGLOBIN: 10.3 g/dL — AB (ref 13.5–18.0)
Total hemoglobin: 10.7 g/dL — ABNORMAL LOW (ref 13.5–18.0)

## 2015-11-06 LAB — BRAIN NATRIURETIC PEPTIDE: B Natriuretic Peptide: 1568.2 pg/mL — ABNORMAL HIGH (ref 0.0–100.0)

## 2015-11-06 LAB — MRSA PCR SCREENING: MRSA by PCR: NEGATIVE

## 2015-11-06 LAB — MAGNESIUM: MAGNESIUM: 2 mg/dL (ref 1.7–2.4)

## 2015-11-06 LAB — CREATININE, URINE, RANDOM: CREATININE, URINE: 11.43 mg/dL

## 2015-11-06 LAB — SODIUM, URINE, RANDOM: SODIUM UR: 141 mmol/L

## 2015-11-06 LAB — STREP PNEUMONIAE URINARY ANTIGEN: STREP PNEUMO URINARY ANTIGEN: POSITIVE — AB

## 2015-11-06 LAB — TSH: TSH: 6.243 u[IU]/mL — ABNORMAL HIGH (ref 0.350–4.500)

## 2015-11-06 SURGERY — IABP INSERTION

## 2015-11-06 MED ORDER — DEXTROSE 5 % IV SOLN
500.0000 mg | INTRAVENOUS | Status: DC
  Administered 2015-11-06: 500 mg via INTRAVENOUS
  Filled 2015-11-06: qty 500

## 2015-11-06 MED ORDER — SODIUM CHLORIDE 0.9 % IV BOLUS (SEPSIS)
1000.0000 mL | Freq: Once | INTRAVENOUS | Status: AC
  Administered 2015-11-06: 1000 mL via INTRAVENOUS

## 2015-11-06 MED ORDER — SODIUM CHLORIDE 0.9 % IV SOLN: INTRAVENOUS | Status: DC | PRN

## 2015-11-06 MED ORDER — PIPERACILLIN-TAZOBACTAM 3.375 G IVPB
3.3750 g | Freq: Three times a day (TID) | INTRAVENOUS | Status: DC
  Filled 2015-11-06 (×2): qty 50

## 2015-11-06 MED ORDER — SODIUM CHLORIDE 0.9 % IV BOLUS (SEPSIS): 1000.0000 mL | Freq: Once | INTRAVENOUS | Status: DC

## 2015-11-06 MED ORDER — NOREPINEPHRINE BITARTRATE 1 MG/ML IV SOLN
2.0000 ug/min | INTRAVENOUS | Status: DC
  Filled 2015-11-06: qty 4

## 2015-11-06 MED ORDER — SODIUM BICARBONATE 8.4 % IV SOLN
INTRAVENOUS | Status: AC
  Filled 2015-11-06: qty 100

## 2015-11-06 MED ORDER — SODIUM BICARBONATE 8.4 % IV SOLN
50.0000 meq | Freq: Once | INTRAVENOUS | Status: DC
  Filled 2015-11-06: qty 50

## 2015-11-06 MED ORDER — ANTISEPTIC ORAL RINSE SOLUTION (CORINZ)
7.0000 mL | OROMUCOSAL | Status: DC
  Administered 2015-11-06 – 2015-11-19 (×125): 7 mL via OROMUCOSAL

## 2015-11-06 MED ORDER — SODIUM CHLORIDE 0.9% FLUSH: 10.0000 mL | INTRAVENOUS | Status: DC | PRN

## 2015-11-06 MED ORDER — HEPARIN (PORCINE) IN NACL 100-0.45 UNIT/ML-% IJ SOLN
550.0000 [IU]/h | INTRAMUSCULAR | Status: DC
  Administered 2015-11-06: 550 [IU]/h via INTRAVENOUS
  Filled 2015-11-06: qty 250

## 2015-11-06 MED ORDER — VASOPRESSIN 20 UNIT/ML IV SOLN
0.0300 [IU]/min | INTRAVENOUS | Status: DC
  Administered 2015-11-06 – 2015-11-07 (×2): 0.03 [IU]/min via INTRAVENOUS
  Filled 2015-11-06 (×2): qty 2

## 2015-11-06 MED ORDER — HEPARIN (PORCINE) IN NACL 100-0.45 UNIT/ML-% IJ SOLN
550.0000 [IU]/h | INTRAMUSCULAR | Status: DC
  Administered 2015-11-06 – 2015-11-07 (×2): 550 [IU]/h via INTRAVENOUS
  Filled 2015-11-06: qty 250

## 2015-11-06 MED ORDER — DEXTROSE 50 % IV SOLN
0.5000 | Freq: Once | INTRAVENOUS | Status: AC
  Administered 2015-11-06: 25 mL via INTRAVENOUS

## 2015-11-06 MED ORDER — CHLORHEXIDINE GLUCONATE 0.12% ORAL RINSE (MEDLINE KIT)
15.0000 mL | Freq: Two times a day (BID) | OROMUCOSAL | Status: DC
  Administered 2015-11-06 – 2015-11-19 (×25): 15 mL via OROMUCOSAL

## 2015-11-06 MED ORDER — SODIUM CHLORIDE 0.9 % IV SOLN
1.0000 g | Freq: Once | INTRAVENOUS | Status: AC
  Administered 2015-11-06: 1 g via INTRAVENOUS
  Filled 2015-11-06: qty 10

## 2015-11-06 MED ORDER — DEXTROSE 5 % IV SOLN: 2.0000 g | INTRAVENOUS | Status: DC

## 2015-11-06 MED ORDER — ANTISEPTIC ORAL RINSE SOLUTION (CORINZ)
7.0000 mL | Freq: Four times a day (QID) | OROMUCOSAL | Status: DC
  Administered 2015-11-06: 7 mL via OROMUCOSAL

## 2015-11-06 MED ORDER — SODIUM CHLORIDE 0.9% FLUSH
3.0000 mL | Freq: Two times a day (BID) | INTRAVENOUS | Status: DC
  Administered 2015-11-06 – 2015-11-19 (×24): 3 mL via INTRAVENOUS

## 2015-11-06 MED ORDER — CHLORHEXIDINE GLUCONATE 0.12% ORAL RINSE (MEDLINE KIT)
15.0000 mL | Freq: Two times a day (BID) | OROMUCOSAL | Status: DC
  Administered 2015-11-06: 15 mL via OROMUCOSAL

## 2015-11-06 MED ORDER — SODIUM CHLORIDE 0.9 % IV SOLN: 250.0000 mL | INTRAVENOUS | Status: DC | PRN

## 2015-11-06 MED ORDER — FENTANYL CITRATE (PF) 100 MCG/2ML IJ SOLN
50.0000 ug | Freq: Once | INTRAMUSCULAR | Status: AC
  Administered 2015-11-06: 50 ug via INTRAVENOUS

## 2015-11-06 MED ORDER — NOREPINEPHRINE BITARTRATE 1 MG/ML IV SOLN
2.0000 ug/min | INTRAVENOUS | Status: DC
  Administered 2015-11-06: 15 ug/min via INTRAVENOUS
  Administered 2015-11-06: 5 ug/min via INTRAVENOUS
  Filled 2015-11-06 (×2): qty 16

## 2015-11-06 MED ORDER — MIDAZOLAM HCL 2 MG/2ML IJ SOLN
1.0000 mg | INTRAMUSCULAR | Status: DC | PRN
  Administered 2015-11-08 – 2015-11-09 (×4): 1 mg via INTRAVENOUS
  Filled 2015-11-06 (×3): qty 2

## 2015-11-06 MED ORDER — OSELTAMIVIR PHOSPHATE 6 MG/ML PO SUSR
30.0000 mg | Freq: Every day | ORAL | Status: DC
  Administered 2015-11-06: 30 mg via ORAL
  Filled 2015-11-06 (×3): qty 5

## 2015-11-06 MED ORDER — DEXTROSE 50 % IV SOLN
INTRAVENOUS | Status: AC
  Administered 2015-11-06: 25 mL via INTRAVENOUS
  Filled 2015-11-06: qty 50

## 2015-11-06 MED ORDER — SODIUM BICARBONATE 8.4 % IV SOLN
INTRAVENOUS | Status: DC
  Administered 2015-11-06 – 2015-11-07 (×3): via INTRAVENOUS
  Filled 2015-11-06 (×6): qty 150

## 2015-11-06 MED ORDER — SODIUM CHLORIDE 0.9% FLUSH
10.0000 mL | Freq: Two times a day (BID) | INTRAVENOUS | Status: DC
  Administered 2015-11-06 – 2015-11-09 (×7): 10 mL
  Administered 2015-11-09: 20 mL
  Administered 2015-11-10 – 2015-11-12 (×6): 10 mL
  Administered 2015-11-13: 20 mL
  Administered 2015-11-13 – 2015-11-14 (×2): 10 mL
  Administered 2015-11-15: 30 mL

## 2015-11-06 MED ORDER — MIDAZOLAM HCL 2 MG/2ML IJ SOLN
INTRAMUSCULAR | Status: AC
  Filled 2015-11-06: qty 2

## 2015-11-06 MED ORDER — INSULIN ASPART 100 UNIT/ML ~~LOC~~ SOLN
0.0000 [IU] | SUBCUTANEOUS | Status: DC
  Administered 2015-11-06 (×2): 3 [IU] via SUBCUTANEOUS
  Administered 2015-11-06 – 2015-11-07 (×2): 2 [IU] via SUBCUTANEOUS
  Administered 2015-11-07: 3 [IU] via SUBCUTANEOUS
  Administered 2015-11-08 (×2): 2 [IU] via SUBCUTANEOUS
  Administered 2015-11-09 (×2): 3 [IU] via SUBCUTANEOUS
  Administered 2015-11-09 – 2015-11-10 (×2): 2 [IU] via SUBCUTANEOUS
  Administered 2015-11-10 (×2): 3 [IU] via SUBCUTANEOUS
  Administered 2015-11-10 – 2015-11-11 (×2): 2 [IU] via SUBCUTANEOUS
  Administered 2015-11-11 (×2): 3 [IU] via SUBCUTANEOUS
  Administered 2015-11-12 (×3): 2 [IU] via SUBCUTANEOUS
  Administered 2015-11-12: 3 [IU] via SUBCUTANEOUS
  Administered 2015-11-12 – 2015-11-13 (×2): 2 [IU] via SUBCUTANEOUS
  Administered 2015-11-13 (×2): 3 [IU] via SUBCUTANEOUS
  Administered 2015-11-13: 2 [IU] via SUBCUTANEOUS
  Administered 2015-11-14: 3 [IU] via SUBCUTANEOUS
  Administered 2015-11-14 – 2015-11-15 (×7): 2 [IU] via SUBCUTANEOUS
  Administered 2015-11-15: 3 [IU] via SUBCUTANEOUS
  Administered 2015-11-15: 2 [IU] via SUBCUTANEOUS
  Administered 2015-11-15: 3 [IU] via SUBCUTANEOUS
  Administered 2015-11-15 – 2015-11-17 (×6): 2 [IU] via SUBCUTANEOUS
  Administered 2015-11-17 – 2015-11-18 (×2): 3 [IU] via SUBCUTANEOUS

## 2015-11-06 MED ORDER — PIPERACILLIN-TAZOBACTAM IN DEX 2-0.25 GM/50ML IV SOLN
2.2500 g | Freq: Four times a day (QID) | INTRAVENOUS | Status: DC
  Administered 2015-11-06 – 2015-11-07 (×4): 2.25 g via INTRAVENOUS
  Filled 2015-11-06 (×6): qty 50

## 2015-11-06 MED ORDER — FENTANYL BOLUS VIA INFUSION
25.0000 ug | INTRAVENOUS | Status: DC | PRN
  Administered 2015-11-13 – 2015-11-19 (×4): 25 ug via INTRAVENOUS
  Filled 2015-11-06: qty 25

## 2015-11-06 MED ORDER — HEPARIN (PORCINE) IN NACL 2-0.9 UNIT/ML-% IJ SOLN
INTRAMUSCULAR | Status: AC
  Filled 2015-11-06: qty 1000

## 2015-11-06 MED ORDER — POTASSIUM CHLORIDE 10 MEQ/50ML IV SOLN
10.0000 meq | INTRAVENOUS | Status: AC
  Administered 2015-11-06 (×2): 10 meq via INTRAVENOUS
  Filled 2015-11-06 (×2): qty 50

## 2015-11-06 MED ORDER — ATORVASTATIN CALCIUM 80 MG PO TABS
80.0000 mg | ORAL_TABLET | Freq: Every day | ORAL | Status: DC
  Administered 2015-11-06: 80 mg
  Filled 2015-11-06: qty 1

## 2015-11-06 MED ORDER — ASPIRIN 81 MG PO CHEW
81.0000 mg | CHEWABLE_TABLET | Freq: Every day | ORAL | Status: DC
  Administered 2015-11-06 – 2015-11-07 (×2): 81 mg
  Filled 2015-11-06 (×3): qty 1

## 2015-11-06 MED ORDER — VANCOMYCIN HCL 500 MG IV SOLR: 500.0000 mg | INTRAVENOUS | Status: DC

## 2015-11-06 MED ORDER — LIDOCAINE HCL (PF) 1 % IJ SOLN
INTRAMUSCULAR | Status: DC | PRN
  Administered 2015-11-06: 20 mL

## 2015-11-06 MED ORDER — HEPARIN SODIUM (PORCINE) 5000 UNIT/ML IJ SOLN
5000.0000 [IU] | Freq: Three times a day (TID) | INTRAMUSCULAR | Status: DC
  Administered 2015-11-06: 5000 [IU] via SUBCUTANEOUS
  Filled 2015-11-06: qty 1

## 2015-11-06 MED ORDER — PANTOPRAZOLE SODIUM 40 MG IV SOLR
40.0000 mg | Freq: Every day | INTRAVENOUS | Status: DC
  Administered 2015-11-06 – 2015-11-10 (×5): 40 mg via INTRAVENOUS
  Filled 2015-11-06 (×6): qty 40

## 2015-11-06 MED ORDER — VANCOMYCIN HCL 500 MG IV SOLR
500.0000 mg | INTRAVENOUS | Status: DC
  Administered 2015-11-07: 500 mg via INTRAVENOUS
  Filled 2015-11-06: qty 500

## 2015-11-06 MED ORDER — LIDOCAINE HCL (PF) 1 % IJ SOLN
INTRAMUSCULAR | Status: AC
  Filled 2015-11-06: qty 30

## 2015-11-06 MED ORDER — ALBUTEROL SULFATE (2.5 MG/3ML) 0.083% IN NEBU: 2.5000 mg | INHALATION_SOLUTION | RESPIRATORY_TRACT | Status: DC | PRN

## 2015-11-06 MED ORDER — SODIUM CHLORIDE 0.9 % IV SOLN
25.0000 ug/h | INTRAVENOUS | Status: DC
  Administered 2015-11-06 – 2015-11-07 (×2): 50 ug/h via INTRAVENOUS
  Administered 2015-11-08: 150 ug/h via INTRAVENOUS
  Administered 2015-11-09 – 2015-11-10 (×3): 200 ug/h via INTRAVENOUS
  Administered 2015-11-12: 125 ug/h via INTRAVENOUS
  Administered 2015-11-12: 100 ug/h via INTRAVENOUS
  Administered 2015-11-13: 400 ug/h via INTRAVENOUS
  Administered 2015-11-13: 150 ug/h via INTRAVENOUS
  Administered 2015-11-14: 200 ug/h via INTRAVENOUS
  Administered 2015-11-14: 150 ug/h via INTRAVENOUS
  Administered 2015-11-15 – 2015-11-19 (×9): 200 ug/h via INTRAVENOUS
  Filled 2015-11-06 (×23): qty 50

## 2015-11-06 MED ORDER — SODIUM CHLORIDE 0.9 % IV SOLN
INTRAVENOUS | Status: DC
  Administered 2015-11-06: 100 mL/h via INTRAVENOUS

## 2015-11-06 MED ORDER — MIDAZOLAM HCL 2 MG/2ML IJ SOLN
1.0000 mg | INTRAMUSCULAR | Status: DC | PRN
  Filled 2015-11-06: qty 2

## 2015-11-06 MED ORDER — CEFTRIAXONE SODIUM 1 G IJ SOLR
1.0000 g | INTRAMUSCULAR | Status: DC
Start: 2015-11-06 — End: 2015-11-06
  Administered 2015-11-06: 1 g via INTRAVENOUS
  Filled 2015-11-06: qty 10

## 2015-11-06 MED ORDER — ATORVASTATIN CALCIUM 20 MG PO TABS: 20.0000 mg | ORAL_TABLET | Freq: Every day | ORAL | Status: DC

## 2015-11-06 MED ORDER — MIDAZOLAM HCL 2 MG/2ML IJ SOLN
INTRAMUSCULAR | Status: DC | PRN
  Administered 2015-11-06: 1 mg via INTRAVENOUS

## 2015-11-06 MED FILL — Medication: Qty: 1 | Status: AC

## 2015-11-06 SURGICAL SUPPLY — 6 items
BALLN LINEAR 7.5FR IABP 40CC (BALLOONS) ×3
BALLOON LINEAR 7.5FR IABP 40CC (BALLOONS) ×1 IMPLANT
DEVICE SECURE STATLOCK IABP (MISCELLANEOUS) ×6 IMPLANT
PACK CARDIAC CATHETERIZATION (CUSTOM PROCEDURE TRAY) ×3 IMPLANT
PROTECTION STATION PRESSURIZED (MISCELLANEOUS) ×3
STATION PROTECTION PRESSURIZED (MISCELLANEOUS) ×1 IMPLANT

## 2015-11-06 NOTE — Progress Notes (Addendum)
Spoke with Oncall MD regarding discrepancy noted between IABP pressures and arterial line blood pressures. IABP reflushed and zeroed, transducer level, timing on IABP reassessed with fellow RN. No changes made. MD ordered CXR to check placement, but states to titrate pressors according to arterial line blood pressure (which correlates with manual BP). Will continue to monitor closely.

## 2015-11-06 NOTE — Progress Notes (Signed)
Patient ID: William Mccullough, male   DOB: [birth year], 70 y.o.   MRN: [id number]  IABP now in, augmenting at 1:1.  BP higher, titrating down norepinephrine.  Echo done, EF 15-20%.  Eventually should have cath, but will follow renal function and neurological status for now.   CVP 11-12.  I think we can stop IV fluid at this point.    Will send co-ox now.  He has started to make urine.   Loralie Champagne 11/09/2015 1:13 PM

## 2015-11-06 NOTE — H&P (Signed)
PULMONARY / CRITICAL CARE MEDICINE   Name: William Mccullough MRN: [id number] DOB: 31-Oct-1945    ADMISSION DATE:  11/18/2015 CONSULTATION DATE:  11/14/2015  REFERRING MD:  EDP  CHIEF COMPLAINT:  SOB  HISTORY OF PRESENT ILLNESS:  Pt is encephelopathic; therefore, this HPI is obtained from chart review. William Mccullough is a 70 y.o. male with PMH as outlined below. He was brought to [facility] ED 03/01 after friends he reportedly lives with found him unresponsive and foaming at the mouth.  On EMS arrival, he was alert but had SpO2 of 40%.  He was placed on CPAP and had improvement in sats to 81%.  He was transported to [facility] ED where he remained hypoxic and was in respiratory distress.  He was subsequently intubated.  Following intubation, his heart rate began to drop down into the 40's.  He then lost pulses briefly for 1 - 2 minutes before ROSC.  Following ROSC, he had a wide complex tachycardia for which he was bolused with 300mg  amiodarone.  He was also hypotensive post intubation (he received etomidate and rocuronium prior to intubation) and was therefore placed on norepinephrine infusion (this is currently being weaned to off).  Per EDP, bedside cardiac POCUS revealed very poor EF, estimated at 10-15%.  PCCM was called for admission.  PAST MEDICAL HISTORY :  He  has a past medical history of Hypertension; Heart murmur; Cough; Cold; H/O acute pancreatitis; Prostate cancer; Cardiomyopathy; Elevated PSA; Anxiety; Depression; Undiagnosed cardiac murmurs; and History of radiation therapy (11/24/11 - 01/24/12).  PAST SURGICAL HISTORY: He  has past surgical history that includes Hemorrhoid surgery; Hand nerve repair; Prostate biopsy (12/21/2010); and Cardiac catheterization (10/07/2011).  No Known Allergies  No current facility-administered medications on file prior to encounter.   Current Outpatient Prescriptions on File Prior to Encounter  Medication Sig  . amLODipine (NORVASC) 10 MG tablet Take 1 tablet (10  mg total) by mouth daily.  Marland Kitchen aspirin 81 MG tablet Take 81 mg by mouth daily.  Marland Kitchen atorvastatin (LIPITOR) 20 MG tablet Take 1 tablet (20 mg total) by mouth daily.  Marland Kitchen buPROPion (WELLBUTRIN SR) 150 MG 12 hr tablet Take 1 tablet (150 mg total) by mouth 2 (two) times daily.  . carvedilol (COREG) 12.5 MG tablet Take 1 tablet (12.5 mg total) by mouth 2 (two) times daily.  . cloNIDine (CATAPRES) 0.1 MG tablet Take 1 tablet (0.1 mg total) by mouth 2 (two) times daily.  . isosorbide mononitrate (IMDUR) 60 MG 24 hr tablet Take 1 tablet (60 mg total) by mouth every morning.  . phenazopyridine (PYRIDIUM) 200 MG tablet Take 200 mg by mouth 3 (three) times daily as needed. Pyridium 200 mg tablet take one by mouth tid prn painful urination dispense 60 one refill called to [facility] at [place]. 12/22/2011 at 1135  . sildenafil (VIAGRA) 25 MG tablet Take 25 mg by mouth daily as needed for erectile dysfunction.  . solifenacin (VESICARE) 5 MG tablet Take 5 mg by mouth daily.  . Tamsulosin HCl (FLOMAX) 0.4 MG CAPS Take 0.4 mg by mouth daily. Flomax 0.4mg  one tablet by mouth per day; qty 30; 3 refills; called into [facility] at [place]. 12/22/11 at 1134  . telmisartan-hydrochlorothiazide (MICARDIS HCT) 80-25 MG per tablet Take 1 tablet by mouth daily.    FAMILY HISTORY:  His has no family status information on file.   SOCIAL HISTORY: He  reports that he has been smoking Cigarettes.  He has  a 40 pack-year smoking history. He has never used smokeless tobacco. He reports that he drinks about 7.0 oz of alcohol per week. He reports that he does not use illicit drugs.  REVIEW OF SYSTEMS:   Unable to obtain as pt is encephalopathic.  SUBJECTIVE:  On vent, non-responsive.  VITAL SIGNS: BP 136/95 mmHg  Pulse 98  Temp(Src) 96.5 F (35.8 C) (Rectal)  Resp 20  SpO2 96%  HEMODYNAMICS:    VENTILATOR SETTINGS: Vent Mode:  [-] PRVC FiO2 (%):  [100 %] 100 % Set Rate:   [14 bmp-20 bmp] 20 bmp Vt Set:  [540 mL-570 mL] 570 mL PEEP:  [5 cmH20] 5 cmH20 Plateau Pressure:  [18 cmH20-24 cmH20] 24 cmH20  INTAKE / OUTPUT:     PHYSICAL EXAMINATION: General: AA male, thin, in NAD. Neuro: Sedated, non-responsive. HEENT: [place]/AT. PERRL, sclerae anicteric. Cardiovascular: RRR, no M/R/G.  Lungs: Respirations even and unlabored.  Coarse bilaterally. Abdomen: BS x 4, soft, NT/ND.  Musculoskeletal: No gross deformities, no edema.  Skin: Intact, warm, no rashes.  LABS:  BMET  Recent Labs Lab 11/29/2015 2229 11/11/2015 2237 11/11/2015 2255  NA 136 139 142  K 5.0 5.2* 4.1  CL 109 107 108  CO2  --   --  19*  BUN 18 19 13   CREATININE 1.60* 1.70* 1.90*  GLUCOSE 201* 199* 200*    Electrolytes  Recent Labs Lab 11/06/2015 2255  CALCIUM 8.3*    CBC  Recent Labs Lab 11/12/2015 2229 11/23/2015 2237 11/21/2015 2239  WBC  --   --  20.9*  HGB 15.0 15.6 12.9*  HCT 44.0 46.0 37.7*  PLT  --   --  175    Coag's  Recent Labs Lab 12/03/2015 2255  INR 1.22    Sepsis Markers  Recent Labs Lab 12/01/2015 2230  LATICACIDVEN 6.46*    ABG  Recent Labs Lab 11/08/2015 2302  PHART 7.060*  PCO2ART 61.5*  PO2ART 62.0*    Liver Enzymes  Recent Labs Lab 11/17/2015 2255  AST 166*  ALT 52  ALKPHOS 109  BILITOT 0.7  ALBUMIN 2.8*    Cardiac Enzymes No results for input(s): TROPONINI, PROBNP in the last 168 hours.  Glucose No results for input(s): GLUCAP in the last 168 hours.  Imaging Dg Chest Portable 1 View  11/30/2015  CLINICAL DATA:  Intubation. Respiratory distress. History of hypertension, prostate cancer. EXAM: PORTABLE CHEST 1 VIEW COMPARISON:  Chest radiograph September 08, 2011 FINDINGS: Endotracheal tube tip projects 2.3 cm above the carina. Nasogastric tube past the proximal stomach, distal tip not imaged. Multiple pacer pads and wires overlie the chest. The cardiac silhouette appears moderately enlarged, mediastinal silhouette is nonsuspicious.  Diffuse interstitial prominence, RIGHT greater than LEFT with pulmonary vascular congestion. No pleural effusions though LEFT costophrenic angle is not imaged. No pneumothorax. Small metallic linear density projecting at RIGHT chest wall may be the external to the patient or, represent needle fragment. Recommend direct inspection. IMPRESSION: Endotracheal tube tip projects 2.3 cm above the carina. Nasogastric tube past proximal stomach. Increasing cardiomegaly and findings of asymmetric interstitial pulmonary edema. Electronically Signed   By: Elon Alas M.D.   On: 11/11/2015 22:59   Dg Abd Portable 1v  11/13/2015  CLINICAL DATA:  70 year old male status post intubation and enteric tube placement. Respiratory distress. EXAM: PORTABLE ABDOMEN - 1 VIEW COMPARISON:  None. FINDINGS: An endotracheal tube is partially visualized extending into the left hemi abdomen with tip positioned in the left lower abdomen overlying the iliac crest.  Normal caliber air-filled loops of small bowel noted. There is cardiomegaly. There is diffuse hazy density of the right lung base. A 2 cm linear radiopaque density over the right lung base may be superimposed on the patient. Clinical correlation is recommended. IMPRESSION: Enteric tube the tip in the left lower abdomen superimposed over the iliac crest. Electronically Signed   By: Anner Crete M.D.   On: 11/16/2015 23:02     STUDIES:  CXR 03/01 > cardiomegaly with asymmetric interstitial edema right side.  CULTURES: Blood 03/01 > Urine 03/01 > Sputum 03/01 >  ANTIBIOTICS: Vanc 03/02 > Zosyn 03/02 >  SIGNIFICANT EVENTS: 03/02 > admitted with acute hypoxic respiratory failure requiring intubation.  Had brief code post intubation.  LINES/TUBES: ETT 03/02 >   ASSESSMENT / PLAN:  PULMONARY A: Acute hypoxic respiratory failure - required intubation in ED. Concern for CAP. P:   Full vent support. Wean as able. VAP prevention measures. SBT in AM if  able. Abx / cultures per ID section. Albuterol PRN. CXR in AM.  CARDIOVASCULAR A:  Sepsis of unclear etiology. Brief cardiac arrest post intubation - ROSC after 1 - 2 minutes.  Suspect related to hypotension due to RSI meds. Hypotension - suspect medication related due to RSI. Now gradually resolving and vasopressors being weaned down. Troponin leak - suspect due to demand ischemia. Hx HTN, cardiomyopathy (last echo from 2013 with EF 40-45%, grade 1DD.  Of note, per EDP, bedside cardiac POCUS revealed very poor EF estimated at 10-15%). P:  1L NS bolus. Wean levophed to off as able. If unable to wean levophed, will need CVL. Trend troponins / lactate. Echo. Cards consulted by EDP. Continue outpatient ASA, atorvastatin. Hold outpatient amlodipine, carvedilol, clonidine, imdur, micardis.  RENAL A:   AKI. AGMA - lactate. Hypocalcemia - corrects to 8.8. P:   NS @ 100. 1g Ca gluconate. BMP in AM.  GASTROINTESTINAL A:   GI prophylaxis. Nutrition. P:   SUP: Pantoprazole. NPO.  HEMATOLOGIC / ONCOLOGIC A:   VTE Prophylaxis. Hx prostate CA - last seen in 2013 and was doing well at that time. P:  SCD's / Heparin. CBC in AM.  INFECTIOUS A:   Sepsis of unclear etiology. P:   Abx as above (vanc / zosyn).  Follow cultures as above. PCT algorithm to limit abx exposure.  ENDOCRINE A:   Hyperglycemia - no hx DM. P:   SSI. Assess Hgb A1c.  NEUROLOGIC A:   Acute metabolic encephalopathy. Hx anxiety, depression. P:   Sedation:  Fentanyl gtt / Midazolam PRN. RASS goal: 0 to -1. Daily WUA. Assess UDS. Hold outpatient wellbutrin.  Family updated: None.  Interdisciplinary Family Meeting v Palliative Care Meeting:  Due by: 03/08.  CC time: 40 minutes.   Montey Hora, [place] [facility] Pager: [phone]  or [phone] 11/17/2015, 12:03 AM

## 2015-11-06 NOTE — Progress Notes (Signed)
UR Completed. Vesta Wheeland, RN, BSN.  [phone] 

## 2015-11-06 NOTE — H&P (View-Only) (Signed)
PULMONARY / CRITICAL CARE MEDICINE   Name: William Mccullough MRN: [id number] DOB: May 23, 1946    ADMISSION DATE:  11/11/2015 CONSULTATION DATE:  11/18/2015  REFERRING MD:  EDP  CHIEF COMPLAINT:  SOB  BRIEF:  70 y/o male admitted with acute hypoxemic respiratory failure on 3/1 to [facility],  Found to have bedside echo suggestive of EF 10-15%.  Developed a cardiac arrest again early AM 3/2.    SUBJECTIVE:  Cardiac arrest this morning at 0630, PCCM called emergently to bedside  VITAL SIGNS: BP 80/27 mmHg  Pulse 95  Temp(Src) 93.2 F (34 C) (Rectal)  Resp 34  Ht 5\' 9"  (1.753 m)  Wt 48.7 kg (107 lb 5.8 oz)  BMI 15.85 kg/m2  SpO2 98%  HEMODYNAMICS:    VENTILATOR SETTINGS: Vent Mode:  [-] PRVC FiO2 (%):  [100 %] 100 % Set Rate:  [14 bmp-20 bmp] 20 bmp Vt Set:  [540 mL-570 mL] 570 mL PEEP:  [5 cmH20-10 cmH20] 10 cmH20 Plateau Pressure:  [18 cmH20-28 cmH20] 25 cmH20  INTAKE / OUTPUT: I/O last 3 completed shifts: In: 1340.4 [I.V.:1340.4] Out: 270 [Urine:70; Drains:200]   PHYSICAL EXAMINATION: General: thin male, on vent HENT: NCAT ETT in place, pupils pinpoint Pulmonary: rhonchi bilaterally CV: RRR, slight systolic murmur GI: BS+, soft, nontender MSK: normal bulk, cannot assess tone Derm: no rash or skin breakdown Neuro: sedated on vent, does not withdraw to pain  LABS:  BMET  Recent Labs Lab 11/09/2015 2255 12/04/2015 0245 11/11/2015 0657  NA 142 143 144  K 4.1 3.9 4.3  CL 108 109 111  CO2 19* 17* 19*  BUN 13 14 17   CREATININE 1.90* 1.69* 2.11*  GLUCOSE 200* 140* 50*    Electrolytes  Recent Labs Lab 11/11/2015 2255 12/03/2015 0245 11/13/2015 0657  CALCIUM 8.3* 8.5* 10.1  MG  --  2.0  --   PHOS  --  5.3*  --     CBC  Recent Labs Lab 12/04/2015 2239 11/25/2015 0245 11/28/2015 0657  WBC 20.9* 10.6* 7.5  HGB 12.9* 13.5 12.1*  HCT 37.7* 41.1 35.1*  PLT 175 177 134*    Coag's  Recent Labs Lab 11/20/2015 2255  INR 1.22    Sepsis Markers  Recent  Labs Lab 11/16/2015 2230 11/22/2015 0120 11/29/2015 0245  LATICACIDVEN 6.46* 6.5* 7.5*  PROCALCITON  --  1.66  --     ABG  Recent Labs Lab 11/10/2015 0034 11/18/2015 0420 12/04/2015 0657  PHART 7.238* 7.188* 7.115*  PCO2ART 38.5 46.2* 66.5*  PO2ART 61.0* 94.0 63.0*    Liver Enzymes  Recent Labs Lab 11/22/2015 2255  AST 166*  ALT 52  ALKPHOS 109  BILITOT 0.7  ALBUMIN 2.8*    Cardiac Enzymes  Recent Labs Lab 11/14/2015 0120 11/27/2015 0245 12/04/2015 0657  TROPONINI 3.64* 10.60* 31.57*    Glucose  Recent Labs Lab 11/12/2015 0054 12/02/2015 0325  GLUCAP 157* 125*    Imaging CXR images personally reviewed> airspace disease bilaterally R > L, ETT in place  STUDIES:   CULTURES: Blood 03/01 > Urine 03/01 > Sputum 03/01 > Ur Strep 3/2 positive Influenza PCR 3/2 > negative Respiratory viral panel 3/2 >   ANTIBIOTICS: Vanc 03/02 > Zosyn 03/02 > Tamiflu 3/2 >   SIGNIFICANT EVENTS: 03/02 > admitted with acute hypoxic respiratory failure requiring intubation.  Had brief code post intubation.  LINES/TUBES: ETT 03/02 > L IJ CVL 3/2 >    ASSESSMENT / PLAN:  70 y/o male with known ischemic cardiomyopathy admitted on  3/2 with acute hypoxemic respiratory failure "foaming at mouth" and had a cardiac arrest upon induction for intubation.  Since admission he had developed progressive severe shock which is presumably cardiogenic as his bedside echo showed an EF of 10-15% and his troponins are elevated.  His strep pneumonia antigen was positive and he had a leukocytosis on admission, so part of this may be septic shock but given the cardiac issues I believe there is a significant cardiogenic component to his shock.  DDx includes influenza pneumonia/myocarditis.  He also has worsening renal failure.  Prognosis guarded at best.    PULMONARY A: Acute hypoxic respiratory failure Acute pulmonary edema Severe CAP Tobacco abuse P:   Continue full vent support Increase PEEP 14,  Decrease FiO2 60% and then repeat ABG Increase RR 28 Target PaO2 55-65 Wean as able VAP prevention measures Albuterol PRN CXR in AM  CARDIOVASCULAR A:  Rapidly progressive shock> cardiogenic and possibly septic PEA Arrest x2 NSTEMi P:  Balloon pump now Levophed now Vasopressin now Treat acidosis now Echo now CVP monitoring now Heparin infusion ASA/Plavix per cardiology LHC per cardiology  RENAL A:   AKI > worsening, due to shock AGMA - lactate> worsening Hypocalcemia - corrects to 8.8 P:   Bicarb gtt Renal consult > suspect he will need CVVHD Monitor BMET and UOP Replace electrolytes as needed   GASTROINTESTINAL A:   GI prophylaxis Nutrition P:   SUP: Pantoprazole NPO  HEMATOLOGIC / ONCOLOGIC A:   VTE Prophylaxis Hx prostate CA - last seen in 2013 and was doing well at that time P:  SCD's / Heparin CBC in AM  INFECTIOUS A:   Severe CAP? Influenza? Strep pneumo antigen positive P:   Vanc, increase ceftriaxone, continue azithro Follow cultures Empiric tamiflu until resp viral panel back  ENDOCRINE A:   Hyperglycemia - no hx DM P:   SSI Assess Hgb A1c  NEUROLOGIC A:   Acute metabolic encephalopathy Hx anxiety, depression P:   Sedation:  Fentanyl gtt / Midazolam PRN RASS goal: 0 to -1 Daily WUA Assess UDS Hold outpatient wellbutrin  Family updated: None.  Interdisciplinary Family Meeting v Palliative Care Meeting:  Due by: 03/08.  Additional CC time by me this morning outside procedures: 75 minutes  Roselie Awkward, MD [place] [facility] Pager: [phone] Cell: [phone] After 3pm or if no response, call [phone]   12/03/2015, 7:56 AM

## 2015-11-06 NOTE — Procedures (Signed)
Arterial Catheter Insertion Procedure Note William Mccullough [id number] 1946-04-28  Procedure: Insertion of Arterial Catheter  Indications: Blood pressure monitoring  Procedure Details Consent: Unable to obtain consent because of emergent medical necessity. Time Out: Verified patient identification, verified procedure, site/side was marked, verified correct patient position, special equipment/implants available, medications/allergies/relevent history reviewed, required imaging and test results available.  Performed  Maximum sterile technique was used including antiseptics, cap, gloves, gown, hand hygiene, mask and sheet. Skin prep: Chlorhexidine; local anesthetic administered 20 gauge catheter was inserted into left radial artery using the Seldinger technique.  Evaluation Blood flow good; BP tracing good. Complications: No apparent complications. RT placed arrow catheter on first attempt.   [name] 11/14/2015

## 2015-11-06 NOTE — Progress Notes (Signed)
ANTICOAGULATION CONSULT NOTE  Pharmacy Consult for heparin Indication: chest pain/ACS; s/p IABP placement  No Known Allergies  Patient Measurements: Height: 5\' 9"  (175.3 cm) Weight: 107 lb 5.8 oz (48.7 kg) IBW/kg (Calculated) : 70.7  Vital Signs: Temp: 93.2 F (34 C) (03/02 0600) Temp Source: Core (Comment) (03/02 0400) BP: 80/27 mmHg (03/02 0600) Pulse Rate: 95 (03/02 0600)  Labs:  Recent Labs  11/18/2015 2239 11/10/2015 2255 12/01/2015 0120 11/17/2015 0245 11/10/2015 0657  HGB 12.9*  --   --  13.5 12.1*  HCT 37.7*  --   --  41.1 35.1*  PLT 175  --   --  177 134*  LABPROT  --  15.6*  --   --   --   INR  --  1.22  --   --   --   CREATININE  --  1.90*  --  1.69* 2.11*  TROPONINI  --   --  3.64* 10.60* 31.57*    Estimated Creatinine Clearance: 23.4 mL/min (by C-G formula based on Cr of 2.11).   Medical History: Past Medical History  Diagnosis Date  . Hypertension   . Heart murmur     pt told he has heart murmur-states it does not cause him any problems  . Cough     09/08/11  NEW ONSET OF COUGH--STARTED 09/04/11 - NO FEVER--PT THINKS HE MAY BE GETTING A COLD  . Cold     Head congestion, non productive cough, afebrile  . H/O acute pancreatitis   . Prostate cancer   . Cardiomyopathy   . Elevated PSA   . Anxiety   . Depression   . Undiagnosed cardiac murmurs     functional murmur  . History of radiation therapy 11/24/11 - 01/24/12    prostate, seminal vesicles    Medications:  Prescriptions prior to admission  Medication Sig Dispense Refill Last Dose  . amLODipine (NORVASC) 10 MG tablet Take 1 tablet (10 mg total) by mouth daily. 30 tablet 11 Taking  . aspirin 81 MG tablet Take 81 mg by mouth daily.     Marland Kitchen atorvastatin (LIPITOR) 20 MG tablet Take 1 tablet (20 mg total) by mouth daily. 30 tablet 11 Taking  . buPROPion (WELLBUTRIN SR) 150 MG 12 hr tablet Take 1 tablet (150 mg total) by mouth 2 (two) times daily. 60 tablet 2 Taking  . carvedilol (COREG) 12.5 MG tablet  Take 1 tablet (12.5 mg total) by mouth 2 (two) times daily. 180 tablet 3 Taking  . cloNIDine (CATAPRES) 0.1 MG tablet Take 1 tablet (0.1 mg total) by mouth 2 (two) times daily. 60 tablet 11 Taking  . isosorbide mononitrate (IMDUR) 60 MG 24 hr tablet Take 1 tablet (60 mg total) by mouth every morning. 30 tablet 11 Taking  . phenazopyridine (PYRIDIUM) 200 MG tablet Take 200 mg by mouth 3 (three) times daily as needed. Pyridium 200 mg tablet take one by mouth tid prn painful urination dispense 60 one refill called to [facility] [place]. 12/22/2011 at 1135   Taking  . sildenafil (VIAGRA) 25 MG tablet Take 25 mg by mouth daily as needed for erectile dysfunction.     . solifenacin (VESICARE) 5 MG tablet Take 5 mg by mouth daily.     . Tamsulosin HCl (FLOMAX) 0.4 MG CAPS Take 0.4 mg by mouth daily. Flomax 0.4mg  one tablet by mouth per day; qty 30; 3 refills; called into [facility] [place]. 12/22/11 at 1134  Taking  . telmisartan-hydrochlorothiazide (MICARDIS HCT) 80-25 MG per tablet Take 1 tablet by mouth daily. 30 tablet 11 Taking   Scheduled:   Infusions:  . sodium chloride 100 mL/hr at 12/03/2015 0600  . fentaNYL infusion INTRAVENOUS Stopped (11/18/2015 0600)  . heparin Stopped (11/26/2015 [id number])  . norepinephrine (LEVOPHED) Adult infusion 20 mcg/min (11/09/2015 0903)  .  sodium bicarbonate  infusion 1000 mL 150 mL/hr at 11/05/2015 0801  . vasopressin (PITRESSIN) infusion - *FOR SHOCK*      Assessment: 70yo male found unresponsive by friends and foaming at the mouth, intubated and started on ABX for sepsis and possible PNA, now w/ elevated troponin, started on IV heparin.  Heparin held during code overnight due to blood in ET tube, then resumed.  Held again on call to cath lab this AM.  Patient now has IABP in place, and pharmacy asked to start IV heparin upon arrival to [facility].  Goal of Therapy:  Heparin level 0.2-0.5 while on IABP Monitor platelets by  anticoagulation protocol: Yes   Plan:  1. Resume heparin gtt at 550 units/hr. 2. Check heparin level 8 hrs after gtt starts. 3. Daily heparin level and CBC.  Uvaldo Rising, BCPS  Clinical Pharmacist Pager [phone]  11/28/2015 9:14 AM

## 2015-11-06 NOTE — Progress Notes (Signed)
  Echocardiogram 2D Echocardiogram has been performed.  Darlina Sicilian M 12/01/2015, 10:56 AM

## 2015-11-06 NOTE — Progress Notes (Signed)
Pharmacy Antibiotic Note  William Mccullough is a 70 y.o. male admitted on 11/10/2015 with pneumonia.  Pharmacy has been consulted for Ceftriaxone dosing.  Plan: -Ceftriaxone 1g IV q24h -Azithromycin per MD -F/U infectious work-up  Height: 5\' 9"  (175.3 cm) Weight: 107 lb 5.8 oz (48.7 kg) IBW/kg (Calculated) : 70.7  Temp (24hrs), Avg:95.4 F (35.2 C), Min:94.5 F (34.7 C), Max:96.5 F (35.8 C)   Recent Labs Lab 11/24/2015 2229 11/16/2015 2230 11/30/2015 2237 11/26/2015 2239 11/18/2015 2255 11/25/2015 0120 11/08/2015 0245  WBC  --   --   --  20.9*  --   --  10.6*  CREATININE 1.60*  --  1.70*  --  1.90*  --  1.69*  LATICACIDVEN  --  6.46*  --   --   --  6.5* 7.5*    Estimated Creatinine Clearance: 29.2 mL/min (by C-G formula based on Cr of 1.69).    No Known Allergies   William Mccullough 12/04/2015 3:42 AM

## 2015-11-06 NOTE — Progress Notes (Signed)
Chaplain in unit when code was called.  Standing by and waiting for any family that arrives or staff support.  Rev. [place], [place]

## 2015-11-06 NOTE — Progress Notes (Signed)
Pharmacy Antibiotic Note  William Mccullough is a 70 y.o. male admitted on 11/29/2015 with sepsis and concern for CAP.  Pharmacy has been consulted for Vancocin and Zosyn dosing.  Plan: Rec'd vanc 1g and Zosyn 3.375g IV in ED. Vancomycin 500 IV every 24 hours.  Goal trough 15-20 mcg/mL. Zosyn 3.375g IV q8h (4 hour infusion).  CBC, levels, Cx.  Height: 5\' 9"  (175.3 cm) Weight: 107 lb 5.8 oz (48.7 kg) IBW/kg (Calculated) : 70.7  Temp (24hrs), Avg:95.8 F (35.4 C), Min:95 F (35 C), Max:96.5 F (35.8 C)   Recent Labs Lab 11/27/2015 2229 11/12/2015 2230 11/20/2015 2237 11/28/2015 2239 12/05/2015 2255  WBC  --   --   --  20.9*  --   CREATININE 1.60*  --  1.70*  --  1.90*  LATICACIDVEN  --  6.46*  --   --   --     Estimated Creatinine Clearance: 26 mL/min (by C-G formula based on Cr of 1.9).    No Known Allergies  Antimicrobials this admission: Vanc 03/02 > Zosyn 03/02 >   Microbiology results: Blood 03/01 > Urine 03/01 > Sputum 03/01 >  Thank you for allowing pharmacy to be a part of this patient's care.  Wynona Neat, PharmD, BCPS  11/21/2015 1:08 AM

## 2015-11-06 NOTE — Progress Notes (Signed)
CSW has spoken with [employer] re: locating Patient's family and they are sending deputy out to Patient's address. CSW will continue to follow.  Holly Bodily, LCSW [facility] Clinical Social Worker [phone]

## 2015-11-06 NOTE — Procedures (Signed)
Central Venous Catheter Insertion Procedure Note William Mccullough [id number] Apr 24, 1946  Procedure: Insertion of Central Venous Catheter Indications: Assessment of intravascular volume  Procedure Details Consent: Unable to obtain consent because of emergent medical necessity. Time Out: Verified patient identification, verified procedure, site/side was marked, verified correct patient position, special equipment/implants available, medications/allergies/relevent history reviewed, required imaging and test results available.  Performed  Maximum sterile technique was used including antiseptics, cap, gloves, gown, hand hygiene, mask and sheet. Skin prep: Chlorhexidine; local anesthetic administered A antimicrobial bonded/coated triple lumen catheter was placed in the left internal jugular vein using the Seldinger technique.  Ultrasound was used to verify the patency of the vein and for real time needle guidance.  Evaluation Blood flow good Complications: No apparent complications Patient did tolerate procedure well. Chest X-ray ordered to verify placement.  CXR: pending.  Simonne Maffucci 11/15/2015, 7:55 AM

## 2015-11-06 NOTE — Consult Note (Signed)
Reason for Consult:AKI  Referring Physician: Dr. Valentina Shaggy is an 70 y.o. male.  HPI: 70 yr male with hx Prostate Ca, s/p Radrx, hx Depression, pancreatitis, HTN, presented after a few d of feeling bad, with frothing at mouth , pulm infiltrates.  Proceeded to arrest in ED with entub.  Resuscitated, found to have EF 10% and ^^trop.  Has been hypotensive on pressors, arrested again, had IABP placed.   Cr baseline 1.2-1.6.  Cr 1.9 on admit and this am 2.1.  Has AGMA and on pressors.  Oliguric but after balloon in has started making urine.  On ARB as outpatient for HTN. Review of systems not obtained due to patient factors.   Past Medical History  Diagnosis Date  . Hypertension   . Heart murmur     pt told he has heart murmur-states it does not cause him any problems  . Cough     09/08/11  NEW ONSET OF COUGH--STARTED 09/04/11 - NO FEVER--PT THINKS HE MAY BE GETTING A COLD  . Cold     Head congestion, non productive cough, afebrile  . H/O acute pancreatitis   . Prostate cancer ([place])   . Cardiomyopathy   . Elevated PSA   . Anxiety   . Depression   . Undiagnosed cardiac murmurs     functional murmur  . History of radiation therapy 11/24/11 - 01/24/12    prostate, seminal vesicles    Past Surgical History  Procedure Laterality Date  . Hemorrhoid surgery      history of destruction of external hemorrhoids  . Hand nerve repair      history of right hand repair surgery  . Prostate biopsy  12/21/2010  . Cardiac catheterization  10/07/2011  . Cardiac catheterization N/A 11/08/2015    Procedure: IABP Insertion;  Surgeon: Belva Crome, MD;  Location: [place] [facility];  Service: Cardiovascular;  Laterality: N/A;    Family History  Problem Relation Age of Onset  . Diabetes type II Sister   . Hypertension Sister     Social History:  reports that he has been smoking Cigarettes.  He has a 40 pack-year smoking history. He has never used smokeless tobacco. He reports that he drinks  about 7.0 oz of alcohol per week. He reports that he does not use illicit drugs.  Allergies: No Known Allergies  Medications:  I have reviewed the patient's current medications. Prior to Admission:  Prescriptions prior to admission  Medication Sig Dispense Refill Last Dose  . amLODipine (NORVASC) 10 MG tablet Take 1 tablet (10 mg total) by mouth daily. 30 tablet 11 Taking  . aspirin 81 MG tablet Take 81 mg by mouth daily.     Marland Kitchen atorvastatin (LIPITOR) 20 MG tablet Take 1 tablet (20 mg total) by mouth daily. 30 tablet 11 Taking  . buPROPion (WELLBUTRIN SR) 150 MG 12 hr tablet Take 1 tablet (150 mg total) by mouth 2 (two) times daily. 60 tablet 2 Taking  . carvedilol (COREG) 12.5 MG tablet Take 1 tablet (12.5 mg total) by mouth 2 (two) times daily. 180 tablet 3 Taking  . cloNIDine (CATAPRES) 0.1 MG tablet Take 1 tablet (0.1 mg total) by mouth 2 (two) times daily. 60 tablet 11 Taking  . isosorbide mononitrate (IMDUR) 60 MG 24 hr tablet Take 1 tablet (60 mg total) by mouth every morning. 30 tablet 11 Taking  . phenazopyridine (PYRIDIUM) 200 MG tablet Take 200 mg by mouth 3 (three) times daily as needed.  Pyridium 200 mg tablet take one by mouth tid prn painful urination dispense 60 one refill called to [facility] [place]. 12/22/2011 at 1135   Taking  . sildenafil (VIAGRA) 25 MG tablet Take 25 mg by mouth daily as needed for erectile dysfunction.     . solifenacin (VESICARE) 5 MG tablet Take 5 mg by mouth daily.     . Tamsulosin HCl (FLOMAX) 0.4 MG CAPS Take 0.4 mg by mouth daily. Flomax 0.51m one tablet by mouth per day; qty 30; 3 refills; [place] [facility] [place]4   Taking  . telmisartan-hydrochlorothiazide (MICARDIS HCT) 80-25 MG per tablet Take 1 tablet by mouth daily. 30 tablet 11 Taking    Results for orders placed or performed during the hospital encounter of 11/20/2015 (from the past 48 hour(s))  I-stat chem 8, ed      Status: Abnormal   Collection Time: 11/07/2015 10:29 PM  Result Value Ref Range   Sodium 136 135 - 145 mmol/L   Potassium 5.0 3.5 - 5.1 mmol/L   Chloride 109 101 - 111 mmol/L   BUN 18 6 - 20 mg/dL   Creatinine, Ser 1.60 (H) 0.61 - 1.24 mg/dL   Glucose, Bld 201 (H) 65 - 99 mg/dL   Calcium, Ion 1.01 (L) 1.13 - 1.30 mmol/L   TCO2 20 0 - 100 mmol/L   Hemoglobin 15.0 13.0 - 17.0 g/dL   HCT 44.0 39.0 - 52.0 %  I-Stat CG4 Lactic Acid, ED     Status: Abnormal   Collection Time: 11/26/2015 10:30 PM  Result Value Ref Range   Lactic Acid, Venous 6.46 (HH) 0.5 - 2.0 mmol/L   Comment NOTIFIED PHYSICIAN   [facility]roponin[facility]     Status: Abnormal   Collection Time: 11/28/2015 10:35 PM  Result Value Ref Range   Troponin i, poc 0.43 (HH) 0.00 - 0.08 ng/mL   Comment NOTIFIED PHYSICIAN    Comment 3            Comment: Due to the release kinetics of cTnI, a negative result within the first hours of the onset of symptoms does not rule out myocardial infarction with certainty. If myocardial infarction is still suspected, repeat the test at appropriate intervals.   I-Stat Chem 8, ED     Status: Abnormal   Collection Time: 11/29/2015 10:37 PM  Result Value Ref Range   Sodium 139 135 - 145 mmol/L   Potassium 5.2 (H) 3.5 - 5.1 mmol/L   Chloride 107 101 - 111 mmol/L   BUN 19 6 - 20 mg/dL   Creatinine, Ser 1.70 (H) 0.61 - 1.24 mg/dL   Glucose, Bld 199 (H) 65 - 99 mg/dL   Calcium, Ion 1.04 (L) 1.13 - 1.30 mmol/L   TCO2 18 0 - 100 mmol/L   Hemoglobin 15.6 13.0 - 17.0 g/dL   HCT 46.0 39.0 - 52.0 %  CBC with Differential     Status: Abnormal   Collection Time: 11/07/2015 10:39 PM  Result Value Ref Range   WBC 20.9 (H) 4.0 - 10.5 K/uL   RBC 4.48 4.22 - 5.81 MIL/uL   Hemoglobin 12.9 (L) 13.0 - 17.0 g/dL   HCT 37.7 (L) 39.0 - 52.0 %   MCV 84.2 78.0 - 100.0 fL   MCH 28.8 26.0 - 34.0 pg   MCHC 34.2 30.0 - 36.0 g/dL   RDW 13.9 11.5 - 15.5 %   Platelets 175  150 - 400 K/uL   Neutrophils Relative %  80 %   Neutro Abs 16.5 (H) 1.7 - 7.7 K/uL   Lymphocytes Relative 17 %   Lymphs Abs 3.7 0.7 - 4.0 K/uL   Monocytes Relative 3 %   Monocytes Absolute 0.7 0.1 - 1.0 K/uL   Eosinophils Relative 0 %   Eosinophils Absolute 0.1 0.0 - 0.7 K/uL   Basophils Relative 0 %   Basophils Absolute 0.0 0.0 - 0.1 K/uL  Comprehensive metabolic panel     Status: Abnormal   Collection Time: 12/01/2015 10:55 PM  Result Value Ref Range   Sodium 142 135 - 145 mmol/L   Potassium 4.1 3.5 - 5.1 mmol/L    Comment: DELTA CHECK NOTED   Chloride 108 101 - 111 mmol/L   CO2 19 (L) 22 - 32 mmol/L   Glucose, Bld 200 (H) 65 - 99 mg/dL   BUN 13 6 - 20 mg/dL   Creatinine, Ser 1.90 (H) 0.61 - 1.24 mg/dL   Calcium 8.3 (L) 8.9 - 10.3 mg/dL   Total Protein 5.4 (L) 6.5 - 8.1 g/dL   Albumin 2.8 (L) 3.5 - 5.0 g/dL   AST 166 (H) 15 - 41 U/L   ALT 52 17 - 63 U/L   Alkaline Phosphatase 109 38 - 126 U/L   Total Bilirubin 0.7 0.3 - 1.2 mg/dL   GFR calc non Af Amer 35 (L) >60 mL/min   GFR calc Af Amer 40 (L) >60 mL/min    Comment: (NOTE) The eGFR has been calculated using the CKD EPI equation. This calculation has not been validated in all clinical situations. eGFR's persistently <60 mL/min signify possible Chronic Kidney Disease.    Anion gap 15 5 - 15  Protime-INR     Status: Abnormal   Collection Time: 11/13/2015 10:55 PM  Result Value Ref Range   Prothrombin Time 15.6 (H) 11.6 - 15.2 seconds   INR 1.22 0.00 - 1.49  I-Stat Arterial Blood Gas, ED - (order at [facility] and [facility] only)     Status: Abnormal   Collection Time: 12/02/2015 11:02 PM  Result Value Ref Range   pH, Arterial 7.060 (LL) 7.350 - 7.450   pCO2 arterial 61.5 (HH) 35.0 - 45.0 mmHg   pO2, Arterial 62.0 (L) 80.0 - 100.0 mmHg   Bicarbonate 17.4 (L) 20.0 - 24.0 mEq/L   TCO2 19 0 - 100 mmol/L   O2 Saturation 79.0 %   Acid-base deficit 13.0 (H) 0.0 - 2.0 mmol/L   Patient temperature 98.6 F    Collection site RADIAL, ALLEN'S TEST ACCEPTABLE    Drawn by RT    Sample  type ARTERIAL    Comment NOTIFIED PHYSICIAN   Brain natriuretic peptide     Status: Abnormal   Collection Time: 11/26/2015 11:16 PM  Result Value Ref Range   B Natriuretic Peptide 1568.2 (H) 0.0 - 100.0 pg/mL  I-Stat arterial blood gas, ED     Status: Abnormal   Collection Time: 11/15/2015 12:34 AM  Result Value Ref Range   pH, Arterial 7.238 (L) 7.350 - 7.450   pCO2 arterial 38.5 35.0 - 45.0 mmHg   pO2, Arterial 61.0 (L) 80.0 - 100.0 mmHg   Bicarbonate 16.8 (L) 20.0 - 24.0 mEq/L   TCO2 18 0 - 100 mmol/L   O2 Saturation 90.0 %   Acid-base deficit 11.0 (H) 0.0 - 2.0 mmol/L   Patient temperature 35.0 C    Collection site RADIAL, ALLEN'S TEST ACCEPTABLE  Drawn by RT    Sample type ARTERIAL   MRSA PCR Screening     Status: None   Collection Time: 11/09/2015 12:54 AM  Result Value Ref Range   MRSA by PCR NEGATIVE NEGATIVE    Comment:        The GeneXpert MRSA Assay (FDA approved for NASAL specimens only), is one component of a comprehensive MRSA colonization surveillance program. It is not intended to diagnose MRSA infection nor to guide or monitor treatment for MRSA infections.   Glucose, capillary     Status: Abnormal   Collection Time: 11/23/2015 12:54 AM  Result Value Ref Range   Glucose-Capillary 157 (H) 65 - 99 mg/dL  Troponin I     Status: Abnormal   Collection Time: 12/05/2015  1:20 AM  Result Value Ref Range   Troponin I 3.64 (HH) <0.031 ng/mL    Comment:        POSSIBLE MYOCARDIAL ISCHEMIA. SERIAL TESTING RECOMMENDED. CRITICAL RESULT CALLED TO, READ BACK BY AND VERIFIED WITH: C.WAFFORD,RN 0207 11/15/2015 M.CAMPBELL   Lactic acid, plasma     Status: Abnormal   Collection Time: 12/01/2015  1:20 AM  Result Value Ref Range   Lactic Acid, Venous 6.5 (HH) 0.5 - 2.0 mmol/L    Comment: CRITICAL RESULT CALLED TO, READ BACK BY AND VERIFIED WITH: F.FLINT,RN 0206 03/02/48M M.CAMPBELL   Procalcitonin - Baseline     Status: None   Collection Time: 11/12/2015  1:20 AM  Result Value  Ref Range   Procalcitonin 1.66 ng/mL    Comment:        Interpretation: PCT > 0.5 ng/mL and <= 2 ng/mL: Systemic infection (sepsis) is possible, but other conditions are known to elevate PCT as well. (NOTE)         ICU PCT Algorithm               Non ICU PCT Algorithm    ----------------------------     ------------------------------         PCT < 0.25 ng/mL                 PCT < 0.1 ng/mL     Stopping of antibiotics            Stopping of antibiotics       strongly encouraged.               strongly encouraged.    ----------------------------     ------------------------------       PCT level decrease by               PCT < 0.25 ng/mL       >= 80% from peak PCT       OR PCT 0.25 - 0.5 ng/mL          Stopping of antibiotics                                             encouraged.     Stopping of antibiotics           encouraged.    ----------------------------     ------------------------------       PCT level decrease by              PCT >= 0.25 ng/mL       < 80% from peak PCT  AND PCT >= 0.5 ng/mL             Continuing antibiotics                                              encouraged.       Continuing antibiotics            encouraged.    ----------------------------     ------------------------------     PCT level increase compared          PCT > 0.5 ng/mL         with peak PCT AND          PCT >= 0.5 ng/mL             Escalation of antibiotics                                          strongly encouraged.      Escalation of antibiotics        strongly encouraged.   TSH     Status: Abnormal   Collection Time: 11/11/2015  1:20 AM  Result Value Ref Range   TSH 6.243 (H) 0.350 - 4.500 uIU/mL  CBC     Status: Abnormal   Collection Time: 11/10/2015  2:45 AM  Result Value Ref Range   WBC 10.6 (H) 4.0 - 10.5 K/uL   RBC 4.89 4.22 - 5.81 MIL/uL   Hemoglobin 13.5 13.0 - 17.0 g/dL   HCT 41.1 39.0 - 52.0 %   MCV 84.0 78.0 - 100.0 fL   MCH 27.6 26.0 - 34.0 pg   MCHC 32.8  30.0 - 36.0 g/dL   RDW 14.0 11.5 - 15.5 %   Platelets 177 150 - 400 K/uL  Basic metabolic panel     Status: Abnormal   Collection Time: 11/15/2015  2:45 AM  Result Value Ref Range   Sodium 143 135 - 145 mmol/L   Potassium 3.9 3.5 - 5.1 mmol/L   Chloride 109 101 - 111 mmol/L   CO2 17 (L) 22 - 32 mmol/L   Glucose, Bld 140 (H) 65 - 99 mg/dL   BUN 14 6 - 20 mg/dL   Creatinine, Ser 1.69 (H) 0.61 - 1.24 mg/dL   Calcium 8.5 (L) 8.9 - 10.3 mg/dL   GFR calc non Af Amer 40 (L) >60 mL/min   GFR calc Af Amer 47 (L) >60 mL/min    Comment: (NOTE) The eGFR has been calculated using the CKD EPI equation. This calculation has not been validated in all clinical situations. eGFR's persistently <60 mL/min signify possible Chronic Kidney Disease.    Anion gap 17 (H) 5 - 15  Magnesium     Status: None   Collection Time: 12/01/2015  2:45 AM  Result Value Ref Range   Magnesium 2.0 1.7 - 2.4 mg/dL  Phosphorus     Status: Abnormal   Collection Time: 11/21/2015  2:45 AM  Result Value Ref Range   Phosphorus 5.3 (H) 2.5 - 4.6 mg/dL  Lactic acid, plasma     Status: Abnormal   Collection Time: 11/27/2015  2:45 AM  Result Value Ref Range   Lactic Acid, Venous 7.5 (HH) 0.5 - 2.0 mmol/L    Comment: CRITICAL RESULT CALLED TO,  READ BACK BY AND VERIFIED WITH: HOPPER M,RN 11/16/2015 0333 WAYK   Troponin I (q 6hr x 3)     Status: Abnormal   Collection Time: 11/20/2015  2:45 AM  Result Value Ref Range   Troponin I 10.60 (HH) <0.031 ng/mL    Comment:        POSSIBLE MYOCARDIAL ISCHEMIA. SERIAL TESTING RECOMMENDED. CRITICAL VALUE NOTED.  VALUE IS CONSISTENT WITH PREVIOUSLY REPORTED AND CALLED VALUE.   Glucose, capillary     Status: Abnormal   Collection Time: 11/30/2015  3:25 AM  Result Value Ref Range   Glucose-Capillary 125 (H) 65 - 99 mg/dL  Influenza panel by PCR (type A & B, H1N1)     Status: None   Collection Time: 11/30/2015  3:39 AM  Result Value Ref Range   Influenza A By PCR NEGATIVE NEGATIVE   Influenza B  By PCR NEGATIVE NEGATIVE   H1N1 flu by pcr NOT DETECTED NOT DETECTED    Comment:        The Xpert Flu assay (FDA approved for nasal aspirates or washes and nasopharyngeal swab specimens), is intended as an aid in the diagnosis of influenza and should not be used as a sole basis for treatment.   Urinalysis, Routine w reflex microscopic (not at [facility])     Status: Abnormal   Collection Time: 11/08/2015  3:53 AM  Result Value Ref Range   Color, Urine YELLOW YELLOW   APPearance CLOUDY (A) CLEAR   Specific Gravity, Urine 1.010 1.005 - 1.030   pH 5.0 5.0 - 8.0   Glucose, UA NEGATIVE NEGATIVE mg/dL   Hgb urine dipstick LARGE (A) NEGATIVE   Bilirubin Urine NEGATIVE NEGATIVE   Ketones, ur NEGATIVE NEGATIVE mg/dL   Protein, ur 30 (A) NEGATIVE mg/dL   Nitrite NEGATIVE NEGATIVE   Leukocytes, UA NEGATIVE NEGATIVE  Urine microscopic-add on     Status: Abnormal   Collection Time: 11/23/2015  3:53 AM  Result Value Ref Range   Squamous Epithelial / LPF 0-5 (A) NONE SEEN   WBC, UA NONE SEEN 0 - 5 WBC/hpf   RBC / HPF 0-5 0 - 5 RBC/hpf   Bacteria, UA FEW (A) NONE SEEN   Casts GRANULAR CAST (A) NEGATIVE  Urine rapid drug screen (hosp performed)     Status: None   Collection Time: 11/13/2015  3:54 AM  Result Value Ref Range   Opiates NONE DETECTED NONE DETECTED   Cocaine NONE DETECTED NONE DETECTED   Benzodiazepines NONE DETECTED NONE DETECTED   Amphetamines NONE DETECTED NONE DETECTED   Tetrahydrocannabinol NONE DETECTED NONE DETECTED   Barbiturates NONE DETECTED NONE DETECTED    Comment:        DRUG SCREEN FOR MEDICAL PURPOSES ONLY.  IF CONFIRMATION IS NEEDED FOR ANY PURPOSE, NOTIFY LAB WITHIN 5 DAYS.        LOWEST DETECTABLE LIMITS FOR URINE DRUG SCREEN Drug Class       Cutoff (ng/mL) Amphetamine      1000 Barbiturate      200 Benzodiazepine   277 Tricyclics       824 Opiates          300 Cocaine          300 THC              50   Strep pneumoniae urinary antigen     Status: Abnormal    Collection Time: 11/29/2015  3:57 AM  Result Value Ref Range   Strep Pneumo Urinary Antigen  POSITIVE (A) NEGATIVE  I-STAT 3, arterial blood gas (G3+)     Status: Abnormal   Collection Time: 11/18/2015  4:20 AM  Result Value Ref Range   pH, Arterial 7.188 (LL) 7.350 - 7.450   pCO2 arterial 46.2 (H) 35.0 - 45.0 mmHg   pO2, Arterial 94.0 80.0 - 100.0 mmHg   Bicarbonate 17.5 (L) 20.0 - 24.0 mEq/L   TCO2 19 0 - 100 mmol/L   O2 Saturation 95.0 %   Acid-base deficit 11.0 (H) 0.0 - 2.0 mmol/L   Patient temperature 98.7 F    Collection site RADIAL, ALLEN'S TEST ACCEPTABLE    Drawn by RT    Sample type ARTERIAL    Comment NOTIFIED PHYSICIAN   Troponin I     Status: Abnormal   Collection Time: 11/15/2015  6:57 AM  Result Value Ref Range   Troponin I 31.57 (HH) <0.031 ng/mL    Comment:        POSSIBLE MYOCARDIAL ISCHEMIA. SERIAL TESTING RECOMMENDED. CRITICAL VALUE NOTED.  VALUE IS CONSISTENT WITH PREVIOUSLY REPORTED AND CALLED VALUE.   CBC     Status: Abnormal   Collection Time: 11/10/2015  6:57 AM  Result Value Ref Range   WBC 7.5 4.0 - 10.5 K/uL   RBC 4.18 (L) 4.22 - 5.81 MIL/uL   Hemoglobin 12.1 (L) 13.0 - 17.0 g/dL   HCT 35.1 (L) 39.0 - 52.0 %   MCV 84.0 78.0 - 100.0 fL   MCH 28.9 26.0 - 34.0 pg   MCHC 34.5 30.0 - 36.0 g/dL   RDW 14.3 11.5 - 15.5 %   Platelets 134 (L) 150 - 400 K/uL  Basic metabolic panel     Status: Abnormal   Collection Time: 11/11/2015  6:57 AM  Result Value Ref Range   Sodium 144 135 - 145 mmol/L   Potassium 4.3 3.5 - 5.1 mmol/L   Chloride 111 101 - 111 mmol/L   CO2 19 (L) 22 - 32 mmol/L   Glucose, Bld 50 (L) 65 - 99 mg/dL   BUN 17 6 - 20 mg/dL   Creatinine, Ser 2.11 (H) 0.61 - 1.24 mg/dL   Calcium 10.1 8.9 - 10.3 mg/dL   GFR calc non Af Amer 31 (L) >60 mL/min   GFR calc Af Amer 36 (L) >60 mL/min    Comment: (NOTE) The eGFR has been calculated using the CKD EPI equation. This calculation has not been validated in all clinical situations. eGFR's  persistently <60 mL/min signify possible Chronic Kidney Disease.    Anion gap 14 5 - 15  I-STAT 3, arterial blood gas (G3+)     Status: Abnormal   Collection Time: 11/08/2015  6:57 AM  Result Value Ref Range   pH, Arterial 7.115 (LL) 7.350 - 7.450   pCO2 arterial 66.5 (HH) 35.0 - 45.0 mmHg   pO2, Arterial 63.0 (L) 80.0 - 100.0 mmHg   Bicarbonate 21.3 20.0 - 24.0 mEq/L   TCO2 23 0 - 100 mmol/L   O2 Saturation 82.0 %   Acid-base deficit 9.0 (H) 0.0 - 2.0 mmol/L   Patient temperature 98.7 F    Collection site ARTERIAL LINE    Drawn by RT    Sample type ARTERIAL    Comment NOTIFIED PHYSICIAN   Troponin I (q 6hr x 3)     Status: Abnormal   Collection Time: 11/08/2015  9:58 AM  Result Value Ref Range   Troponin I >65.00 (HH) <0.031 ng/mL    Comment: CORRECTED RESULTS CALLED TO:  CRITICAL RESULT CALLED TO, READ BACK BY AND VERIFIED WITH: HOLLY CHURCH,RN AT 7741 11/22/2015 BY ZBEECH. CORRECTED ON 03/02 AT 1248: PREVIOUSLY REPORTED AS 65.00 CORRECTED RESULTS CALLED TO: CRITICAL RESULT CALLED TO, READ BACK BY AND VERIFIED WITH: HOLLY CHURCH,RN AT 2878 11/24/2015 BY ZBEECH., CORRECTED ON 03/02 AT 1243: PREVIOUSLY REPORTED AS 67.10         POSSIBLE MYOCARDIAL ISCHEMIA. SERIAL TESTING RECOMMENDED. CRITICAL RESULT CALLED TO, READ BACK BY AND VERIFIED WITH: HOLLY CHURCH,RN AT 6767 11/07/2015 BY ZBEECH.   Glucose, capillary     Status: None   Collection Time: 11/25/2015  9:58 AM  Result Value Ref Range   Glucose-Capillary 65 65 - 99 mg/dL   Comment 1 Arterial Specimen    Comment 2 Notify RN   Glucose, capillary     Status: Abnormal   Collection Time: 11/21/2015 10:34 AM  Result Value Ref Range   Glucose-Capillary 151 (H) 65 - 99 mg/dL  I-STAT 3, arterial blood gas (G3+)     Status: Abnormal   Collection Time: 11/15/2015 11:24 AM  Result Value Ref Range   pH, Arterial 7.461 (H) 7.350 - 7.450   pCO2 arterial 30.7 (L) 35.0 - 45.0 mmHg   pO2, Arterial 96.0 80.0 - 100.0 mmHg   Bicarbonate 22.0 20.0 - 24.0 mEq/L    TCO2 23 0 - 100 mmol/L   O2 Saturation 98.0 %   Acid-base deficit 1.0 0.0 - 2.0 mmol/L   Patient temperature 36.4 C    Sample type ARTERIAL   Glucose, capillary     Status: Abnormal   Collection Time: 11/07/2015 11:48 AM  Result Value Ref Range   Glucose-Capillary 153 (H) 65 - 99 mg/dL   Comment 1 Arterial Specimen   Troponin I     Status: Abnormal   Collection Time: 11/11/2015 11:50 AM  Result Value Ref Range   Troponin I >65.00 (HH) <0.031 ng/mL    Comment: CRITICAL VALUE NOTED.  VALUE IS CONSISTENT WITH PREVIOUSLY REPORTED AND CALLED VALUE.        POSSIBLE MYOCARDIAL ISCHEMIA. SERIAL TESTING RECOMMENDED.     Dg Chest Port 1 View  11/15/2015  CLINICAL DATA:  Central line placement EXAM: PORTABLE CHEST 1 VIEW COMPARISON:  Chest x-ray from earlier same day and from 11/17/2015. FINDINGS: Heart size is stable. Overall cardiomediastinal silhouette is stable in size and configuration. Endotracheal tube remains well positioned with tip approximately 3 cm above the level of the carina. Nasogastric tube passes below the diaphragm. New left IJ central line in place with tip adequately positioned over the mid SVC. No pneumothorax seen. Diffuse airspace opacities are again noted throughout the right lung. Again noted is a probable small right pleural effusion, unchanged. No new lung findings. IMPRESSION: 1. New left IJ central line appears adequately positioned with tip projected over the mid SVC. No pneumothorax. 2. Otherwise stable chest x-ray. Diffuse airspace opacities throughout the right lung, most prominent at the right lung base, unchanged in the short-term interval, most suggestive of pneumonia versus asymmetric pulmonary edema. Probable small right pleural effusion. Left lung remains clear. Electronically Signed   By: Franki Cabot M.D.   On: 11/09/2015 08:21   Dg Chest Port 1 View  11/15/2015  CLINICAL DATA:  Respiratory failure. EXAM: PORTABLE CHEST 1 VIEW COMPARISON:  11/28/2015. FINDINGS:  Endotracheal tube, NG tube in stable position. Heart size stable. Stable cardiomegaly. Diffuse right lung infiltrate, all slight interim improvement. Mild atelectasis and/or infiltrate left lung base. Small right pleural effusion cannot be  excluded. No pneumothorax . Previously identified tiny metallic density noted along the right lateral chest well no longer identified. IMPRESSION: 1. Lines and tubes in stable position. 2. Diffuse right lung infiltrate, slight interim improvement. Mild left lower lobe atelectasis and/or infiltrate. Small right pleural effusion cannot be excluded. Electronically Signed   By: Marcello Moores  Register   On: 11/30/2015 07:16   Dg Chest Portable 1 View  11/08/2015  CLINICAL DATA:  Intubation. Respiratory distress. History of hypertension, prostate cancer. EXAM: PORTABLE CHEST 1 VIEW COMPARISON:  Chest radiograph September 08, 2011 FINDINGS: Endotracheal tube tip projects 2.3 cm above the carina. Nasogastric tube past the proximal stomach, distal tip not imaged. Multiple pacer pads and wires overlie the chest. The cardiac silhouette appears moderately enlarged, mediastinal silhouette is nonsuspicious. Diffuse interstitial prominence, RIGHT greater than LEFT with pulmonary vascular congestion. No pleural effusions though LEFT costophrenic angle is not imaged. No pneumothorax. Small metallic linear density projecting at RIGHT chest wall may be the external to the patient or, represent needle fragment. Recommend direct inspection. IMPRESSION: Endotracheal tube tip projects 2.3 cm above the carina. Nasogastric tube past proximal stomach. Increasing cardiomegaly and findings of asymmetric interstitial pulmonary edema. Electronically Signed   By: Elon Alas M.D.   On: 12/03/2015 22:59   Dg Abd Portable 1v  11/24/2015  CLINICAL DATA:  70 year old male status post intubation and enteric tube placement. Respiratory distress. EXAM: PORTABLE ABDOMEN - 1 VIEW COMPARISON:  None. FINDINGS: An  endotracheal tube is partially visualized extending into the left hemi abdomen with tip positioned in the left lower abdomen overlying the iliac crest. Normal caliber air-filled loops of small bowel noted. There is cardiomegaly. There is diffuse hazy density of the right lung base. A 2 cm linear radiopaque density over the right lung base may be superimposed on the patient. Clinical correlation is recommended. IMPRESSION: Enteric tube the tip in the left lower abdomen superimposed over the iliac crest. Electronically Signed   By: Anner Crete M.D.   On: 11/10/2015 23:02    ROS Blood pressure 205/60, pulse 69, temperature 98.1 F (36.7 C), temperature source Rectal, resp. rate 22, height _0  (1.753 m), weight 48.7 kg (107 lb 5.8 oz), SpO2 100 %. Physical Exam Physical Examination: General appearance - entub, poorly responsive Mental status - responsive only to pain, moves eyes but not coherent Eyes - pupils equal and reactive, extraocular eye movements intact Mouth - mucous membranes moist, pharynx normal without lesions Neck - adenopathy noted PCL Lymphatics - posterior cervical nodes Chest - rhonchi bilat, R>L,  Rales inR base Heart - 75, many prematures, balloon audible Abdomen - has bs but decreased, soft, liver down 6 cm, large L IH Neurological - as above, sym facies, moves all extrem Extremities - very cold, Tr DP Skin - normal coloration and turgor, no rashes, no suspicious skin lesions noted  Assessment/Plan: 1 AKI presumeably ischemic ATN. Has some CKD.  Now nonoliguric and will see how does with that with solute, acid/base/K.  If worsening start CRRT.  Urine mild protein no active sediment 2 CKD 3 3 Hypertension: not an issue 4. CM with low EF 5. S/p multilple arrests follow MS and clinical parameter 6 Pulm infilt  ?Flu vs other. On AB, antivirals 7 Prostate Ca 8ETOH abuse 10Enceph P U Na, Cr, U/s,  Follow chem, urine vol, limit vol, poss CRRt  Mychele Seyller  L 11/26/2015, 1:17 PM

## 2015-11-06 NOTE — Progress Notes (Signed)
  Patient Name: William Mccullough   MRN: [id number]   Date of Birth/ Sex: 1946/06/22 , male      Admission Date: 11/14/2015  Attending Provider: Marshell Garfinkel, MD  Primary Diagnosis: <principal problem not specified>   Indication: Pt was in his usual state of health until this AM, when he was noted to be PEA arrest. Code blue was subsequently called. At the time of arrival on scene, ACLS protocol was underway.   Technical Description:  - CPR performance duration:  9  minutes  - Was defibrillation or cardioversion used? Yes   - Was external pacer placed? No  - Was patient intubated pre/post CPR? Yes, patient already intubated prior to code   Medications Administered: Y = Yes; Blank = No Amiodarone    Atropine    Calcium  Y  Epinephrine  Y  Lidocaine    Magnesium    Norepinephrine    Phenylephrine    Sodium bicarbonate  Y  Vasopressin     Post CPR evaluation:  - Final Status - Was patient successfully resuscitated ? Yes - What is current rhythm? Sinus tachyardic - What is current hemodynamic status? BP 220/110. Patient with gross blood in NG tube. Heparin drip stopped.   Miscellaneous Information:  - Labs sent, including: CBC, BMP, ABG, CXR, EKG  - Primary team notified?  Yes  - Family Notified? No, no contact info for family available   - Additional notes/ transfer status: Patient found to be in PEA arrest. Received 4 amps of epi and 2 debrillations. Went into pulseless VTach and eventually has ROSC.      Maryellen Pile, MD  11/11/2015, 6:42 AM

## 2015-11-06 NOTE — Progress Notes (Signed)
ANTICOAGULATION CONSULT NOTE  Pharmacy Consult for heparin Indication: chest pain/ACS; s/p IABP placement  No Known Allergies  Patient Measurements: Height: 5\' 9"  (175.3 cm) Weight: 107 lb 5.8 oz (48.7 kg) IBW/kg (Calculated) : 70.7  Vital Signs: Temp: 100 F (37.8 C) (03/02 1900) Temp Source: Core (Comment) (03/02 1600) BP: 180/51 mmHg (03/02 1900) Pulse Rate: 76 (03/02 1900)  Labs:  Recent Labs  11/28/2015 2239 11/17/2015 2255  11/15/2015 0245 12/05/2015 0657 11/14/2015 0958 11/14/2015 1150 12/05/2015 1437 11/18/2015 1832 11/14/2015 1833  HGB 12.9*  --   --  13.5 12.1*  --   --   --   --   --   HCT 37.7*  --   --  41.1 35.1*  --   --   --   --   --   PLT 175  --   --  177 134*  --   --   --   --   --   LABPROT  --  15.6*  --   --   --   --   --   --   --   --   INR  --  1.22  --   --   --   --   --   --   --   --   HEPARINUNFRC  --   --   --   --   --   --   --   --  0.25*  --   CREATININE  --  1.90*  --  1.69* 2.11*  --   --  2.41*  --  2.61*  TROPONINI  --   --   < > 10.60* 31.57* >65.00* >65.00*  --   --   --   < > = values in this interval not displayed.  Estimated Creatinine Clearance: 18.9 mL/min (by C-G formula based on Cr of 2.61).   Medical History: Past Medical History  Diagnosis Date  . Hypertension   . Heart murmur     pt told he has heart murmur-states it does not cause him any problems  . Cough     09/08/11  NEW ONSET OF COUGH--STARTED 09/04/11 - NO FEVER--PT THINKS HE MAY BE GETTING A COLD  . Cold     Head congestion, non productive cough, afebrile  . H/O acute pancreatitis   . Prostate cancer ([place])   . Cardiomyopathy   . Elevated PSA   . Anxiety   . Depression   . Undiagnosed cardiac murmurs     functional murmur  . History of radiation therapy 11/24/11 - 01/24/12    prostate, seminal vesicles    Medications:  Prescriptions prior to admission  Medication Sig Dispense Refill Last Dose  . amLODipine (NORVASC) 10 MG tablet Take 1 tablet (10 mg total) by  mouth daily. 30 tablet 11 Taking  . aspirin 81 MG tablet Take 81 mg by mouth daily.     Marland Kitchen atorvastatin (LIPITOR) 20 MG tablet Take 1 tablet (20 mg total) by mouth daily. 30 tablet 11 Taking  . buPROPion (WELLBUTRIN SR) 150 MG 12 hr tablet Take 1 tablet (150 mg total) by mouth 2 (two) times daily. 60 tablet 2 Taking  . carvedilol (COREG) 12.5 MG tablet Take 1 tablet (12.5 mg total) by mouth 2 (two) times daily. 180 tablet 3 Taking  . cloNIDine (CATAPRES) 0.1 MG tablet Take 1 tablet (0.1 mg total) by mouth 2 (two) times daily. 60 tablet 11 Taking  . isosorbide  mononitrate (IMDUR) 60 MG 24 hr tablet Take 1 tablet (60 mg total) by mouth every morning. 30 tablet 11 Taking  . phenazopyridine (PYRIDIUM) 200 MG tablet Take 200 mg by mouth 3 (three) times daily as needed. Pyridium 200 mg tablet take one by mouth tid prn painful urination dispense 60 one refill called to [facility] at [place]. 12/22/2011 at 1135   Taking  . sildenafil (VIAGRA) 25 MG tablet Take 25 mg by mouth daily as needed for erectile dysfunction.     . solifenacin (VESICARE) 5 MG tablet Take 5 mg by mouth daily.     . Tamsulosin HCl (FLOMAX) 0.4 MG CAPS Take 0.4 mg by mouth daily. Flomax 0.4mg  one tablet by mouth per day; qty 30; 3 refills; called into [place] at [place]. 12/22/11 at 1134   Taking  . telmisartan-hydrochlorothiazide (MICARDIS HCT) 80-25 MG per tablet Take 1 tablet by mouth daily. 30 tablet 11 Taking   Scheduled:   Infusions:  . sodium chloride 5 mL/hr at 11/21/2015 1227  . fentaNYL infusion INTRAVENOUS 50 mcg/hr (11/21/2015 1036)  . heparin 550 Units/hr (12/01/2015 0951)  . norepinephrine (LEVOPHED) Adult infusion 5 mcg/min (11/11/2015 1750)  .  sodium bicarbonate  infusion 1000 mL 150 mL/hr at 11/20/2015 1504  . vasopressin (PITRESSIN) infusion - *FOR SHOCK* 0.03 Units/min (12/02/2015 [id number])    Assessment: 70yo male found unresponsive by friends and foaming at the mouth,  intubated and started on ABX for sepsis and possible PNA, now w/ elevated troponin, started on IV heparin.  Heparin held during code overnight due to blood in ET tube, then resumed.  Held again on call to cath lab this AM. Patient now has IABP in place, and pharmacy asked to start IV heparin upon arrival to [facility].  Initial heparin level after restart is within 0.2-0.5 goal on 550 units/hr. Continue current rate and check a confirmatory level in 8 hours  Goal of Therapy:  Heparin level 0.2-0.5 while on IABP Monitor platelets by anticoagulation protocol: Yes   Plan:  1. Continue heparin gtt at 550 units/hr. 2. Check confirmatory heparin level in 8 hrs. 3. Daily heparin level and CBC.  Thank you for allowing [place] to participate in this patients care. Jens Som, PharmD Pager: [phone]  11/23/2015 7:13 PM

## 2015-11-06 NOTE — Progress Notes (Signed)
[facility] Progress Note Patient Name: William Mccullough DOB: 02/04/1946 MRN: [id number]   Date of Service  11/25/2015  HPI/Events of Note  Low bp  eICU Interventions  Fluid bolus, if not responding may need pressors.      Intervention Category Major Interventions: Hypotension - evaluation and management  Laverle Hobby 11/15/2015, 6:06 AM

## 2015-11-06 NOTE — Interval H&P Note (Signed)
History and Physical Interval Note:  11/07/2015 8:39 AM  William Mccullough  has presented today for surgery, with the diagnosis of hf  The various methods of treatment have been discussed with the patient and family. After consideration of risks, benefits and other options for treatment, the patient has consented to  Procedure(s): IABP Insertion (N/A) as a surgical intervention .  The patient's history has been reviewed, patient examined, no change in status, stable for surgery.  I have reviewed the patient's chart and labs.  Questions were answered to the patient's satisfaction.     Sinclair Grooms

## 2015-11-06 NOTE — Progress Notes (Signed)
eLink Physician-Brief Progress Note Patient Name: JOHNNELL MICHEAU DOB: 1946-04-29 MRN: [id number]   Date of Service  11/27/2015  HPI/Events of Note    eICU Interventions  Labs reviewed, decreased Na bicarb from 150/h to 50cc/h. Follow UOP, BMP     Intervention Category Major Interventions: Acid-Base disturbance - evaluation and management  BYRUM,ROBERT S. 11/11/2015, 7:50 PM

## 2015-11-06 NOTE — Consult Note (Addendum)
CARDIOLOGY CONSULT NOTE  Patient ID: William Mccullough MRN: [id number] DOB/AGE: Jan 13, 1946 70 y.o.  Admit date: 11/13/2015 Consulting physician: Dr. Vaughan Browner Reason for Consultation: NSTEMI  HPI: 70 yo with history of prior prostate cancer treated with radiation, known CAD, and active smoking was found by friends tonight to be in respiratory distress.  EMS called, he had pink frothy sputum and oxygen saturation 40%. He was put on Bipap and brought to ER.  In ER, still in respiratory distress so was intubated.  Lost pulse just after intubation (PEA) and had 1-2 minutes CPR with epinephrine/bicarbonate.  He had ROSC with WCT at rate as high at 180s.  Possible SVT with aberrancy.  HR then slowed to obvious NSR with IVCD.  BP dropped and norepinephrine was begun.  Subsequently, he was weaned off norepinephrine.  CXR with suspected asymmetric pulmonary edema.  He was started on IV heparin.   Bedside echo in ER per report showed EF around 15%.  Troponin is up to 10.6.  Urinary Strep pneumo antigen positive.   Since arriving on 36M, his BP trended down, and when I arrived, he was in the process of another PEA arrest.  He got epinephrine and went into VT that was shocked.  He had CPR again for another 9 minutes before ROSC.  He had blood come up from ET tube so heparin was stopped.   Review of systems complete and found to be negative unless listed above in HPI  PMH: 1. HTN 2. H/o ETOH abuse 3. H/o acute pancreatitis 4. Prostate cancer diagnosed by biopsy: Treated in 2013 with radiation.  5. Active smoker 6. Ischemic cardiomyopathy: Echo (1/13) with EF 40-45%, diffuse hypokinesis, mild LVH, mild MR.  7. CAD: ETT-myoview (1/13): patient only able to exercise 2:04 due to fatigue and dyspnea, also developed LBBB, switched to [place] and developed chest pain with LBBB again; EF 52% with no perfusion defect. LHC (1/13) with 90% mid ramus (small vessel), 80% small OM1, 50% mCFX, 50% mRCA, 70% dRCA, small  PDA with diffuse up to 80% stenosis. Plan for medical management (no good interventional target).    Family History  Problem Relation Age of Onset  . Diabetes type II Sister   . Hypertension Sister     Social History   Social History  . Marital Status: Legally Separated    Spouse Name: N/A  . Number of Children: N/A  . Years of Education: N/A   Occupational History  . Retired    Social History Main Topics  . Smoking status: Current Every Day Smoker -- 1.00 packs/day for 40 years    Types: Cigarettes  . Smokeless tobacco: Never Used     Comment: 01/10/12 states < 1 ppd  . Alcohol Use: 7.0 oz/week    14 drink(s) per week     Comment: 1 weekend per month//hx of ETOH abuse  . Drug Use: No  . Sexual Activity: Not on file   Other Topics Concern  . Not on file   Social History Narrative   Lives in [place], [place]. Drinks 6 servings of caffeine daily.      Prescriptions prior to admission  Medication Sig Dispense Refill Last Dose  . amLODipine (NORVASC) 10 MG tablet Take 1 tablet (10 mg total) by mouth daily. 30 tablet 11 Taking  . aspirin 81 MG tablet Take 81 mg by mouth daily.     Marland Kitchen atorvastatin (LIPITOR) 20 MG tablet Take 1 tablet (20 mg total) by mouth daily.  30 tablet 11 Taking  . buPROPion (WELLBUTRIN SR) 150 MG 12 hr tablet Take 1 tablet (150 mg total) by mouth 2 (two) times daily. 60 tablet 2 Taking  . carvedilol (COREG) 12.5 MG tablet Take 1 tablet (12.5 mg total) by mouth 2 (two) times daily. 180 tablet 3 Taking  . cloNIDine (CATAPRES) 0.1 MG tablet Take 1 tablet (0.1 mg total) by mouth 2 (two) times daily. 60 tablet 11 Taking  . isosorbide mononitrate (IMDUR) 60 MG 24 hr tablet Take 1 tablet (60 mg total) by mouth every morning. 30 tablet 11 Taking  . phenazopyridine (PYRIDIUM) 200 MG tablet Take 200 mg by mouth 3 (three) times daily as needed. Pyridium 200 mg tablet take one by mouth tid prn painful urination dispense 60 one refill called to [facility] at [place]. 12/22/2011 at 1135   Taking  . sildenafil (VIAGRA) 25 MG tablet Take 25 mg by mouth daily as needed for erectile dysfunction.     . solifenacin (VESICARE) 5 MG tablet Take 5 mg by mouth daily.     . Tamsulosin HCl (FLOMAX) 0.4 MG CAPS Take 0.4 mg by mouth daily. Flomax 0.4mg  one tablet by mouth per day; qty 30; 3 refills; called into [place] at [place]. 12/22/11 at 1134   Taking  . telmisartan-hydrochlorothiazide (MICARDIS HCT) 80-25 MG per tablet Take 1 tablet by mouth daily. 30 tablet 11 Taking   Current Scheduled Meds: . antiseptic oral rinse  7 mL Mouth Rinse QID  . aspirin  81 mg Per Tube Daily  . atorvastatin  20 mg Per Tube q1800  . azithromycin  500 mg Intravenous Q24H  . cefTRIAXone (ROCEPHIN)  IV  1 g Intravenous Q24H  . chlorhexidine gluconate  15 mL Mouth Rinse BID  . insulin aspart  0-15 Units Subcutaneous 6 times per day  . pantoprazole (PROTONIX) IV  40 mg Intravenous QHS  . sodium chloride  1,000 mL Intravenous Once  . sodium chloride  1,000 mL Intravenous Once   Continuous Infusions: . sodium chloride 100 mL/hr (11/23/2015 0256)  . fentaNYL infusion INTRAVENOUS 50 mcg/hr (11/15/2015 0105)  . heparin 550 Units/hr (11/26/2015 0326)   PRN Meds:.sodium chloride, albuterol, fentaNYL, midazolam, midazolam  Physical exam Blood pressure 89/70, pulse 80, temperature 97.5 F (36.4 C), temperature source Rectal, resp. rate 23, height 5\' 9"  (1.753 m), weight 107 lb 5.8 oz (48.7 kg), SpO2 100 %. General: Sedated/intubated.  Neck: JVP to angle of jaw, no thyromegaly or thyroid nodule.  Lungs: Crackles 1/2 up bilaterally.  CV: Lateral PMI.  Heart tachy, regular S1/S2, difficult to hear heart sounds above vent.  No peripheral edema.  No carotid bruit.  Abdomen: Soft, nontender, no hepatosplenomegaly, no distention.  Skin: Intact without lesions or rashes.  Neurologic: Sedated.   Extremities: No clubbing or cyanosis.  HEENT: Normal.    Labs:   Lab Results  Component Value Date   WBC 10.6* 11/26/2015   HGB 13.5 11/27/2015   HCT 41.1 11/14/2015   MCV 84.0 11/28/2015   PLT 177 11/05/2015    Recent Labs Lab 12/02/2015 2255 11/14/2015 0245  NA 142 143  K 4.1 3.9  CL 108 109  CO2 19* 17*  BUN 13 14  CREATININE 1.90* 1.69*  CALCIUM 8.3* 8.5*  PROT 5.4*  --   BILITOT 0.7  --   ALKPHOS 109  --   ALT 52  --   AST 166*  --   GLUCOSE 200* 140*  Lab Results  Component Value Date   CKTOTAL 59 10/23/2010   TROPONINI 10.60* 11/08/2015  UDS: Negative BNP 1568 PCT 1.66 Strep pneumo urine antigen positive Flu negative  Radiology: - CXR: Asymmetric edema  EKG:  1. WCT at 188, possible SVT with aberrancy 2. NSR, IVCD  ASSESSMENT AND PLAN: 70 yo with history of prior prostate cancer treated with radiation, known CAD, and active smoking was found by friends tonight to be in respiratory distress.  To ER, intubated, NSTEMI with troponin to 10.  He has now had CPR for PEA x 2. 1. Hypoxemic respiratory failure: On CXR, appears to have asymmetric pulmonary edema and volume overloaded by exam.  Suspect acute (?chronic) systolic CHF.  EF 15% on bedside echo.  Strep pneumo infection may also contribute (urine antigen positive).  - Support BP with norepinephrine as needed.  - Place CVL, follow co-ox and CVP.  - Will need diuresis.   - broad spectrum abx.  2. CAD: Suspect NSTEMI with troponin to 10.  Has IVCD on ECG, last cardiology evaluation back in 2013 makes note of a rate-related IVD.  Suspect that this is not new.  Now that he is s/p PEA arrest x 2 and has extensive pulmonary edema, think he needs stabilization prior to cardiac cath.  - Heparin off due to hemoptysis.  - Continue ASA 81, statin.  - Cardiac cath when more stable and diuresed.  3. Acute (?chronic) systolic CHF: EF 0000000 per bedside echo in ER.  Currently hypertensive post-epinephrine with code.  - Support BP with norepinephrine as needed . - CVL for  co-ox/CVP.  - Diurese as able. - formal echo today.  4. ID: Strep pneumo urine antigen positive, cover with broad spectrum abx.  5. Active smoker.   Loralie Champagne 11/08/2015 6:48 AM  BP dropped precipitously post 2nd PEA arrest. Now back on norepinephrine and starting vasopressin.  I think he will need IABP support, will arrange this morning.   Loralie Champagne 11/24/2015 7:57 AM

## 2015-11-06 NOTE — Progress Notes (Addendum)
ANTICOAGULATION CONSULT NOTE - Initial Consult  Pharmacy Consult for heparin Indication: chest pain/ACS  No Known Allergies  Patient Measurements: Height: 5\' 9"  (175.3 cm) Weight: 107 lb 5.8 oz (48.7 kg) IBW/kg (Calculated) : 70.7  Vital Signs: Temp: 94.5 F (34.7 C) (03/02 0100) Temp Source: Rectal (03/01 2312) BP: 155/112 mmHg (03/02 0100) Pulse Rate: 94 (03/02 0100)  Labs:  Recent Labs  11/29/2015 2229 11/18/2015 2237 11/24/2015 2239 11/21/2015 2255 11/06/15 0120  HGB 15.0 15.6 12.9*  --   --   HCT 44.0 46.0 37.7*  --   --   PLT  --   --  175  --   --   LABPROT  --   --   --  15.6*  --   INR  --   --   --  1.22  --   CREATININE 1.60* 1.70*  --  1.90*  --   TROPONINI  --   --   --   --  3.64*    Estimated Creatinine Clearance: 26 mL/min (by C-G formula based on Cr of 1.9).   Medical History: Past Medical History  Diagnosis Date  . Hypertension   . Heart murmur     pt told he has heart murmur-states it does not cause him any problems  . Cough     09/08/11  NEW ONSET OF COUGH--STARTED 09/04/11 - NO FEVER--PT THINKS HE MAY BE GETTING A COLD  . Cold     Head congestion, non productive cough, afebrile  . H/O acute pancreatitis   . Prostate cancer   . Cardiomyopathy   . Elevated PSA   . Anxiety   . Depression   . Undiagnosed cardiac murmurs     functional murmur  . History of radiation therapy 11/24/11 - 01/24/12    prostate, seminal vesicles    Medications:  Prescriptions prior to admission  Medication Sig Dispense Refill Last Dose  . amLODipine (NORVASC) 10 MG tablet Take 1 tablet (10 mg total) by mouth daily. 30 tablet 11 Taking  . aspirin 81 MG tablet Take 81 mg by mouth daily.     Marland Kitchen atorvastatin (LIPITOR) 20 MG tablet Take 1 tablet (20 mg total) by mouth daily. 30 tablet 11 Taking  . buPROPion (WELLBUTRIN SR) 150 MG 12 hr tablet Take 1 tablet (150 mg total) by mouth 2 (two) times daily. 60 tablet 2 Taking  . carvedilol (COREG) 12.5 MG tablet Take 1 tablet  (12.5 mg total) by mouth 2 (two) times daily. 180 tablet 3 Taking  . cloNIDine (CATAPRES) 0.1 MG tablet Take 1 tablet (0.1 mg total) by mouth 2 (two) times daily. 60 tablet 11 Taking  . isosorbide mononitrate (IMDUR) 60 MG 24 hr tablet Take 1 tablet (60 mg total) by mouth every morning. 30 tablet 11 Taking  . phenazopyridine (PYRIDIUM) 200 MG tablet Take 200 mg by mouth 3 (three) times daily as needed. Pyridium 200 mg tablet take one by mouth tid prn painful urination dispense 60 one refill called to [facility] at [place]. 12/22/2011 at 1135   Taking  . sildenafil (VIAGRA) 25 MG tablet Take 25 mg by mouth daily as needed for erectile dysfunction.     . solifenacin (VESICARE) 5 MG tablet Take 5 mg by mouth daily.     . Tamsulosin HCl (FLOMAX) 0.4 MG CAPS Take 0.4 mg by mouth daily. Flomax 0.4mg  one tablet by mouth per day; qty 30; 3 refills; called into [place] at [facility].  Vivien Presto. 12/22/11 at 1134   Taking  . telmisartan-hydrochlorothiazide (MICARDIS HCT) 80-25 MG per tablet Take 1 tablet by mouth daily. 30 tablet 11 Taking   Scheduled:  . antiseptic oral rinse  7 mL Mouth Rinse QID  . aspirin  81 mg Per Tube Daily  . atorvastatin  20 mg Per Tube q1800  . chlorhexidine gluconate  15 mL Mouth Rinse BID  . heparin  5,000 Units Subcutaneous 3 times per day  . insulin aspart  0-15 Units Subcutaneous 6 times per day  . pantoprazole (PROTONIX) IV  40 mg Intravenous QHS  . piperacillin-tazobactam (ZOSYN)  IV  3.375 g Intravenous Q8H  . sodium chloride  1,000 mL Intravenous Once  . [START ON 11/07/2015] vancomycin  500 mg Intravenous Q24H   Infusions:  . sodium chloride    . fentaNYL infusion INTRAVENOUS 50 mcg/hr (11/10/2015 0105)    Assessment: 70yo male found unresponsive by friends and foaming at the mouth, intubated and started on ABX for sepsis and possible PNA, now w/ elevated troponin, to start heparin.  Goal of Therapy:  Heparin level 0.3-0.7  units/ml Monitor platelets by anticoagulation protocol: Yes   Plan:  Rec'd heparin 5000 units SQ ~1hr ago; will start gtt at 550 units/hr and monitor heparin levels and CBC.  Wynona Neat, PharmD, BCPS  11/30/2015,2:40 AM

## 2015-11-06 NOTE — Progress Notes (Signed)
CRITICAL VALUE ALERT  Critical value received:  Lactic Acid 6.5 Troponin 3.64  Date of notification:  11/22/2015  Time of notification:  0130   Critical value read back:Yes.    Nurse who received alert:  Woodroe Chen  MD notified (1st page):  Md. Mannam  Time of first page:  [phone]

## 2015-11-06 NOTE — Progress Notes (Addendum)
[facility] Progress Note Patient Name: William Mccullough DOB: [birth year] MRN: [id number]   Date of Service  11/20/2015  HPI/Events of Note  Pt went into cardiac arrest, (second episode-- had a brief code episode in ER), code lasted for 9 min. Then achieved ROSC.   eICU Interventions  Received 4 epi, 2 bicarb, defib x2 for Vtach, Calcium 1, mag x 1. Will start levophed drip if needed. No family for discussion of change of code status.      Intervention Category Major Interventions: Respiratory failure - evaluation and management;Arrhythmia - evaluation and management  Laverle Hobby 11/13/2015, 6:31 AM

## 2015-11-06 NOTE — Progress Notes (Signed)
Inpatient Diabetes Program Recommendations  AACE/ADA: New Consensus Statement on Inpatient Glycemic Control (2015)  Target Ranges:  Prepandial:   less than 140 mg/dL      Peak postprandial:   less than 180 mg/dL (1-2 hours)      Critically ill patients:  140 - 180 mg/dL  Results for SKEET, ROSENBOOM (MRN [id number]) as of 11/28/2015 11:01  Ref. Range 11/21/2015 00:54 11/22/2015 03:25 11/22/2015 09:58  Glucose-Capillary Latest Ref Range: 65-99 mg/dL 157 (H) 125 (H) 65  Results for DIOR, PALOMAREZ (MRN [id number]) as of 11/25/2015 11:01  Ref. Range 11/18/2015 06:57  Glucose Latest Ref Range: 65-99 mg/dL 50 (L)   Review of Glycemic Control  Diabetes history: No Outpatient Diabetes medications: NA Current orders for Inpatient glycemic control: Novolog 0-15 units Q4H  Inpatient Diabetes Program Recommendations: Correction (SSI): Noted patient experienced hypoglycemia after getting Novolog correction. Please consider decreasing Novolog correction to sensitive scale.  Thanks,  Barnie Alderman, RN, MSN, CDE Diabetes Coordinator Inpatient Diabetes Program [phone] (Team Pager from [place] to [place]) [phone] ([facility] office) [phone] [facility] [place] [facility] office) [phone] [facility] office)

## 2015-11-06 NOTE — Progress Notes (Signed)
CRITICAL VALUE ALERT  Critical value received:  Trop >65  Date of notification:  12/05/2015  Time of notification: [id number]  Critical value read back:Yes.    Nurse who received alert:  Salaam Battershell, Earnest Bailey Cartner  Pt s/p cath with IABP placement. Will let MD know.

## 2015-11-06 NOTE — Progress Notes (Signed)
PULMONARY / CRITICAL CARE MEDICINE   Name: William Mccullough MRN: [id number] DOB: [birth year]    ADMISSION DATE:  11/21/2015 CONSULTATION DATE:  11/12/2015  REFERRING MD:  EDP  CHIEF COMPLAINT:  SOB  BRIEF:  70 y/o male admitted with acute hypoxemic respiratory failure on 3/1 to [facility],  Found to have bedside echo suggestive of EF 10-15%.  Developed a cardiac arrest again early AM 3/2.    SUBJECTIVE:  Cardiac arrest this morning at 0630, PCCM called emergently to bedside  VITAL SIGNS: BP 80/27 mmHg  Pulse 95  Temp(Src) 93.2 F (34 C) (Rectal)  Resp 34  Ht 5\' 9"  (1.753 m)  Wt 48.7 kg (107 lb 5.8 oz)  BMI 15.85 kg/m2  SpO2 98%  HEMODYNAMICS:    VENTILATOR SETTINGS: Vent Mode:  [-] PRVC FiO2 (%):  [100 %] 100 % Set Rate:  [14 bmp-20 bmp] 20 bmp Vt Set:  [540 mL-570 mL] 570 mL PEEP:  [5 cmH20-10 cmH20] 10 cmH20 Plateau Pressure:  [18 cmH20-28 cmH20] 25 cmH20  INTAKE / OUTPUT: I/O last 3 completed shifts: In: 1340.4 [I.V.:1340.4] Out: 270 [Urine:70; Drains:200]   PHYSICAL EXAMINATION: General: thin male, on vent HENT: NCAT ETT in place, pupils pinpoint Pulmonary: rhonchi bilaterally CV: RRR, slight systolic murmur GI: BS+, soft, nontender MSK: normal bulk, cannot assess tone Derm: no rash or skin breakdown Neuro: sedated on vent, does not withdraw to pain  LABS:  BMET  Recent Labs Lab 11/25/2015 2255 12/02/2015 0245 11/23/2015 0657  NA 142 143 144  K 4.1 3.9 4.3  CL 108 109 111  CO2 19* 17* 19*  BUN 13 14 17   CREATININE 1.90* 1.69* 2.11*  GLUCOSE 200* 140* 50*    Electrolytes  Recent Labs Lab 11/25/2015 2255 11/26/2015 0245 11/27/2015 0657  CALCIUM 8.3* 8.5* 10.1  MG  --  2.0  --   PHOS  --  5.3*  --     CBC  Recent Labs Lab 11/24/2015 2239 11/11/2015 0245 11/29/2015 0657  WBC 20.9* 10.6* 7.5  HGB 12.9* 13.5 12.1*  HCT 37.7* 41.1 35.1*  PLT 175 177 134*    Coag's  Recent Labs Lab 11/22/2015 2255  INR 1.22    Sepsis Markers  Recent  Labs Lab 11/26/2015 2230 11/13/2015 0120 11/15/2015 0245  LATICACIDVEN 6.46* 6.5* 7.5*  PROCALCITON  --  1.66  --     ABG  Recent Labs Lab 12/01/2015 0034 11/12/2015 0420 12/05/2015 0657  PHART 7.238* 7.188* 7.115*  PCO2ART 38.5 46.2* 66.5*  PO2ART 61.0* 94.0 63.0*    Liver Enzymes  Recent Labs Lab 11/17/2015 2255  AST 166*  ALT 52  ALKPHOS 109  BILITOT 0.7  ALBUMIN 2.8*    Cardiac Enzymes  Recent Labs Lab 11/14/2015 0120 11/15/2015 0245 11/30/2015 0657  TROPONINI 3.64* 10.60* 31.57*    Glucose  Recent Labs Lab 11/20/2015 0054 11/17/2015 0325  GLUCAP 157* 125*    Imaging CXR images personally reviewed> airspace disease bilaterally R > L, ETT in place  STUDIES:   CULTURES: Blood 03/01 > Urine 03/01 > Sputum 03/01 > Ur Strep 3/2 positive Influenza PCR 3/2 > negative Respiratory viral panel 3/2 >   ANTIBIOTICS: Vanc 03/02 > Zosyn 03/02 > Tamiflu 3/2 >   SIGNIFICANT EVENTS: 03/02 > admitted with acute hypoxic respiratory failure requiring intubation.  Had brief code post intubation.  LINES/TUBES: ETT 03/02 > L IJ CVL 3/2 >    ASSESSMENT / PLAN:  70 y/o male with known ischemic cardiomyopathy admitted on  3/2 with acute hypoxemic respiratory failure "foaming at mouth" and had a cardiac arrest upon induction for intubation.  Since admission he had developed progressive severe shock which is presumably cardiogenic as his bedside echo showed an EF of 10-15% and his troponins are elevated.  His strep pneumonia antigen was positive and he had a leukocytosis on admission, so part of this may be septic shock but given the cardiac issues I believe there is a significant cardiogenic component to his shock.  DDx includes influenza pneumonia/myocarditis.  He also has worsening renal failure.  Prognosis guarded at best.    PULMONARY A: Acute hypoxic respiratory failure Acute pulmonary edema Severe CAP Tobacco abuse P:   Continue full vent support Increase PEEP 14,  Decrease FiO2 60% and then repeat ABG Increase RR 28 Target PaO2 55-65 Wean as able VAP prevention measures Albuterol PRN CXR in AM  CARDIOVASCULAR A:  Rapidly progressive shock> cardiogenic and possibly septic PEA Arrest x2 NSTEMi P:  Balloon pump now Levophed now Vasopressin now Treat acidosis now Echo now CVP monitoring now Heparin infusion ASA/Plavix per cardiology LHC per cardiology  RENAL A:   AKI > worsening, due to shock AGMA - lactate> worsening Hypocalcemia - corrects to 8.8 P:   Bicarb gtt Renal consult > suspect he will need CVVHD Monitor BMET and UOP Replace electrolytes as needed   GASTROINTESTINAL A:   GI prophylaxis Nutrition P:   SUP: Pantoprazole NPO  HEMATOLOGIC / ONCOLOGIC A:   VTE Prophylaxis Hx prostate CA - last seen in 2013 and was doing well at that time P:  SCD's / Heparin CBC in AM  INFECTIOUS A:   Severe CAP? Influenza? Strep pneumo antigen positive P:   Vanc, increase ceftriaxone, continue azithro Follow cultures Empiric tamiflu until resp viral panel back  ENDOCRINE A:   Hyperglycemia - no hx DM P:   SSI Assess Hgb A1c  NEUROLOGIC A:   Acute metabolic encephalopathy Hx anxiety, depression P:   Sedation:  Fentanyl gtt / Midazolam PRN RASS goal: 0 to -1 Daily WUA Assess UDS Hold outpatient wellbutrin  Family updated: None.  Interdisciplinary Family Meeting v Palliative Care Meeting:  Due by: 03/08.  Additional CC time by me this morning outside procedures: 75 minutes  Roselie Awkward, MD [place] [facility] Pager: [phone] Cell: [phone] After 3pm or if no response, call [phone]   12/04/2015, 7:56 AM

## 2015-11-06 NOTE — Progress Notes (Signed)
Per MD at bedside, use blood pressure on arterial line for titrating pressors.

## 2015-11-06 NOTE — Progress Notes (Signed)
Pharmacy Antibiotic Note  William Mccullough is a 70 y.o. male admitted on 11/14/2015 with CHF, elevated troponins and lung infiltrates.  Pharmacy has been consulted for vancomycin, zosyn, and tamiflu dosing.  Scr elevated to 2.11, currently planning for possible CRRT.  Received 1g of vancomycin this AM at 0012 (adequate load).  Plan: 1. Vancomycin 500 mg IV q 24 hrs - next dose tomorrow. 2. Zosyn 2.25g IV q 6 hrs. 3. Tamiflu 30 mg VT daily. 4. F/u plans/initiation of CRRT. 5. F/u cultures, clinical course.   Height: 5\' 9"  (175.3 cm) Weight: 107 lb 5.8 oz (48.7 kg) IBW/kg (Calculated) : 70.7  Temp (24hrs), Avg:95.6 F (35.3 C), Min:93.2 F (34 C), Max:97.5 F (36.4 C)   Recent Labs Lab 11/18/2015 2229 11/17/2015 2230 11/18/2015 2237 11/16/2015 2239 12/05/2015 2255 11/09/2015 0120 11/22/2015 0245 12/01/2015 0657  WBC  --   --   --  20.9*  --   --  10.6* 7.5  CREATININE 1.60*  --  1.70*  --  1.90*  --  1.69* 2.11*  LATICACIDVEN  --  6.46*  --   --   --  6.5* 7.5*  --     Estimated Creatinine Clearance: 23.4 mL/min (by C-G formula based on Cr of 2.11).    No Known Allergies  Antimicrobials this admission: Azithro 3/2 >3/2 Ceftriaxone 3/2 > 3/2 Vanco 3/2 > Zosyn 3/2 >  Dose adjustments this admission: n/a  Microbiology results: 3/1 BCx: 3/2 UCx:  3/2 Sputum:  3/2 MRSA PCR: neg  Thank you for allowing pharmacy to be a part of this patient's care.  Uvaldo Rising, BCPS  Clinical Pharmacist Pager [phone]  11/30/2015 9:20 AM

## 2015-11-07 ENCOUNTER — Inpatient Hospital Stay (HOSPITAL_COMMUNITY): Payer: Medicare Other

## 2015-11-07 DIAGNOSIS — J9601 Acute respiratory failure with hypoxia: Secondary | ICD-10-CM | POA: Insufficient documentation

## 2015-11-07 LAB — BASIC METABOLIC PANEL
ANION GAP: 16 — AB (ref 5–15)
BUN: 36 mg/dL — ABNORMAL HIGH (ref 6–20)
CHLORIDE: 97 mmol/L — AB (ref 101–111)
CO2: 30 mmol/L (ref 22–32)
CREATININE: 3.54 mg/dL — AB (ref 0.61–1.24)
Calcium: 7.6 mg/dL — ABNORMAL LOW (ref 8.9–10.3)
GFR calc non Af Amer: 16 mL/min — ABNORMAL LOW (ref >60)
GFR, EST AFRICAN AMERICAN: 19 mL/min — AB (ref >60)
Glucose, Bld: 112 mg/dL — ABNORMAL HIGH (ref 65–99)
Potassium: 3.8 mmol/L (ref 3.5–5.1)
SODIUM: 143 mmol/L (ref 135–145)

## 2015-11-07 LAB — GLUCOSE, CAPILLARY
GLUCOSE-CAPILLARY: 88 mg/dL (ref 65–99)
GLUCOSE-CAPILLARY: 104 mg/dL — AB (ref 65–99)
GLUCOSE-CAPILLARY: 126 mg/dL — AB (ref 65–99)
GLUCOSE-CAPILLARY: 120 mg/dL — AB (ref 65–99)
GLUCOSE-CAPILLARY: 161 mg/dL — AB (ref 65–99)
Glucose-Capillary: 113 mg/dL — ABNORMAL HIGH (ref 65–99)

## 2015-11-07 LAB — COMPREHENSIVE METABOLIC PANEL
ALT: 510 U/L — ABNORMAL HIGH (ref 17–63)
ANION GAP: 16 — AB (ref 5–15)
AST: 724 U/L — AB (ref 15–41)
Albumin: 2 g/dL — ABNORMAL LOW (ref 3.5–5.0)
Alkaline Phosphatase: 61 U/L (ref 38–126)
BILIRUBIN TOTAL: 2.3 mg/dL — AB (ref 0.3–1.2)
BUN: 28 mg/dL — AB (ref 6–20)
CO2: 26 mmol/L (ref 22–32)
Calcium: 7.7 mg/dL — ABNORMAL LOW (ref 8.9–10.3)
Chloride: 99 mmol/L — ABNORMAL LOW (ref 101–111)
Creatinine, Ser: 2.92 mg/dL — ABNORMAL HIGH (ref 0.61–1.24)
GFR, EST AFRICAN AMERICAN: 24 mL/min — AB (ref >60)
GFR, EST NON AFRICAN AMERICAN: 21 mL/min — AB (ref >60)
Glucose, Bld: 98 mg/dL (ref 65–99)
POTASSIUM: 3.8 mmol/L (ref 3.5–5.1)
Sodium: 141 mmol/L (ref 135–145)
TOTAL PROTEIN: 4.1 g/dL — AB (ref 6.5–8.1)

## 2015-11-07 LAB — POCT I-STAT 3, ART BLOOD GAS (G3+)
ACID-BASE EXCESS: 8 mmol/L — AB (ref 0.0–2.0)
Acid-Base Excess: 8 mmol/L — ABNORMAL HIGH (ref 0.0–2.0)
BICARBONATE: 33.1 meq/L — AB (ref 20.0–24.0)
Bicarbonate: 29.7 mEq/L — ABNORMAL HIGH (ref 20.0–24.0)
O2 SAT: 98 %
O2 Saturation: 97 %
PH ART: 7.453 — AB (ref 7.350–7.450)
PO2 ART: 77 mmHg — AB (ref 80.0–100.0)
PO2 ART: 85 mmHg (ref 80.0–100.0)
Patient temperature: 98.1
TCO2: 31 mmol/L (ref 0–100)
TCO2: 35 mmol/L (ref 0–100)
pCO2 arterial: 28 mmHg — ABNORMAL LOW (ref 35.0–45.0)
pCO2 arterial: 47.1 mmHg — ABNORMAL HIGH (ref 35.0–45.0)
pH, Arterial: 7.633 (ref 7.350–7.450)

## 2015-11-07 LAB — CARBOXYHEMOGLOBIN
CARBOXYHEMOGLOBIN: 0.9 % (ref 0.5–1.5)
Carboxyhemoglobin: 1 % (ref 0.5–1.5)
Carboxyhemoglobin: 1.1 % (ref 0.5–1.5)
METHEMOGLOBIN: 0.8 % (ref 0.0–1.5)
Methemoglobin: 0.8 % (ref 0.0–1.5)
Methemoglobin: 0.9 % (ref 0.0–1.5)
O2 SAT: 46.3 %
O2 Saturation: 77.3 %
O2 Saturation: 79.2 %
TOTAL HEMOGLOBIN: 10 g/dL — AB (ref 13.5–18.0)
TOTAL HEMOGLOBIN: 9.8 g/dL — AB (ref 13.5–18.0)
TOTAL HEMOGLOBIN: 9.9 g/dL — AB (ref 13.5–18.0)

## 2015-11-07 LAB — LEGIONELLA ANTIGEN, URINE

## 2015-11-07 LAB — RESPIRATORY VIRUS PANEL
ADENOVIRUS: NEGATIVE
INFLUENZA A: NEGATIVE
Influenza B: NEGATIVE
Metapneumovirus: NEGATIVE
PARAINFLUENZA 2 A: NEGATIVE
Parainfluenza 1: NEGATIVE
Parainfluenza 3: NEGATIVE
RESPIRATORY SYNCYTIAL VIRUS B: NEGATIVE
RHINOVIRUS: NEGATIVE
Respiratory Syncytial Virus A: NEGATIVE

## 2015-11-07 LAB — URINE CULTURE: CULTURE: NO GROWTH

## 2015-11-07 LAB — HEPARIN LEVEL (UNFRACTIONATED): HEPARIN UNFRACTIONATED: 0.38 [IU]/mL (ref 0.30–0.70)

## 2015-11-07 LAB — CBC WITH DIFFERENTIAL/PLATELET
BASOS ABS: 0 10*3/uL (ref 0.0–0.1)
Basophils Relative: 0 %
EOS ABS: 0 10*3/uL (ref 0.0–0.7)
Eosinophils Relative: 0 %
HCT: 28.6 % — ABNORMAL LOW (ref 39.0–52.0)
HEMOGLOBIN: 9.8 g/dL — AB (ref 13.0–17.0)
LYMPHS PCT: 8 %
Lymphs Abs: 1.1 10*3/uL (ref 0.7–4.0)
MCH: 27 pg (ref 26.0–34.0)
MCHC: 34.3 g/dL (ref 30.0–36.0)
MCV: 78.8 fL (ref 78.0–100.0)
MONOS PCT: 3 %
Monocytes Absolute: 0.4 10*3/uL (ref 0.1–1.0)
NEUTROS PCT: 89 %
Neutro Abs: 12.1 10*3/uL — ABNORMAL HIGH (ref 1.7–7.7)
Platelets: 86 10*3/uL — ABNORMAL LOW (ref 150–400)
RBC: 3.63 MIL/uL — AB (ref 4.22–5.81)
RDW: 13.6 % (ref 11.5–15.5)
WBC MORPHOLOGY: INCREASED
WBC: 13.6 10*3/uL — AB (ref 4.0–10.5)

## 2015-11-07 LAB — PROTIME-INR
INR: 2.49 — AB (ref 0.00–1.49)
PROTHROMBIN TIME: 26.6 s — AB (ref 11.6–15.2)

## 2015-11-07 LAB — FIBRINOGEN: FIBRINOGEN: 522 mg/dL — AB (ref 204–475)

## 2015-11-07 LAB — LACTIC ACID, PLASMA
Lactic Acid, Venous: 2.7 mmol/L (ref 0.5–2.0)
Lactic Acid, Venous: 2.6 mmol/L (ref 0.5–2.0)

## 2015-11-07 LAB — PHOSPHORUS: Phosphorus: 2.8 mg/dL (ref 2.5–4.6)

## 2015-11-07 LAB — APTT

## 2015-11-07 LAB — HEMOGLOBIN A1C
HEMOGLOBIN A1C: 6.2 % — AB (ref 4.8–5.6)
MEAN PLASMA GLUCOSE: 131 mg/dL

## 2015-11-07 LAB — MAGNESIUM: MAGNESIUM: 1.6 mg/dL — AB (ref 1.7–2.4)

## 2015-11-07 LAB — PROCALCITONIN

## 2015-11-07 MED ORDER — FUROSEMIDE 10 MG/ML IJ SOLN
80.0000 mg | Freq: Once | INTRAMUSCULAR | Status: AC
  Administered 2015-11-07: 80 mg via INTRAVENOUS
  Filled 2015-11-07: qty 8

## 2015-11-07 MED ORDER — DEXTROSE 5 % IV SOLN
2.0000 g | Freq: Two times a day (BID) | INTRAVENOUS | Status: DC
  Administered 2015-11-07 – 2015-11-18 (×23): 2 g via INTRAVENOUS
  Filled 2015-11-07 (×25): qty 2

## 2015-11-07 MED ORDER — VITAL AF 1.2 CAL PO LIQD
1000.0000 mL | ORAL | Status: DC
  Administered 2015-11-07 – 2015-11-13 (×7): 1000 mL
  Filled 2015-11-07: qty 1000

## 2015-11-07 MED ORDER — SODIUM CHLORIDE 0.9 % IV BOLUS (SEPSIS)
500.0000 mL | Freq: Once | INTRAVENOUS | Status: AC
  Administered 2015-11-07: 500 mL via INTRAVENOUS

## 2015-11-07 MED ORDER — VITAL HIGH PROTEIN PO LIQD
1000.0000 mL | ORAL | Status: DC
  Administered 2015-11-07 (×2)

## 2015-11-07 MED ORDER — SODIUM CHLORIDE 0.9 % IV SOLN
INTRAVENOUS | Status: DC
  Administered 2015-11-07 – 2015-11-10 (×2): via INTRAVENOUS

## 2015-11-07 MED ORDER — LEVOFLOXACIN IN D5W 500 MG/100ML IV SOLN
500.0000 mg | INTRAVENOUS | Status: DC
  Filled 2015-11-07: qty 100

## 2015-11-07 MED ORDER — SODIUM CHLORIDE 0.9 % IV BOLUS (SEPSIS)
1000.0000 mL | Freq: Once | INTRAVENOUS | Status: AC
  Administered 2015-11-07: 1000 mL via INTRAVENOUS

## 2015-11-07 MED ORDER — MILRINONE IN DEXTROSE 20 MG/100ML IV SOLN
0.1250 ug/kg/min | INTRAVENOUS | Status: DC
  Administered 2015-11-07 – 2015-11-09 (×3): 0.25 ug/kg/min via INTRAVENOUS
  Filled 2015-11-07 (×3): qty 100

## 2015-11-07 MED ORDER — OSELTAMIVIR PHOSPHATE 6 MG/ML PO SUSR: 30.0000 mg | ORAL | Status: DC

## 2015-11-07 MED ORDER — VANCOMYCIN HCL IN DEXTROSE 750-5 MG/150ML-% IV SOLN: 750.0000 mg | INTRAVENOUS | Status: DC

## 2015-11-07 MED ORDER — MAGNESIUM SULFATE 2 GM/50ML IV SOLN
2.0000 g | Freq: Once | INTRAVENOUS | Status: AC
Start: 2015-11-07 — End: 2015-11-07
  Administered 2015-11-07: 2 g via INTRAVENOUS
  Filled 2015-11-07: qty 50

## 2015-11-07 MED FILL — Heparin Sodium (Porcine) 2 Unit/ML in Sodium Chloride 0.9%: INTRAMUSCULAR | Qty: 1000 | Status: AC

## 2015-11-07 MED FILL — Heparin Sodium (Porcine) 2 Unit/ML in Sodium Chloride 0.9%: INTRAMUSCULAR | Qty: 500 | Status: CN

## 2015-11-07 NOTE — Progress Notes (Signed)
Pharmacist Heart Failure Core Measure Documentation  Assessment: William Mccullough has an EF documented as 15-20% on 11/18/2015 by Echo.  Rationale: Heart failure patients with left ventricular systolic dysfunction (LVSD) and an EF < 40% should be prescribed an angiotensin converting enzyme inhibitor (ACEI) or angiotensin receptor blocker (ARB) at discharge unless a contraindication is documented in the medical record.  This patient is not currently on an ACEI or ARB for HF.  This note is being placed in the record in order to provide documentation that a contraindication to the use of these agents is present for this encounter.  ACE Inhibitor or Angiotensin Receptor Blocker is contraindicated (specify all that apply)  []   ACEI allergy AND ARB allergy []   Angioedema []   Moderate or severe aortic stenosis []   Hyperkalemia []   Hypotension []   Renal artery stenosis [x]   Worsening renal function, preexisting renal disease or dysfunction  Hildred Laser, Pharm D 11/07/2015 11:13 AM

## 2015-11-07 NOTE — H&P (Signed)
PULMONARY / CRITICAL CARE MEDICINE   Name: William Mccullough MRN: [id number] DOB: 26-Jan-1946    ADMISSION DATE:  12/04/2015 CONSULTATION DATE:  11/21/2015  REFERRING MD:  EDP  CHIEF COMPLAINT:  SOB  HISTORY OF PRESENT ILLNESS:  Pt is encephelopathic; therefore, this HPI is obtained from chart review. William Mccullough is a 70 y.o. male with PMH as outlined below. FOund down, MODS, cardiogenic shock.  SUBJECTIVE:  Cath lab, balloon pump, shock, ARF  VITAL SIGNS: BP 116/42 mmHg  Pulse 73  Temp(Src) 97.9 F (36.6 C) (Core (Comment))  Resp 22  Ht 5' 9"  (1.753 m)  Wt 55.1 kg (121 lb 7.6 oz)  BMI 17.93 kg/m2  SpO2 96%  HEMODYNAMICS: CVP:  [8 mmHg-12 mmHg] 10 mmHg  VENTILATOR SETTINGS: Vent Mode:  [-] PRVC FiO2 (%):  [50 %-60 %] 50 % Set Rate:  [10 bmp-22 bmp] 10 bmp Vt Set:  [495 mL-570 mL] 495 mL PEEP:  [10 cmH20] 10 cmH20 Plateau Pressure:  [17 cmH20-32 cmH20] 17 cmH20  INTAKE / OUTPUT: I/O last 3 completed shifts: In: 6051.6 [I.V.:5511.6; NG/GT:140; IV Piggyback:400] Out: 2175 [Urine:1975; Drains:200]   PHYSICAL EXAMINATION: General: AA male, thin, cachectic Neuro: rass -1, opens eyes, deviated left eye HEENT: [place]/AT. PERR Cardiovascular: RRR, no M/R/G.  Lungs: coarse crackles Abdomen: BS x 4, soft, NT/ND.  Musculoskeletal: No gross deformities, no edema.  Skin: Intact, warm, no rashes.  LABS:  BMET  Recent Labs Lab 11/14/2015 1437 11/29/2015 1833 11/07/15 0325  NA 144 142 141  K 3.4* 3.5 3.8  CL 105 102 99*  CO2 28 27 26   BUN 21* 23* 28*  CREATININE 2.41* 2.61* 2.92*  GLUCOSE 130* 109* 98    Electrolytes  Recent Labs Lab 11/22/2015 0245  11/17/2015 1437 11/20/2015 1833 11/07/15 0325  CALCIUM 8.5*  < > 8.2* 8.0* 7.7*  MG 2.0  --   --   --   --   PHOS 5.3*  --  3.0 2.7 2.8  < > = values in this interval not displayed.  CBC  Recent Labs Lab 12/02/2015 0245 11/22/2015 0657 11/07/15 0325  WBC 10.6* 7.5 13.6*  HGB 13.5 12.1* 9.8*  HCT 41.1 35.1* 28.6*   PLT 177 134* 86*    Coag's  Recent Labs Lab 12/05/2015 2255  INR 1.22    Sepsis Markers  Recent Labs Lab 11/25/2015 2230 12/02/2015 0120 11/20/2015 0245 11/07/15 0325  LATICACIDVEN 6.46* 6.5* 7.5*  --   PROCALCITON  --  1.66  --  >175.00    ABG  Recent Labs Lab 11/22/2015 1124 11/17/2015 1619 11/07/15 0944  PHART 7.461* 7.495* 7.633*  PCO2ART 30.7* 35.9 28.0*  PO2ART 96.0 80.0 77.0*    Liver Enzymes  Recent Labs Lab 12/02/2015 2255 12/01/2015 1437 12/04/2015 1833 11/07/15 0325  AST 166*  --   --  724*  ALT 52  --   --  510*  ALKPHOS 109  --   --  61  BILITOT 0.7  --   --  2.3*  ALBUMIN 2.8* 2.0* 2.0* 2.0*    Cardiac Enzymes  Recent Labs Lab 11/26/2015 0958 11/14/2015 1150 11/15/2015 1833  TROPONINI >65.00* >65.00* >65.00*    Glucose  Recent Labs Lab 11/10/2015 1148 11/29/2015 1615 12/01/2015 1958 11/12/2015 2341 11/07/15 0333 11/07/15 0833  GLUCAP 153* 105* 101* 85 88 104*    Imaging US Renal  11/18/2015  CLINICAL DATA:  Acute renal injury EXAM: RENAL / URINARY TRACT ULTRASOUND COMPLETE COMPARISON:  None. FINDINGS: Right Kidney:  Length: 10.8 cm. Echogenicity within normal limits. No mass or hydronephrosis visualized. Left Kidney: Length: 10.3 cm. No hydronephrosis is noted. Calcifications are identified within the renal pelvis consistent with renal calculi. The largest of these measures 9 mm. Bladder: Decompressed by Foley catheter. IMPRESSION: Left renal calculi without acute abnormality. Electronically Signed   By: Inez Catalina M.D.   On: 11/18/2015 18:54   Dg Chest Port 1 View  11/07/2015  CLINICAL DATA:  Respiratory failure. EXAM: PORTABLE CHEST 1 VIEW COMPARISON:  11/18/2015. FINDINGS: Endotracheal tube, NG tube, left IJ line stable position. Balloon pump in stable position. Cardiomegaly with normal pulmonary vascularity. Right lower lobe infiltrate consistent pneumonia. No pleural effusion or pneumothorax . IMPRESSION: 1. Lines and tubes stable position. Balloon  pump in stable position. 2. Persistent right lower lobe infiltrate consistent pneumonia. Similar findings noted on prior exam. 3. Cardiomegaly.  No pulmonary venous congestion . Electronically Signed   By: Marcello Moores  Register   On: 11/07/2015 07:14   Dg Chest Port 1 View  11/20/2015  CLINICAL DATA:  Evaluate intra-aortic balloon pump placement. EXAM: PORTABLE CHEST 1 VIEW COMPARISON:  11/10/2015 at 8:08 a.m. FINDINGS: The metallic portion of the intra-aortic balloon pump is superimposed upon the inferior margin of the aortic knob, well positioned. Endotracheal tube, left internal jugular central venous line and oral/ nasogastric tube are stable. There is improved right lung aeration when compared the prior study. There is significantly less airspace opacification. Residual airspace opacification is noted at the right base. IMPRESSION: 1. Metal tip of the intra-aortic balloon pump projects along the inferior margin of the aortic knob. 2. Significant improvement in right lung aeration since the earlier exam. No new lung abnormalities. Electronically Signed   By: Lajean Manes M.D.   On: 11/13/2015 19:16   STUDIES:  CXR 03/01 > cardiomegaly with asymmetric interstitial edema right side. Echo pa 33, EF global 15% Cath neg  CULTURES: Blood 03/01 >>> Urine 03/01 >>> Sputum 03/01 >>> Leg>>>neg Strep>>>POS  ANTIBIOTICS: Vanc 03/02 >>> Zosyn 03/02 >>>  SIGNIFICANT EVENTS: 03/02 > admitted with acute hypoxic respiratory failure requiring intubation.  Had brief code post intubation. 3/2- balloon pump, renal failure  LINES/TUBES: ETT 03/02 > Left IJ 3/2>>> Aline 3/2>>>   ASSESSMENT / PLAN:  PULMONARY A: Acute hypoxic respiratory failure - required intubation in ED. CAP P:   Alkalotic noted Reduce rate, T V, repeat ABG pcxr in am   CARDIOVASCULAR A:  Septic shock, septic cardiomyoapthy Brief cardiac arrest post intubation - ROSC after 1 - 2 minutes.  Suspect related to hypotension due to  RSI meds. Hypotension - suspect medication related due to RSI. Now gradually resolving and vasopressors being weaned down. Troponin leak - suspect due to demand ischemia. Hx HTN, cardiomyopathy (last echo from 2013 with EF 40-45%, grade 1DD.  Of note, per EDP, bedside cardiac POCUS revealed very poor EF estimated at 10-15%). P:  Balloon pump mAP goal met 60 cvp 10 Cortisol if shock reoccur  RENAL A:   AKI, ATN AGMA - lactate.  P:   Dc bicarb Add saline at 75 cc/hr Chem in am   GASTROINTESTINAL A:   GI prophylaxis. Nutrition. Shock liver P:   SUP: Pantoprazole. NPO start TF  HEMATOLOGIC / ONCOLOGIC A:   VTE Prophylaxis. Hx prostate CA - last seen in 2013 and was doing well at that time. Thrombocytopenia *(sepsis) P:  SCD's / Heparin. CBC in AM Follow plat count Repeat coags  INFECTIOUS A:   Septic shock, PAN,  strep pos ag P:   Abx as above (vanc / zosyn). As unclear if strep is organism, consider to ceftriaxone BBB Follow cultures as above. May need CT chest for loculated fluid? Follow BP, pressors Dc tamiflu  ENDOCRINE A:   Hyperglycemia - no hx DM. P:   SSI. Assess Hgb A1c.  NEUROLOGIC A:   Acute metabolic encephalopathy. Hx anxiety, depression. R/o subclinical seziures P:   Sedation:  Fentanyl gtt / Midazolam PRN. RASS goal: 0 to -1. Daily WUA Stat CT head, eeg May need LP Consider to BBb coverage  Family updated: None.  Interdisciplinary Family Meeting v Palliative Care Meeting:  Due by: 03/08.  CC time: 50 minutes.   Lavon Paganini. Titus Mould, MD, [place] Pgr: [facility]

## 2015-11-07 NOTE — Progress Notes (Signed)
Initial Nutrition Assessment  DOCUMENTATION CODES:   Underweight, Severe malnutrition in context of chronic illness  INTERVENTION:   Initiate Vital AF 1.2 @ 25 ml/hr via OG tube and increase by 10 ml every 12 hours to goal rate of 55 ml/hr.   Tube feeding regimen provides 1584 kcal (102% of needs), 99 grams of protein, and 1070 ml of H2O.   Recommend continue to monitor magnesium, potassium, and phosphorus daily for at least 3 days, MD to replete as needed, as pt is at risk for refeeding syndrome given severe malnutrition.  NUTRITION DIAGNOSIS:   Malnutrition related to chronic illness as evidenced by severe depletion of body fat, severe depletion of muscle mass.  GOAL:   Patient will meet greater than or equal to 90% of their needs  MONITOR:   TF tolerance, Skin, Vent status, Labs  REASON FOR ASSESSMENT:   Consult Enteral/tube feeding initiation and management  ASSESSMENT:   Pt admitted on 11/21/2015 with SOB. He is noted s/p PEA arrest on IABP and with AKI and PNA.   Patient is currently intubated on ventilator support MV: 5.4 L/min Temp (24hrs), Avg:98.5 F (36.9 C), Min:97 F (36.1 C), Max:100.2 F (37.9 C)  Medications reviewed and include: sodium bicarb Labs reviewed: calcium low 7.7, potassium, magnesium, and phosphorus are WNL OG tube with Vital High Protein Nutrition-Focused physical exam completed. Findings are severe fat depletion, severe muscle depletion, and no edema.  Not much nutrition hx available. RN at bedside and unaware of any ETOH use.   Diet Order:  Diet NPO time specified  Skin:  Wound (see comment) (stage I sacrum)  Last BM:  3/2  Height:   Ht Readings from Last 1 Encounters:  12/05/2015 5\' 9"  (1.753 m)    Weight:   Wt Readings from Last 1 Encounters:  11/07/15 121 lb 7.6 oz (55.1 kg)    Ideal Body Weight:  72.7 kg  BMI:  Body mass index is 17.93 kg/(m^2).  Estimated Nutritional Needs:   Kcal:  [id number]  Protein:   85-100  Fluid:  >1.5 L/day  EDUCATION NEEDS:   No education needs identified at this time  [place], [place], [place] Pager [phone] After Hours Pager

## 2015-11-07 NOTE — Progress Notes (Signed)
IABP site oozing. Pressure held x10 mins.

## 2015-11-07 NOTE — Progress Notes (Signed)
Successful portable CT scan in pt room. No apparent complications. RT will continue to monitor.

## 2015-11-07 NOTE — Progress Notes (Signed)
Pharmacy Antibiotic Note  William Mccullough is a 70 y.o. male admitted on 11/30/2015 with SOB. He is noted s/p PEA arrest on IABP and with AKI and PNA.  Pharmacy has been consulted for levaquin now in addition to zosyn and vancomycin dosing. -WBC 13.6, tmax= 100.2, strep antigen positive, PCT > 175, LA= 7.5, flu PCR negative and awaiting the respiratory viral panel  Plan: 1) Levaquin 500mg  IV q48hr 2) Tamiflu 30mg  po q48hr 3) No zosyn dose changes needed. Could consider streamline to rocephin 4) Possible HD plans. No changes in vancomycin dosing now. Could consider d/c vancomycin 5) Will follow renal function, cultures and clinical progress   Height: 5\' 9"  (175.3 cm) Weight: 121 lb 7.6 oz (55.1 kg) IBW/kg (Calculated) : 70.7  Temp (24hrs), Avg:99 F (37.2 C), Min:97.3 F (36.3 C), Max:100.2 F (37.9 C)   Recent Labs Lab 11/24/2015 2230  11/21/2015 2239  11/23/2015 0120 11/13/2015 0245 11/27/2015 0657 11/07/2015 1437 11/18/2015 1833 11/07/15 0325  WBC  --   --  20.9*  --   --  10.6* 7.5  --   --  13.6*  CREATININE  --   < >  --   < >  --  1.69* 2.11* 2.41* 2.61* 2.92*  LATICACIDVEN 6.46*  --   --   --  6.5* 7.5*  --   --   --   --   < > = values in this interval not displayed.  Estimated Creatinine Clearance: 19.1 mL/min (by C-G formula based on Cr of 2.92).    No Known Allergies  Antimicrobials this admission: CTX 3/2 >>3/2 Azith 3/2 >>3/2 Vanc 3/2 > Zosyn 3/2 > Tamiflu 3/2 >> (3/7) Levaquin 3/3 >>  Microbiology results: 3/2 Resp cx >> 3/2 RVP >> 3/2 Legionella >> 3/2 Strep UA >> pos 3/2 Urine >> 3/1 Blood x2>>ngtd 3/2 MRSA PCR- neg  Thank you for allowing pharmacy to be a part of this patient's care.  Melburn Popper, PharmD Clinical Pharmacy Resident Pager: [phone] 11/07/2015 11:12 AM

## 2015-11-07 NOTE — Progress Notes (Signed)
IABP site noted to be oozing again. Pressure held x5 mins

## 2015-11-07 NOTE — Progress Notes (Addendum)
Pharmacy Antibiotic Note  William Mccullough is a 70 y.o. male admitted on 11/30/2015 with SOB. He is noted s/p PEA arrest on IABP and with AKI and PNA.  Pharmacy has been consulted for zosyn and vancomycin dosing. Patient is noted with AKI w/ SCr trend up.  -WBC 13.6, tmax= 100.2, strep antigen positive, PCT > 175, LA= 7.5, flu PCR negative and awaiting the respiratory viral panel  Plan: -Change vancomycin to 750mg  IV q48h -Change Tamiflu to 30mg  po q48hr -No zosyn dose changes needed. Could consider streamline to rocephin -Will follow renal function, cultures and clinical progress   Height: 5\' 9"  (175.3 cm) Weight: 121 lb 7.6 oz (55.1 kg) IBW/kg (Calculated) : 70.7  Temp (24hrs), Avg:98.8 F (37.1 C), Min:96.6 F (35.9 C), Max:100.2 F (37.9 C)   Recent Labs Lab 11/13/2015 2230  11/14/2015 2239  11/05/2015 0120 11/16/2015 0245 11/23/2015 0657 11/17/2015 1437 11/21/2015 1833 11/07/15 0325  WBC  --   --  20.9*  --   --  10.6* 7.5  --   --  13.6*  CREATININE  --   < >  --   < >  --  1.69* 2.11* 2.41* 2.61* 2.92*  LATICACIDVEN 6.46*  --   --   --  6.5* 7.5*  --   --   --   --   < > = values in this interval not displayed.  Estimated Creatinine Clearance: 19.1 mL/min (by C-G formula based on Cr of 2.92).    No Known Allergies  Antimicrobials this admission: CTX 3/2 >>3/2 Azith 3/2 >>3/2 Vanc 3/2 > Zosyn 3/2 > Tamiflu 3/2 >> (3/7)  Microbiology results: 3/2 Resp cx >> 3/2 RVP >> 3/2 Legionella >> 3/2 Strep UA >> pos 3/2 Urine >> 3/1 Blood x2>>ngtd 3/2 MRSA PCR- neg  Thank you for allowing pharmacy to be a part of this patient's care.  Hildred Laser, Pharm D 11/07/2015 8:44 AM

## 2015-11-07 NOTE — Progress Notes (Signed)
eLink Physician-Brief Progress Note Patient Name: William Mccullough DOB: November 27, 1945 MRN: [id number]   Date of Service  11/07/2015  HPI/Events of Note  Rising norepi needs. CVP 5-6. Note cardiomyopathy, but feel we can drive CVP up a bit. Will bolus NS 1L over 2 h  eICU Interventions       Intervention Category Intermediate Interventions: Hypotension - evaluation and management  Andreus Cure S. 11/07/2015, 4:36 PM

## 2015-11-07 NOTE — Procedures (Signed)
ELECTROENCEPHALOGRAM REPORT  Date of Study: 11/07/2015  Patient's Name: William Mccullough MRN: [id number] Date of Birth: 1946-06-11  Referring Provider: Dr. Merrie Roof  Clinical History: This is a 70 year old man with hypoxic respiratory failure with brief cardiac arrest post-intubation. Noted to have left eye gaze. EEG to assess for subclinical seizures.  Medications: fentaNYL (SUBLIMAZE) 2,500 mcg in sodium chloride 0.9 % 250 mL (10 mcg/mL) infusion albuterol (PROVENTIL) (2.5 MG/3ML) 0.083% nebulizer solution 2.5 mg aspirin chewable tablet 81 mg cefTRIAXone (ROCEPHIN) 2 g in dextrose 5 % 50 mL IVPB milrinone (PRIMACOR) 20 MG/100ML (0.2 mg/mL) infusion norepinephrine (LEVOPHED) 16 mg in dextrose 5 % 250 mL (0.064 mg/mL) infusion pantoprazole (PROTONIX) injection 40 mg vancomycin (VANCOCIN) 500 mg in sodium chloride 0.9 % 100 mL IVPB vasopressin (PITRESSIN) 40 Units in sodium chloride 0.9 % 250 mL (0.16 Units/mL) infusion  Technical Summary: A multichannel digital EEG recording measured by the international 10-20 system with electrodes applied with paste and impedances below 5000 ohms performed as portable with EKG monitoring in an intubated and sedated patient.  Hyperventilation and photic stimulation were not performed.  The digital EEG was referentially recorded, reformatted, and digitally filtered in a variety of bipolar and referential montages for optimal display.   Description: The patient is intubated and sedated on Fentanyl during the recording. There is no clear posterior dominant rhythm. The background consists of a large amount of diffuse 4-6 Hz theta and 2-3 Hz delta slowing. Normal sleep architecture is not seen. With noxious stimulation, there is slight increase in faster frequencies and muscle artifact. Hyperventilation and photic stimulation were not performed.  There were no epileptiform discharges or electrographic seizures seen.    EKG lead was  unremarkable.  Impression: This sedated EEG is abnormal due to moderate diffuse slowing of the background.  Clinical Correlation of the above findings indicates diffuse cerebral dysfunction that is non-specific in etiology and can be seen with hypoxic/ischemic injury, toxic/metabolic encephalopathies, or medication effect from Fentanyl. There were no electrographic seizures seen in this study.    Ellouise Newer, M.D.

## 2015-11-07 NOTE — Progress Notes (Signed)
[facility] ROUNDING NOTE   Subjective:   Interval History: some urine output  Objective:  Vital signs in last 24 hours:  Temp:  [97.2 F (36.2 C)-100.2 F (37.9 C)] 97.9 F (36.6 C) (03/03 1000) Pulse Rate:  [33-89] 73 (03/03 1000) Resp:  [20-28] 22 (03/03 1000) BP: (116-237)/(40-77) 116/42 mmHg (03/03 0900) SpO2:  [93 %-100 %] 96 % (03/03 1000) Arterial Line BP: (110-251)/(32-117) 120/55 mmHg (03/03 1000) FiO2 (%):  [50 %-60 %] 50 % (03/03 0853) Weight:  [55.1 kg (121 lb 7.6 oz)] 55.1 kg (121 lb 7.6 oz) (03/03 0500)  Weight change: 6.4 kg (14 lb 1.8 oz) Filed Weights   12/03/2015 0000 11/07/15 0500  Weight: 48.7 kg (107 lb 5.8 oz) 55.1 kg (121 lb 7.6 oz)    Intake/Output: I/O last 3 completed shifts: In: 6051.6 [I.V.:5511.6; NG/GT:140; IV Piggyback:400] Out: 2175 [Urine:1975; Drains:200]   Intake/Output this shift:  Total I/O In: 65 [I.V.:65] Out: 113 [Urine:113]  CVS- RRR RS- CTA  Intubated  Diminished  ABD- BS present soft non-distended EXT- no edema   Basic Metabolic Panel:  Recent Labs Lab 12/02/2015 0245 11/24/2015 0657 11/21/2015 1437 11/21/2015 1833 11/07/15 0325  NA 143 144 144 142 141  K 3.9 4.3 3.4* 3.5 3.8  CL 109 111 105 102 99*  CO2 17* 19* 28 27 26   GLUCOSE 140* 50* 130* 109* 98  BUN 14 17 21* 23* 28*  CREATININE 1.69* 2.11* 2.41* 2.61* 2.92*  CALCIUM 8.5* 10.1 8.2* 8.0* 7.7*  MG 2.0  --   --   --   --   PHOS 5.3*  --  3.0 2.7 2.8    Liver Function Tests:  Recent Labs Lab 12/02/2015 2255 11/07/2015 1437 11/18/2015 1833 11/07/15 0325  AST 166*  --   --  724*  ALT 52  --   --  510*  ALKPHOS 109  --   --  61  BILITOT 0.7  --   --  2.3*  PROT 5.4*  --   --  4.1*  ALBUMIN 2.8* 2.0* 2.0* 2.0*   No results for input(s): LIPASE, AMYLASE in the last 168 hours. No results for input(s): AMMONIA in the last 168 hours.  CBC:  Recent Labs Lab 11/12/2015 2237 11/14/2015 2239 11/14/2015 0245 11/15/2015 0657 11/07/15 0325  WBC  --   20.9* 10.6* 7.5 13.6*  NEUTROABS  --  16.5*  --   --  12.1*  HGB 15.6 12.9* 13.5 12.1* 9.8*  HCT 46.0 37.7* 41.1 35.1* 28.6*  MCV  --  84.2 84.0 84.0 78.8  PLT  --  175 177 134* 86*    Cardiac Enzymes:  Recent Labs Lab 12/02/2015 0245 12/05/2015 0657 11/12/2015 0958 11/21/2015 1150 11/14/2015 1833  TROPONINI 10.60* 31.57* >65.00* >65.00* >65.00*    BNP: Invalid input(s): POCBNP  CBG:  Recent Labs Lab 11/23/2015 1615 11/16/2015 1958 11/09/2015 2341 11/07/15 0333 11/07/15 0833  GLUCAP 105* 101* 85 49 104*    Microbiology: Results for orders placed or performed during the hospital encounter of 11/25/2015  Blood culture (routine x 2)     Status: None (Preliminary result)   Collection Time: 12/03/2015 10:40 PM  Result Value Ref Range Status   Specimen Description BLOOD RIGHT ARM  Final   Special Requests BOTTLES DRAWN AEROBIC AND ANAEROBIC 5ML  Final   Culture NO GROWTH < 24 HOURS  Final   Report Status PENDING  Incomplete  Blood culture (routine x 2)     Status: None (  Preliminary result)   Collection Time: 11/26/2015 10:55 PM  Result Value Ref Range Status   Specimen Description BLOOD RIGHT HAND  Final   Special Requests IN PEDIATRIC BOTTLE 1ML  Final   Culture NO GROWTH < 24 HOURS  Final   Report Status PENDING  Incomplete  MRSA PCR Screening     Status: None   Collection Time: 11/11/2015 12:54 AM  Result Value Ref Range Status   MRSA by PCR NEGATIVE NEGATIVE Final    Comment:        The GeneXpert MRSA Assay (FDA approved for NASAL specimens only), is one component of a comprehensive MRSA colonization surveillance program. It is not intended to diagnose MRSA infection nor to guide or monitor treatment for MRSA infections.   Respiratory virus panel     Status: None   Collection Time: 11/28/2015  3:39 AM  Result Value Ref Range Status   Respiratory Syncytial Virus A Negative Negative Final   Respiratory Syncytial Virus B Negative Negative Final   Influenza A Negative Negative  Final   Influenza B Negative Negative Final   Parainfluenza 1 Negative Negative Final   Parainfluenza 2 Negative Negative Final   Parainfluenza 3 Negative Negative Final   Metapneumovirus Negative Negative Final   Rhinovirus Negative Negative Final   Adenovirus Negative Negative Final    Comment: (NOTE) Performed At: [facility] [place], [place] [id number] Lindon Romp MD [id number]     Coagulation Studies:  Recent Labs  11/29/2015 2255  LABPROT 15.6*  INR 1.22    Urinalysis:  Recent Labs  12/05/2015 0353  COLORURINE YELLOW  LABSPEC 1.010  PHURINE 5.0  GLUCOSEU NEGATIVE  HGBUR LARGE*  BILIRUBINUR NEGATIVE  KETONESUR NEGATIVE  PROTEINUR 30*  NITRITE NEGATIVE  LEUKOCYTESUR NEGATIVE      Imaging: US Renal  11/30/2015  CLINICAL DATA:  Acute renal injury EXAM: RENAL / URINARY TRACT ULTRASOUND COMPLETE COMPARISON:  None. FINDINGS: Right Kidney: Length: 10.8 cm. Echogenicity within normal limits. No mass or hydronephrosis visualized. Left Kidney: Length: 10.3 cm. No hydronephrosis is noted. Calcifications are identified within the renal pelvis consistent with renal calculi. The largest of these measures 9 mm. Bladder: Decompressed by Foley catheter. IMPRESSION: Left renal calculi without acute abnormality. Electronically Signed   By: Inez Catalina M.D.   On: 12/05/2015 18:54   Dg Chest Port 1 View  11/07/2015  CLINICAL DATA:  Respiratory failure. EXAM: PORTABLE CHEST 1 VIEW COMPARISON:  11/05/2015. FINDINGS: Endotracheal tube, NG tube, left IJ line stable position. Balloon pump in stable position. Cardiomegaly with normal pulmonary vascularity. Right lower lobe infiltrate consistent pneumonia. No pleural effusion or pneumothorax . IMPRESSION: 1. Lines and tubes stable position. Balloon pump in stable position. 2. Persistent right lower lobe infiltrate consistent pneumonia. Similar findings noted on prior exam. 3. Cardiomegaly.  No pulmonary venous  congestion . Electronically Signed   By: Marcello Moores  Register   On: 11/07/2015 07:14   Dg Chest Port 1 View  11/20/2015  CLINICAL DATA:  Evaluate intra-aortic balloon pump placement. EXAM: PORTABLE CHEST 1 VIEW COMPARISON:  11/21/2015 at 8:08 a.m. FINDINGS: The metallic portion of the intra-aortic balloon pump is superimposed upon the inferior margin of the aortic knob, well positioned. Endotracheal tube, left internal jugular central venous line and oral/ nasogastric tube are stable. There is improved right lung aeration when compared the prior study. There is significantly less airspace opacification. Residual airspace opacification is noted at the right base. IMPRESSION: 1. Metal tip of the  intra-aortic balloon pump projects along the inferior margin of the aortic knob. 2. Significant improvement in right lung aeration since the earlier exam. No new lung abnormalities. Electronically Signed   By: Lajean Manes M.D.   On: 11/11/2015 19:16   Dg Chest Port 1 View  11/07/2015  CLINICAL DATA:  Central line placement EXAM: PORTABLE CHEST 1 VIEW COMPARISON:  Chest x-ray from earlier same day and from 11/09/2015. FINDINGS: Heart size is stable. Overall cardiomediastinal silhouette is stable in size and configuration. Endotracheal tube remains well positioned with tip approximately 3 cm above the level of the carina. Nasogastric tube passes below the diaphragm. New left IJ central line in place with tip adequately positioned over the mid SVC. No pneumothorax seen. Diffuse airspace opacities are again noted throughout the right lung. Again noted is a probable small right pleural effusion, unchanged. No new lung findings. IMPRESSION: 1. New left IJ central line appears adequately positioned with tip projected over the mid SVC. No pneumothorax. 2. Otherwise stable chest x-ray. Diffuse airspace opacities throughout the right lung, most prominent at the right lung base, unchanged in the short-term interval, most suggestive of  pneumonia versus asymmetric pulmonary edema. Probable small right pleural effusion. Left lung remains clear. Electronically Signed   By: Franki Cabot M.D.   On: 11/21/2015 08:21   Dg Chest Port 1 View  11/20/2015  CLINICAL DATA:  Respiratory failure. EXAM: PORTABLE CHEST 1 VIEW COMPARISON:  11/29/2015. FINDINGS: Endotracheal tube, NG tube in stable position. Heart size stable. Stable cardiomegaly. Diffuse right lung infiltrate, all slight interim improvement. Mild atelectasis and/or infiltrate left lung base. Small right pleural effusion cannot be excluded. No pneumothorax . Previously identified tiny metallic density noted along the right lateral chest well no longer identified. IMPRESSION: 1. Lines and tubes in stable position. 2. Diffuse right lung infiltrate, slight interim improvement. Mild left lower lobe atelectasis and/or infiltrate. Small right pleural effusion cannot be excluded. Electronically Signed   By: Marcello Moores  Register   On: 11/23/2015 07:16   Dg Chest Portable 1 View  12/03/2015  CLINICAL DATA:  Intubation. Respiratory distress. History of hypertension, prostate cancer. EXAM: PORTABLE CHEST 1 VIEW COMPARISON:  Chest radiograph September 08, 2011 FINDINGS: Endotracheal tube tip projects 2.3 cm above the carina. Nasogastric tube past the proximal stomach, distal tip not imaged. Multiple pacer pads and wires overlie the chest. The cardiac silhouette appears moderately enlarged, mediastinal silhouette is nonsuspicious. Diffuse interstitial prominence, RIGHT greater than LEFT with pulmonary vascular congestion. No pleural effusions though LEFT costophrenic angle is not imaged. No pneumothorax. Small metallic linear density projecting at RIGHT chest wall may be the external to the patient or, represent needle fragment. Recommend direct inspection. IMPRESSION: Endotracheal tube tip projects 2.3 cm above the carina. Nasogastric tube past proximal stomach. Increasing cardiomegaly and findings of asymmetric  interstitial pulmonary edema. Electronically Signed   By: Elon Alas M.D.   On: 11/29/2015 22:59   Dg Abd Portable 1v  11/26/2015  CLINICAL DATA:  70 year old male status post intubation and enteric tube placement. Respiratory distress. EXAM: PORTABLE ABDOMEN - 1 VIEW COMPARISON:  None. FINDINGS: An endotracheal tube is partially visualized extending into the left hemi abdomen with tip positioned in the left lower abdomen overlying the iliac crest. Normal caliber air-filled loops of small bowel noted. There is cardiomegaly. There is diffuse hazy density of the right lung base. A 2 cm linear radiopaque density over the right lung base may be superimposed on the patient. Clinical correlation is recommended.  IMPRESSION: Enteric tube the tip in the left lower abdomen superimposed over the iliac crest. Electronically Signed   By: Anner Crete M.D.   On: 11/23/2015 23:02     Medications:   . sodium chloride Stopped (11/07/15 0800)  . fentaNYL infusion INTRAVENOUS 50 mcg/hr (11/09/2015 1900)  . heparin 550 Units/hr (11/13/2015 1900)  . milrinone 0.25 mcg/kg/min (11/07/15 0756)  . norepinephrine (LEVOPHED) Adult infusion 3 mcg/min (11/07/15 0930)  .  sodium bicarbonate  infusion 1000 mL 50 mL/hr at 11/07/15 0136  . vasopressin (PITRESSIN) infusion - *FOR SHOCK* Stopped (11/07/15 0533)   . antiseptic oral rinse  7 mL Mouth Rinse 10 times per day  . aspirin  81 mg Per Tube Daily  . atorvastatin  80 mg Per Tube q1800  . chlorhexidine gluconate  15 mL Mouth Rinse BID  . insulin aspart  0-15 Units Subcutaneous 6 times per day  . [START ON 11/08/2015] oseltamivir  30 mg Oral Q48H  . pantoprazole (PROTONIX) IV  40 mg Intravenous QHS  . piperacillin-tazobactam (ZOSYN)  IV  2.25 g Intravenous 4 times per day  . sodium bicarbonate  50 mEq Intravenous Once  . sodium bicarbonate  50 mEq Intravenous Once  . sodium chloride  1,000 mL Intravenous Once  . sodium chloride flush  10-40 mL Intracatheter Q12H   . sodium chloride flush  3 mL Intravenous Q12H  . vancomycin  500 mg Intravenous Q24H   sodium chloride, Place/Maintain arterial line **AND** sodium chloride, albuterol, fentaNYL, midazolam, midazolam, sodium chloride flush  Assessment/ Plan:  1 AKI presumeably ischemic ATN. Has some CKD. Now nonoliguric and will see how does with that with solute, acid/base/K.Marland Kitchen Urine mild protein no active sediment  I think he is stable today no indications to start dialysis 2 CKD 3  Baseline creatinine 1.6  3 Hypertension: not an issue   Balloon pump  4. CM with low EF  10% 5. S/p multilple arrests follow MS and clinical parameter 6 Pulm infilt ?Flu vs other. On AB, antivirals 7 Prostate Ca 8ETOH abuse 10Enceph  Renal ultrasound decompressed bladder  10.3 and 10.8 cm kidney    LOS: 1 Ksenia Kunz W @TODAY @10 :37 AM

## 2015-11-07 NOTE — Progress Notes (Signed)
Bedside EEG completed, results pending. 

## 2015-11-07 NOTE — Progress Notes (Signed)
Patient ID: William Mccullough, male   DOB: 1945/10/02, 70 y.o.   MRN: [id number]   SUBJECTIVE: Patient remains intubated, sedated. Will not follow commands.   3/2: Respiratory arrest with intubation followed by PEA arrest x 2.  IABP placed.  Pressors begun.  CXR with RLL PNA. PCT > 175. Strep pneumo urine antigen+, flu -.    Troponin > 65   Echo: EF 15-20% with decreased RV function.   This morning, norepinephrine and vasopressin off.  IABP remains in placed augmenting 1:1.  BP stable but co-ox low at 46%.  CVP 11-12.  Platelets falling, now 86K.   Scheduled Meds: . antiseptic oral rinse  7 mL Mouth Rinse 10 times per day  . aspirin  81 mg Per Tube Daily  . atorvastatin  80 mg Per Tube q1800  . chlorhexidine gluconate  15 mL Mouth Rinse BID  . furosemide  80 mg Intravenous Once  . insulin aspart  0-15 Units Subcutaneous 6 times per day  . oseltamivir  30 mg Oral Daily  . pantoprazole (PROTONIX) IV  40 mg Intravenous QHS  . piperacillin-tazobactam (ZOSYN)  IV  2.25 g Intravenous 4 times per day  . sodium bicarbonate  50 mEq Intravenous Once  . sodium bicarbonate  50 mEq Intravenous Once  . sodium chloride  1,000 mL Intravenous Once  . sodium chloride flush  10-40 mL Intracatheter Q12H  . sodium chloride flush  3 mL Intravenous Q12H  . vancomycin  500 mg Intravenous Q24H   Continuous Infusions: . sodium chloride 5 mL/hr at 11/08/2015 1900  . fentaNYL infusion INTRAVENOUS 50 mcg/hr (11/18/2015 1900)  . heparin 550 Units/hr (12/04/2015 1900)  . milrinone    . norepinephrine (LEVOPHED) Adult infusion Stopped (11/07/15 0102)  .  sodium bicarbonate  infusion 1000 mL 50 mL/hr at 11/07/15 0136  . vasopressin (PITRESSIN) infusion - *FOR SHOCK* Stopped (11/07/15 0533)   PRN Meds:.sodium chloride, Place/Maintain arterial line **AND** sodium chloride, albuterol, fentaNYL, midazolam, midazolam, sodium chloride flush   Filed Vitals:   11/07/15 0615 11/07/15 0630 11/07/15 0645 11/07/15 0700  BP:     144/63  Pulse: 68 73 33 71  Temp: 98.8 F (37.1 C) 98.6 F (37 C) 98.4 F (36.9 C) 98.2 F (36.8 C)  TempSrc:      Resp: 22 22 22 22   Height:      Weight:      SpO2: 97% 99% 99% 100%    Intake/Output Summary (Last 24 hours) at 11/07/15 0736 Last data filed at 11/07/15 0700  Gross per 24 hour  Intake 4711.19 ml  Output   1905 ml  Net 2806.19 ml    LABS: Basic Metabolic Panel:  Recent Labs  11/09/2015 0245  11/23/2015 1833 11/07/15 0325  NA 143  < > 142 141  K 3.9  < > 3.5 3.8  CL 109  < > 102 99*  CO2 17*  < > 27 26  GLUCOSE 140*  < > 109* 98  BUN 14  < > 23* 28*  CREATININE 1.69*  < > 2.61* 2.92*  CALCIUM 8.5*  < > 8.0* 7.7*  MG 2.0  --   --   --   PHOS 5.3*  < > 2.7 2.8  < > = values in this interval not displayed. Liver Function Tests:  Recent Labs  11/26/2015 2255  11/20/2015 1833 11/07/15 0325  AST 166*  --   --  724*  ALT 52  --   --  510*  ALKPHOS 109  --   --  61  BILITOT 0.7  --   --  2.3*  PROT 5.4*  --   --  4.1*  ALBUMIN 2.8*  < > 2.0* 2.0*  < > = values in this interval not displayed. No results for input(s): LIPASE, AMYLASE in the last 72 hours. CBC:  Recent Labs  11/16/2015 2239  12/05/2015 0657 11/07/15 0325  WBC 20.9*  < > 7.5 13.6*  NEUTROABS 16.5*  --   --  12.1*  HGB 12.9*  < > 12.1* 9.8*  HCT 37.7*  < > 35.1* 28.6*  MCV 84.2  < > 84.0 78.8  PLT 175  < > 134* 86*  < > = values in this interval not displayed. Cardiac Enzymes:  Recent Labs  11/16/2015 0958 11/05/2015 1150 11/14/2015 1833  TROPONINI >65.00* >65.00* >65.00*   BNP: Invalid input(s): POCBNP D-Dimer: No results for input(s): DDIMER in the last 72 hours. Hemoglobin A1C:  Recent Labs  11/12/2015 0120  HGBA1C 6.2*   Fasting Lipid Panel: No results for input(s): CHOL, HDL, LDLCALC, TRIG, CHOLHDL, LDLDIRECT in the last 72 hours. Thyroid Function Tests:  Recent Labs  11/29/2015 0120  TSH 6.243*   Anemia Panel: No results for input(s): VITAMINB12, FOLATE, FERRITIN,  TIBC, IRON, RETICCTPCT in the last 72 hours.  RADIOLOGY: US Renal  11/11/2015  CLINICAL DATA:  Acute renal injury EXAM: RENAL / URINARY TRACT ULTRASOUND COMPLETE COMPARISON:  None. FINDINGS: Right Kidney: Length: 10.8 cm. Echogenicity within normal limits. No mass or hydronephrosis visualized. Left Kidney: Length: 10.3 cm. No hydronephrosis is noted. Calcifications are identified within the renal pelvis consistent with renal calculi. The largest of these measures 9 mm. Bladder: Decompressed by Foley catheter. IMPRESSION: Left renal calculi without acute abnormality. Electronically Signed   By: Inez Catalina M.D.   On: 12/05/2015 18:54   Dg Chest Port 1 View  11/07/2015  CLINICAL DATA:  Respiratory failure. EXAM: PORTABLE CHEST 1 VIEW COMPARISON:  11/11/2015. FINDINGS: Endotracheal tube, NG tube, left IJ line stable position. Balloon pump in stable position. Cardiomegaly with normal pulmonary vascularity. Right lower lobe infiltrate consistent pneumonia. No pleural effusion or pneumothorax . IMPRESSION: 1. Lines and tubes stable position. Balloon pump in stable position. 2. Persistent right lower lobe infiltrate consistent pneumonia. Similar findings noted on prior exam. 3. Cardiomegaly.  No pulmonary venous congestion . Electronically Signed   By: Marcello Moores  Register   On: 11/07/2015 07:14   Dg Chest Port 1 View  11/07/2015  CLINICAL DATA:  Evaluate intra-aortic balloon pump placement. EXAM: PORTABLE CHEST 1 VIEW COMPARISON:  11/15/2015 at 8:08 a.m. FINDINGS: The metallic portion of the intra-aortic balloon pump is superimposed upon the inferior margin of the aortic knob, well positioned. Endotracheal tube, left internal jugular central venous line and oral/ nasogastric tube are stable. There is improved right lung aeration when compared the prior study. There is significantly less airspace opacification. Residual airspace opacification is noted at the right base. IMPRESSION: 1. Metal tip of the intra-aortic  balloon pump projects along the inferior margin of the aortic knob. 2. Significant improvement in right lung aeration since the earlier exam. No new lung abnormalities. Electronically Signed   By: Lajean Manes M.D.   On: 11/07/2015 19:16   Dg Chest Port 1 View  11/26/2015  CLINICAL DATA:  Central line placement EXAM: PORTABLE CHEST 1 VIEW COMPARISON:  Chest x-ray from earlier same day and from 11/12/2015. FINDINGS: Heart size is stable. Overall cardiomediastinal silhouette is stable  in size and configuration. Endotracheal tube remains well positioned with tip approximately 3 cm above the level of the carina. Nasogastric tube passes below the diaphragm. New left IJ central line in place with tip adequately positioned over the mid SVC. No pneumothorax seen. Diffuse airspace opacities are again noted throughout the right lung. Again noted is a probable small right pleural effusion, unchanged. No new lung findings. IMPRESSION: 1. New left IJ central line appears adequately positioned with tip projected over the mid SVC. No pneumothorax. 2. Otherwise stable chest x-ray. Diffuse airspace opacities throughout the right lung, most prominent at the right lung base, unchanged in the short-term interval, most suggestive of pneumonia versus asymmetric pulmonary edema. Probable small right pleural effusion. Left lung remains clear. Electronically Signed   By: Franki Cabot M.D.   On: 11/05/2015 08:21   Dg Chest Port 1 View  12/03/2015  CLINICAL DATA:  Respiratory failure. EXAM: PORTABLE CHEST 1 VIEW COMPARISON:  11/25/2015. FINDINGS: Endotracheal tube, NG tube in stable position. Heart size stable. Stable cardiomegaly. Diffuse right lung infiltrate, all slight interim improvement. Mild atelectasis and/or infiltrate left lung base. Small right pleural effusion cannot be excluded. No pneumothorax . Previously identified tiny metallic density noted along the right lateral chest well no longer identified. IMPRESSION: 1. Lines  and tubes in stable position. 2. Diffuse right lung infiltrate, slight interim improvement. Mild left lower lobe atelectasis and/or infiltrate. Small right pleural effusion cannot be excluded. Electronically Signed   By: Marcello Moores  Register   On: 11/14/2015 07:16   Dg Chest Portable 1 View  12/04/2015  CLINICAL DATA:  Intubation. Respiratory distress. History of hypertension, prostate cancer. EXAM: PORTABLE CHEST 1 VIEW COMPARISON:  Chest radiograph September 08, 2011 FINDINGS: Endotracheal tube tip projects 2.3 cm above the carina. Nasogastric tube past the proximal stomach, distal tip not imaged. Multiple pacer pads and wires overlie the chest. The cardiac silhouette appears moderately enlarged, mediastinal silhouette is nonsuspicious. Diffuse interstitial prominence, RIGHT greater than LEFT with pulmonary vascular congestion. No pleural effusions though LEFT costophrenic angle is not imaged. No pneumothorax. Small metallic linear density projecting at RIGHT chest wall may be the external to the patient or, represent needle fragment. Recommend direct inspection. IMPRESSION: Endotracheal tube tip projects 2.3 cm above the carina. Nasogastric tube past proximal stomach. Increasing cardiomegaly and findings of asymmetric interstitial pulmonary edema. Electronically Signed   By: Elon Alas M.D.   On: 11/16/2015 22:59   Dg Abd Portable 1v  12/04/2015  CLINICAL DATA:  70 year old male status post intubation and enteric tube placement. Respiratory distress. EXAM: PORTABLE ABDOMEN - 1 VIEW COMPARISON:  None. FINDINGS: An endotracheal tube is partially visualized extending into the left hemi abdomen with tip positioned in the left lower abdomen overlying the iliac crest. Normal caliber air-filled loops of small bowel noted. There is cardiomegaly. There is diffuse hazy density of the right lung base. A 2 cm linear radiopaque density over the right lung base may be superimposed on the patient. Clinical correlation is  recommended. IMPRESSION: Enteric tube the tip in the left lower abdomen superimposed over the iliac crest. Electronically Signed   By: Anner Crete M.D.   On: 11/30/2015 23:02    PHYSICAL EXAM General: Intubated, sedated.  Neck: Dilated IJ, no thyromegaly or thyroid nodule.  Lungs: Coarse BS bilaterally. CV: Lateral PMI.  Heart regular S1/S2, no S3/S4, IABP sounds.  No peripheral edema.   Abdomen: Soft, no hepatosplenomegaly, no distention.  Neurologic: Not following commands, gaze to upper left.  Extremities: No clubbing or cyanosis.   TELEMETRY: Reviewed telemetry pt in NSR  ASSESSMENT AND PLAN: 70 yo with history of prior prostate cancer treated with radiation, known CAD, and active smoking was found by friends on 3/2 to be in respiratory distress. To ER, intubated, NSTEMI with troponin >65. He had CPR for PEA x 2, IABP placed.  Suspect co-existing severe PNA and NSTEMI.  1. ID: RLL PNA on CXR, PCT > 175, Strep pneumo urine antigen positive.  I think that he probably has PNA co-existing with MI.   - Broad spectrum abx per CCM.  2. Cardiogenic (+/- septic) shock: Echo with EF 15-20%.  On pressors yesterday and IABP placed. Now weaned off pressors with IABP remaining in place at 1:1. Co-ox low this morning at 46% and urine output tapering off.  BP stable, CVP 11-12.   - Start milrinone 0.25 mcg/kg/min.   - After milrinone started, will give Lasix 80 mg IV x 1 and follow response.  - Continue IABP support for now.  He is on heparin gtt for IABP.  Fall in heparin concerning, likely triggered by IABP.  3. CAD: Suspect NSTEMI with TnI now > 65 and low EF.  No cath/intervention yet.  - Will need angiography when more stable.  - Continue ASA 81, statin.  On heparin with IABP.  4. AKI: Suspect triggered by shock.  Renal following, may end up needing CVVH.  5. Neuro: Now s/p PEA arrest with CPR x 2.  Not following commands and gazes left.  Suspect will need neuro evaluation.  6. Heme: Fall  in plts and hemoglobin likely related to IABP (+/- effect from sepsis). Doubt HIT.  Will follow, he is currently dependent on IABP. 7. Elevated LFTs: Shock liver.   45 minutes critical care time.   Loralie Champagne 11/07/2015 7:49 AM

## 2015-11-07 NOTE — Progress Notes (Signed)
[facility] Progress Note Patient Name: William Mccullough DOB: 1946-09-06 MRN: [id number]   Date of Service  11/07/2015  HPI/Events of Note  CVP remains 5, norepi up to 18  eICU Interventions  Restart vasopressin Another 500cc bolus, then will probably defer further fluid given septic CM     Intervention Category Major Interventions: Shock - evaluation and management  Kennisha Qin S. 11/07/2015, 7:32 PM

## 2015-11-07 NOTE — Progress Notes (Signed)
CRITICAL VALUE ALERT  Critical value received:  Lactic 2.7  Date of notification:  11/07/2015    Time of notification:  1250  Critical value read back:Yes.    Nurse who received alert:  [place]   MD notified (1st page):  Titus Mould  Time of first page:  1300  MD notified (2nd page):  Time of second page:  Responding MD:  Titus Mould  Time MD responded:  1300

## 2015-11-07 NOTE — Progress Notes (Signed)
Pts HR trending up, BP trending down, CVP 5, CCM made aware orders received.

## 2015-11-07 NOTE — Progress Notes (Signed)
CRITICAL VALUE ALERT  Critical value received:  Lactic Acid   Date of notification:  11/07/15  Time of notification:  1620   Critical value read back:Yes.    Nurse who received alert:  [place]  CCM made aware.

## 2015-11-07 NOTE — Progress Notes (Signed)
ANTICOAGULATION CONSULT NOTE - Follow Up Consult  Pharmacy Consult for Heparin  Indication: IABP  No Known Allergies  Patient Measurements: Height: 5\' 9"  (175.3 cm) Weight: 107 lb 5.8 oz (48.7 kg) IBW/kg (Calculated) : 70.7 Vital Signs: Temp: 98.8 F (37.1 C) (03/03 0300) Temp Source: Core (Comment) (03/03 0000) BP: 141/53 mmHg (03/03 0300) Pulse Rate: 68 (03/03 0300)  Labs:  Recent Labs  11/21/2015 2255  11/18/2015 0245 11/13/2015 0657 11/05/2015 0958 12/01/2015 1150 11/16/2015 1437 11/11/2015 1832 11/14/2015 1833 11/07/15 0325  HGB  --   --  13.5 12.1*  --   --   --   --   --  9.8*  HCT  --   --  41.1 35.1*  --   --   --   --   --  28.6*  PLT  --   --  177 134*  --   --   --   --   --  PENDING  LABPROT 15.6*  --   --   --   --   --   --   --   --   --   INR 1.22  --   --   --   --   --   --   --   --   --   HEPARINUNFRC  --   --   --   --   --   --   --  0.25*  --  0.38  CREATININE 1.90*  --  1.69* 2.11*  --   --  2.41*  --  2.61*  --   TROPONINI  --   < > 10.60* 31.57* >65.00* >65.00*  --   --  >65.00*  --   < > = values in this interval not displayed.   Assessment: Pt s/p IABP placement on heparin, heparin level is now therapeutic x 2 (goal 0.2-0.5)  Goal of Therapy:  Heparin level 0.2-0.5 units/ml Monitor platelets by anticoagulation protocol: Yes   Plan:  -Continue heparin at 550 units/hr -Daily CBC/HL -Monitor for bleeding  [name] 11/07/2015,4:01 AM

## 2015-11-08 ENCOUNTER — Inpatient Hospital Stay (HOSPITAL_COMMUNITY): Payer: Medicare Other

## 2015-11-08 LAB — GLUCOSE, CAPILLARY
GLUCOSE-CAPILLARY: 158 mg/dL — AB (ref 65–99)
GLUCOSE-CAPILLARY: 53 mg/dL — AB (ref 65–99)
GLUCOSE-CAPILLARY: 100 mg/dL — AB (ref 65–99)
GLUCOSE-CAPILLARY: 106 mg/dL — AB (ref 65–99)
GLUCOSE-CAPILLARY: 117 mg/dL — AB (ref 65–99)
Glucose-Capillary: 142 mg/dL — ABNORMAL HIGH (ref 65–99)
Glucose-Capillary: 52 mg/dL — ABNORMAL LOW (ref 65–99)
Glucose-Capillary: 128 mg/dL — ABNORMAL HIGH (ref 65–99)

## 2015-11-08 LAB — COMPREHENSIVE METABOLIC PANEL
ALBUMIN: 2.2 g/dL — AB (ref 3.5–5.0)
ALT: 414 U/L — ABNORMAL HIGH (ref 17–63)
ANION GAP: 14 (ref 5–15)
AST: 307 U/L — ABNORMAL HIGH (ref 15–41)
Alkaline Phosphatase: 65 U/L (ref 38–126)
BUN: 43 mg/dL — ABNORMAL HIGH (ref 6–20)
CO2: 28 mmol/L (ref 22–32)
Calcium: 7.5 mg/dL — ABNORMAL LOW (ref 8.9–10.3)
Chloride: 98 mmol/L — ABNORMAL LOW (ref 101–111)
Creatinine, Ser: 3.61 mg/dL — ABNORMAL HIGH (ref 0.61–1.24)
GFR calc Af Amer: 19 mL/min — ABNORMAL LOW (ref >60)
GFR calc non Af Amer: 16 mL/min — ABNORMAL LOW (ref >60)
Glucose, Bld: 148 mg/dL — ABNORMAL HIGH (ref 65–99)
POTASSIUM: 4 mmol/L (ref 3.5–5.1)
SODIUM: 140 mmol/L (ref 135–145)
Total Bilirubin: 1.2 mg/dL (ref 0.3–1.2)
Total Protein: 4.6 g/dL — ABNORMAL LOW (ref 6.5–8.1)

## 2015-11-08 LAB — POCT I-STAT 3, ART BLOOD GAS (G3+)
ACID-BASE EXCESS: 7 mmol/L — AB (ref 0.0–2.0)
Acid-Base Excess: 6 mmol/L — ABNORMAL HIGH (ref 0.0–2.0)
BICARBONATE: 33 meq/L — AB (ref 20.0–24.0)
BICARBONATE: 33.5 meq/L — AB (ref 20.0–24.0)
O2 SAT: 78 %
O2 SAT: 97 %
PH ART: 7.38 (ref 7.350–7.450)
PO2 ART: 43 mmHg — AB (ref 80.0–100.0)
TCO2: 35 mmol/L (ref 0–100)
TCO2: 35 mmol/L (ref 0–100)
pCO2 arterial: 25.4 mmHg — ABNORMAL LOW (ref 35.0–45.0)
pCO2 arterial: 56.1 mmHg — ABNORMAL HIGH (ref 35.0–45.0)
pH, Arterial: 7.63 (ref 7.350–7.450)
pO2, Arterial: 32 mmHg — CL (ref 80.0–100.0)

## 2015-11-08 LAB — BLOOD GAS, ARTERIAL
ACID-BASE EXCESS: 6.9 mmol/L — AB (ref 0.0–2.0)
Bicarbonate: 31.3 mEq/L — ABNORMAL HIGH (ref 20.0–24.0)
FIO2: 0.4
O2 SAT: 96.6 %
PATIENT TEMPERATURE: 98.6
PEEP: 5 cmH2O
PH ART: 7.424 (ref 7.350–7.450)
RATE: 10 resp/min
TCO2: 32.8 mmol/L (ref 0–100)
VT: 500 mL
pCO2 arterial: 48.8 mmHg — ABNORMAL HIGH (ref 35.0–45.0)
pO2, Arterial: 89.3 mmHg (ref 80.0–100.0)

## 2015-11-08 LAB — CARBOXYHEMOGLOBIN
CARBOXYHEMOGLOBIN: 1.1 % (ref 0.5–1.5)
Carboxyhemoglobin: 1.3 % (ref 0.5–1.5)
Methemoglobin: 0.9 % (ref 0.0–1.5)
Methemoglobin: 0.9 % (ref 0.0–1.5)
O2 SAT: 71.8 %
O2 Saturation: 79.5 %
Total hemoglobin: 9.1 g/dL — ABNORMAL LOW (ref 13.5–18.0)
Total hemoglobin: 8.9 g/dL — ABNORMAL LOW (ref 13.5–18.0)

## 2015-11-08 LAB — CBC
HEMATOCRIT: 26.1 % — AB (ref 39.0–52.0)
Hemoglobin: 9.1 g/dL — ABNORMAL LOW (ref 13.0–17.0)
MCH: 27.7 pg (ref 26.0–34.0)
MCHC: 34.9 g/dL (ref 30.0–36.0)
MCV: 79.3 fL (ref 78.0–100.0)
Platelets: 77 10*3/uL — ABNORMAL LOW (ref 150–400)
RBC: 3.29 MIL/uL — AB (ref 4.22–5.81)
RDW: 13.9 % (ref 11.5–15.5)
WBC: 18.4 10*3/uL — AB (ref 4.0–10.5)

## 2015-11-08 LAB — POCT ACTIVATED CLOTTING TIME
ACTIVATED CLOTTING TIME: 162 s
Activated Clotting Time: 167 seconds

## 2015-11-08 LAB — CORTISOL: Cortisol, Plasma: 15.1 ug/dL

## 2015-11-08 LAB — HEPARIN LEVEL (UNFRACTIONATED): Heparin Unfractionated: 0.23 IU/mL — ABNORMAL LOW (ref 0.30–0.70)

## 2015-11-08 LAB — PROCALCITONIN: Procalcitonin: 149.51 ng/mL

## 2015-11-08 LAB — PROTIME-INR
INR: 1.39 (ref 0.00–1.49)
PROTHROMBIN TIME: 17.1 s — AB (ref 11.6–15.2)

## 2015-11-08 LAB — FIBRINOGEN: Fibrinogen: 666 mg/dL — ABNORMAL HIGH (ref 204–475)

## 2015-11-08 MED ORDER — HYDRALAZINE HCL 20 MG/ML IJ SOLN
10.0000 mg | INTRAMUSCULAR | Status: DC | PRN
  Administered 2015-11-08 – 2015-11-09 (×2): 10 mg via INTRAVENOUS
  Administered 2015-11-10 – 2015-11-16 (×2): 20 mg via INTRAVENOUS
  Filled 2015-11-08 (×5): qty 1

## 2015-11-08 MED ORDER — ATROPINE SULFATE 0.1 MG/ML IJ SOLN
INTRAMUSCULAR | Status: DC
Start: 2015-11-08 — End: 2015-11-08
  Filled 2015-11-08: qty 10

## 2015-11-08 MED ORDER — DEXTROSE 50 % IV SOLN
INTRAVENOUS | Status: AC
  Administered 2015-11-08: 50 mL
  Filled 2015-11-08: qty 50

## 2015-11-08 NOTE — Progress Notes (Signed)
Pt. BP continuing to trend down despite increases in levo. CCM made aware and orders received.

## 2015-11-08 NOTE — Progress Notes (Signed)
  IABP site bleeding/oozing. Pressure held x5 mins. Dressing changed and no longer bleeding or oozing. Will continue to monitor closely.

## 2015-11-08 NOTE — Progress Notes (Signed)
[facility] ROUNDING NOTE   Subjective:   Interval History:  Still making urine  Intubated  waking  Objective:  Vital signs in last 24 hours:  Temp:  [96.8 F (36 C)-97.7 F (36.5 C)] 97 F (36.1 C) (03/04 1100) Pulse Rate:  [66-97] 83 (03/04 1100) Resp:  [9-26] 18 (03/04 1100) BP: (87-147)/(15-70) 133/64 mmHg (03/04 1136) SpO2:  [96 %-100 %] 97 % (03/04 1136) Arterial Line BP: (84-234)/(39-112) 141/64 mmHg (03/04 1100) FiO2 (%):  [40 %-50 %] 40 % (03/04 1136) Weight:  [54.7 kg (120 lb 9.5 oz)] 54.7 kg (120 lb 9.5 oz) (03/04 0434)  Weight change: -0.4 kg (-14.1 oz) Filed Weights   11/11/2015 0000 11/07/15 0500 11/08/15 0434  Weight: 48.7 kg (107 lb 5.8 oz) 55.1 kg (121 lb 7.6 oz) 54.7 kg (120 lb 9.5 oz)    Intake/Output: I/O last 3 completed shifts: In: 5343.2 [I.V.:3065.8; NG/GT:477.3; IV Piggyback:1800] Out: 2758 [Urine:2758]   Intake/Output this shift:  Total I/O In: 560.8 [I.V.:195.8; NG/GT:315; IV Piggyback:50] Out: 35 [Urine:385]  CVS- RRR RS- CTA  Intubated  ABD- BS present soft non-distended EXT- no edema   Basic Metabolic Panel:  Recent Labs Lab 12/01/2015 0245  12/04/2015 1437 11/18/2015 1833 11/07/15 0325 11/07/15 1649 11/08/15 0357  NA 143  < > 144 142 141 143 140  K 3.9  < > 3.4* 3.5 3.8 3.8 4.0  CL 109  < > 105 102 99* 97* 98*  CO2 17*  < > 28 27 26 30 28   GLUCOSE 140*  < > 130* 109* 98 112* 148*  BUN 14  < > 21* 23* 28* 36* 43*  CREATININE 1.69*  < > 2.41* 2.61* 2.92* 3.54* 3.61*  CALCIUM 8.5*  < > 8.2* 8.0* 7.7* 7.6* 7.5*  MG 2.0  --   --   --   --  1.6*  --   PHOS 5.3*  --  3.0 2.7 2.8  --   --   < > = values in this interval not displayed.  Liver Function Tests:  Recent Labs Lab 11/11/2015 2255 11/08/2015 1437 11/18/2015 1833 11/07/15 0325 11/08/15 0357  AST 166*  --   --  724* 307*  ALT 52  --   --  510* 414*  ALKPHOS 109  --   --  61 65  BILITOT 0.7  --   --  2.3* 1.2  PROT 5.4*  --   --  4.1* 4.6*  ALBUMIN 2.8* 2.0*  2.0* 2.0* 2.2*   No results for input(s): LIPASE, AMYLASE in the last 168 hours. No results for input(s): AMMONIA in the last 168 hours.  CBC:  Recent Labs Lab 11/11/2015 2239 11/13/2015 0245 11/18/2015 0657 11/07/15 0325 11/08/15 0357  WBC 20.9* 10.6* 7.5 13.6* 18.4*  NEUTROABS 16.5*  --   --  12.1*  --   HGB 12.9* 13.5 12.1* 9.8* 9.1*  HCT 37.7* 41.1 35.1* 28.6* 26.1*  MCV 84.2 84.0 84.0 78.8 79.3  PLT 175 177 134* 86* 77*    Cardiac Enzymes:  Recent Labs Lab 11/13/2015 0245 11/11/2015 0657 11/29/2015 0958 11/17/2015 1150 11/05/2015 1833  TROPONINI 10.60* 31.57* >65.00* >65.00* >65.00*    BNP: Invalid input(s): POCBNP  CBG:  Recent Labs Lab 11/07/15 2322 11/08/15 0338 11/08/15 0829 11/08/15 0830 11/08/15 0857  GLUCAP 161* 142* 32* 54* 158*    Microbiology: Results for orders placed or performed during the hospital encounter of 11/12/2015  Blood culture (routine x 2)     Status:  None (Preliminary result)   Collection Time: 11/21/2015 10:40 PM  Result Value Ref Range Status   Specimen Description BLOOD RIGHT ARM  Final   Special Requests BOTTLES DRAWN AEROBIC AND ANAEROBIC 5ML  Final   Culture NO GROWTH 3 DAYS  Final   Report Status PENDING  Incomplete  Blood culture (routine x 2)     Status: None (Preliminary result)   Collection Time: 11/12/2015 10:55 PM  Result Value Ref Range Status   Specimen Description BLOOD RIGHT HAND  Final   Special Requests IN PEDIATRIC BOTTLE 1ML  Final   Culture NO GROWTH 3 DAYS  Final   Report Status PENDING  Incomplete  MRSA PCR Screening     Status: None   Collection Time: 11/28/2015 12:54 AM  Result Value Ref Range Status   MRSA by PCR NEGATIVE NEGATIVE Final    Comment:        The GeneXpert MRSA Assay (FDA approved for NASAL specimens only), is one component of a comprehensive MRSA colonization surveillance program. It is not intended to diagnose MRSA infection nor to guide or monitor treatment for MRSA infections.    Respiratory virus panel     Status: None   Collection Time: 11/17/2015  3:39 AM  Result Value Ref Range Status   Respiratory Syncytial Virus A Negative Negative Final   Respiratory Syncytial Virus B Negative Negative Final   Influenza A Negative Negative Final   Influenza B Negative Negative Final   Parainfluenza 1 Negative Negative Final   Parainfluenza 2 Negative Negative Final   Parainfluenza 3 Negative Negative Final   Metapneumovirus Negative Negative Final   Rhinovirus Negative Negative Final   Adenovirus Negative Negative Final    Comment: (NOTE) Performed At: [facility] [place], [place] [id number] Lindon Romp MD [id number]   Urine culture     Status: None   Collection Time: 11/10/2015  3:53 AM  Result Value Ref Range Status   Specimen Description URINE, RANDOM  Final   Special Requests NONE  Final   Culture NO GROWTH 1 DAY  Final   Report Status 11/07/2015 FINAL  Final  Culture, respiratory (NON-Expectorated)     Status: None (Preliminary result)   Collection Time: 11/29/2015  6:00 AM  Result Value Ref Range Status   Specimen Description TRACHEAL ASPIRATE  Final   Special Requests NONE  Final   Gram Stain   Final    FEW WBC PRESENT,BOTH PMN AND MONONUCLEAR NO SQUAMOUS EPITHELIAL CELLS SEEN NO ORGANISMS SEEN Performed at [facility]    Culture   Final    NO GROWTH 1 DAY Performed at [facility]    Report Status PENDING  Incomplete    Coagulation Studies:  Recent Labs  11/14/2015 2255 11/07/15 1225 11/08/15 0800  LABPROT 15.6* 26.6* 17.1*  INR 1.22 2.49* 1.39    Urinalysis:  Recent Labs  11/12/2015 0353  COLORURINE YELLOW  LABSPEC 1.010  PHURINE 5.0  GLUCOSEU NEGATIVE  HGBUR LARGE*  BILIRUBINUR NEGATIVE  KETONESUR NEGATIVE  PROTEINUR 30*  NITRITE NEGATIVE  LEUKOCYTESUR NEGATIVE      Imaging: US Renal  11/30/2015  CLINICAL DATA:  Acute renal injury EXAM: RENAL / URINARY TRACT ULTRASOUND COMPLETE  COMPARISON:  None. FINDINGS: Right Kidney: Length: 10.8 cm. Echogenicity within normal limits. No mass or hydronephrosis visualized. Left Kidney: Length: 10.3 cm. No hydronephrosis is noted. Calcifications are identified within the renal pelvis consistent with renal calculi. The largest of these measures 9 mm.  Bladder: Decompressed by Foley catheter. IMPRESSION: Left renal calculi without acute abnormality. Electronically Signed   By: Inez Catalina M.D.   On: 11/24/2015 18:54   Dg Chest Port 1 View  11/08/2015  CLINICAL DATA:  Pneumonia EXAM: PORTABLE CHEST 1 VIEW COMPARISON:  November 07, 2015 FINDINGS: The ET and NG tubes are again identified. A left central line is stable. A transcutaneous pacemaker pad is identified. An intra-aortic balloon pump is again identified with the distal tip 3.7 cm below the top of the aortic arch. This has probably been pulled back slightly in the interval. Stable cardiomegaly. No pulmonary nodules or masses. The focal opacity in the medial right lung base is improved but not completely resolve. Mild edema is suspected as well. IMPRESSION: The support apparatus is in good position. Improving focal opacity in the right infrahilar region. Mild edema. Electronically Signed   By: Dorise Bullion III M.D   On: 11/08/2015 08:03   Dg Chest Port 1 View  11/07/2015  CLINICAL DATA:  Respiratory failure. EXAM: PORTABLE CHEST 1 VIEW COMPARISON:  11/24/2015. FINDINGS: Endotracheal tube, NG tube, left IJ line stable position. Balloon pump in stable position. Cardiomegaly with normal pulmonary vascularity. Right lower lobe infiltrate consistent pneumonia. No pleural effusion or pneumothorax . IMPRESSION: 1. Lines and tubes stable position. Balloon pump in stable position. 2. Persistent right lower lobe infiltrate consistent pneumonia. Similar findings noted on prior exam. 3. Cardiomegaly.  No pulmonary venous congestion . Electronically Signed   By: Marcello Moores  Register   On: 11/07/2015 07:14   Dg  Chest Port 1 View  11/12/2015  CLINICAL DATA:  Evaluate intra-aortic balloon pump placement. EXAM: PORTABLE CHEST 1 VIEW COMPARISON:  12/03/2015 at 8:08 a.m. FINDINGS: The metallic portion of the intra-aortic balloon pump is superimposed upon the inferior margin of the aortic knob, well positioned. Endotracheal tube, left internal jugular central venous line and oral/ nasogastric tube are stable. There is improved right lung aeration when compared the prior study. There is significantly less airspace opacification. Residual airspace opacification is noted at the right base. IMPRESSION: 1. Metal tip of the intra-aortic balloon pump projects along the inferior margin of the aortic knob. 2. Significant improvement in right lung aeration since the earlier exam. No new lung abnormalities. Electronically Signed   By: Lajean Manes M.D.   On: 11/15/2015 19:16   Ct Portable Head W/o Cm  11/07/2015  CLINICAL DATA:  Altered mental status EXAM: CT HEAD WITHOUT CONTRAST TECHNIQUE: Contiguous paraaxial images were obtained from the base of the skull through the vertex without intravenous contrast. COMPARISON:  None. FINDINGS: There is mild diffuse atrophy. There is no intracranial mass, hemorrhage, extra-axial fluid collection, or midline shift. There are lacunar type infarcts in the centra semiovale bilaterally. There is patchy small vessel disease in the centra semiovale bilaterally. There is no demonstrable acute infarct. The bony calvarium appears intact. The mastoid air cells are clear. No intraorbital lesions are evident. 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 No acute infarct evident. No hemorrhage or mass effect. Electronically Signed   By: Lowella Grip III M.D.   On: 11/07/2015 13:40     Medications:   . sodium chloride Stopped (11/07/15 0800)  . sodium chloride 10 mL/hr at 11/08/15 1100  . feeding supplement (VITAL  AF 1.2 CAL) 1,000 mL (11/08/15 1100)  . fentaNYL infusion INTRAVENOUS 150 mcg/hr (11/08/15 1100)  . heparin 550 Units/hr (11/08/15 1100)  . milrinone  0.25 mcg/kg/min (11/08/15 1100)  . norepinephrine (LEVOPHED) Adult infusion Stopped (11/08/15 0256)  . vasopressin (PITRESSIN) infusion - *FOR SHOCK* Stopped (11/08/15 0401)   . antiseptic oral rinse  7 mL Mouth Rinse 10 times per day  . cefTRIAXone (ROCEPHIN)  IV  2 g Intravenous Q12H  . chlorhexidine gluconate  15 mL Mouth Rinse BID  . insulin aspart  0-15 Units Subcutaneous 6 times per day  . pantoprazole (PROTONIX) IV  40 mg Intravenous QHS  . sodium bicarbonate  50 mEq Intravenous Once  . sodium bicarbonate  50 mEq Intravenous Once  . sodium chloride  1,000 mL Intravenous Once  . sodium chloride flush  10-40 mL Intracatheter Q12H  . sodium chloride flush  3 mL Intravenous Q12H   sodium chloride, Place/Maintain arterial line **AND** sodium chloride, albuterol, fentaNYL, midazolam, midazolam, sodium chloride flush  Assessment/ Plan:  1 AKI presumeably ischemic ATN. Has some CKD. Now nonoliguric slow rise of creatinine Urine mild protein no active sediment I think he is stable today no indications to start dialysis 2 CKD 3 Baseline creatinine 1.6  3 Hypertension: not an issue Balloon pump  4. CM with low EF 10% 5. S/p multilple arrests follow MS and clinical parameter 6 Pulm infilt ?Flu vs other. On AB, antivirals 7 Prostate Ca 8ETOH abuse 10Enceph  Renal ultrasound decompressed bladder 10.3 and 10.8 cm kidney     LOS: 2 Adesuwa Osgood W @TODAY @12 :03 PM

## 2015-11-08 NOTE — Progress Notes (Signed)
Pt. RR 40; systolic BP 123456; HR 0000000; moving arms with stimulation. Spoke with e-link MD and increased sedation. Will continue to monitor closely.

## 2015-11-08 NOTE — H&P (Signed)
PULMONARY / CRITICAL CARE MEDICINE   Name: William Mccullough MRN: [id number] DOB: [birth year]    ADMISSION DATE:  11/09/2015 CONSULTATION DATE:  11/09/2015  REFERRING MD:  EDP  CHIEF COMPLAINT:  SOB  HISTORY OF PRESENT ILLNESS:  Pt is encephelopathic; therefore, this HPI is obtained from chart review. William Mccullough is a 70 y.o. male with PMH as outlined below. FOund down, MODS, cardiogenic shock.  SUBJECTIVE:  2 am off pressors, fluids given  VITAL SIGNS: BP 121/61 mmHg  Pulse 79  Temp(Src) 96.8 F (36 C) (Core (Comment))  Resp 10  Ht _0  (1.753 m)  Wt 54.7 kg (120 lb 9.5 oz)  BMI 17.80 kg/m2  SpO2 99%  HEMODYNAMICS: CVP:  [5 mmHg-10 mmHg] 8 mmHg  VENTILATOR SETTINGS: Vent Mode:  [-] PRVC FiO2 (%):  [50 %] 50 % Set Rate:  [10 bmp-22 bmp] 10 bmp Vt Set:  [500 mL-570 mL] 500 mL PEEP:  [8 cmH20-10 cmH20] 8 cmH20 Plateau Pressure:  [16 cmH20-26 cmH20] 16 cmH20  INTAKE / OUTPUT: I/O last 3 completed shifts: In: 5186.6 [I.V.:2944.2; NG/GT:442.3; IV Piggyback:1800] Out: 2718 [Urine:2718]   PHYSICAL EXAMINATION: General: AA male, thin, cachectic Neuro: rass -1, opens eyes, deviated left eye, not following commands, does localize HEENT: [place]/AT. PERR Cardiovascular: RRR, no M/R/G.  Lungs: reduced Abdomen: BS x 4, soft, NT/ND.  Musculoskeletal: No gross deformities, no edema.  Skin: Intact, warm, no rashes.  LABS:  BMET  Recent Labs Lab 11/07/15 0325 11/07/15 1649 11/08/15 0357  NA 141 143 140  K 3.8 3.8 4.0  CL 99* 97* 98*  CO2 _1 BUN 28* 36* 43*  CREATININE 2.92* 3.54* 3.61*  GLUCOSE 98 112* 148*    Electrolytes  Recent Labs Lab 11/09/2015 0245  11/20/2015 1437 11/15/2015 1833 11/07/15 0325 11/07/15 1649 11/08/15 0357  CALCIUM 8.5*  < > 8.2* 8.0* 7.7* 7.6* 7.5*  MG 2.0  --   --   --   --  1.6*  --   PHOS 5.3*  --  3.0 2.7 2.8  --   --   < > = values in this interval not displayed.  CBC  Recent Labs Lab 11/18/2015 0657 11/07/15 0325  11/08/15 0357  WBC 7.5 13.6* 18.4*  HGB 12.1* 9.8* 9.1*  HCT 35.1* 28.6* 26.1*  PLT 134* 86* 77*    Coag's  Recent Labs Lab 11/23/2015 2255 11/07/15 1225  APTT  --  >200*  INR 1.22 2.49*    Sepsis Markers  Recent Labs Lab 12/04/2015 0120 12/04/2015 0245 11/07/15 0325 11/07/15 1225 11/07/15 1600 11/08/15 0357  LATICACIDVEN 6.5* 7.5*  --  2.7* 2.6*  --   PROCALCITON 1.66  --  >175.00  --   --  149.51    ABG  Recent Labs Lab 11/18/2015 1619 11/07/15 0944 11/07/15 1121  PHART 7.495* 7.633* 7.453*  PCO2ART 35.9 28.0* 47.1*  PO2ART 80.0 77.0* 85.0    Liver Enzymes  Recent Labs Lab 11/30/2015 2255  11/13/2015 1833 11/07/15 0325 11/08/15 0357  AST 166*  --   --  724* 307*  ALT 52  --   --  510* 414*  ALKPHOS 109  --   --  61 65  BILITOT 0.7  --   --  2.3* 1.2  ALBUMIN 2.8*  < > 2.0* 2.0* 2.2*  < > = values in this interval not displayed.  Cardiac Enzymes  Recent Labs Lab 12/01/2015 0958 11/08/2015 1150 11/11/2015 1833  TROPONINI >65.00* >65.00* >65.00*  Glucose  Recent Labs Lab 11/07/15 0333 11/07/15 0833 11/07/15 1239 11/07/15 1553 11/07/15 1949 11/07/15 2322  GLUCAP 88 104* 126* 113* 120* 161*    Imaging Ct Portable Head W/o Cm  11/07/2015  CLINICAL DATA:  Altered mental status EXAM: CT HEAD WITHOUT CONTRAST TECHNIQUE: Contiguous paraaxial images were obtained from the base of the skull through the vertex without intravenous contrast. COMPARISON:  None. FINDINGS: There is mild diffuse atrophy. There is no intracranial mass, hemorrhage, extra-axial fluid collection, or midline shift. There are lacunar type infarcts in the centra semiovale bilaterally. There is patchy small vessel disease in the centra semiovale bilaterally. There is no demonstrable acute infarct. The bony calvarium appears intact. The mastoid air cells are clear. No intraorbital lesions are evident. IMPRESSION: Atrophy with prior small basal ganglia infarcts bilaterally as well as patchy  periventricular small vessel disease. No acute infarct evident. No hemorrhage or mass effect. Electronically Signed   By: Lowella Grip III M.D.   On: 11/07/2015 13:40   STUDIES:  CXR 03/01 > cardiomegaly with asymmetric interstitial edema right side. Echo pa 33, EF global 15% Cath neg  CULTURES: Blood 03/01 >>> Urine 03/01 >>> Sputum 03/01 >>> Leg>>>neg Strep>>>POS  ANTIBIOTICS: Vanc 03/02 >>> Zosyn 03/02 >>>3/3 cefriaxone 3/3>>>  SIGNIFICANT EVENTS: 03/02 > admitted with acute hypoxic respiratory failure requiring intubation.  Had brief code post intubation. 3/2- balloon pump, renal failure 3/4 - off pressors  LINES/TUBES: ETT 03/02 > Left IJ 3/2>>> Aline 3/2>>>   ASSESSMENT / PLAN:  PULMONARY A: Acute hypoxic respiratory failure - required intubation in ED. PNA-CAP P:   Repeat abg this am  SBT planned, cpap5 ps5, goal 2 hrs pcxr is resolving on rt,no CT needed, does not appear to be empyema  CARDIOVASCULAR A:  Septic shock, septic cardiomyoapthy Brief cardiac arrest post intubation - ROSC after 1 - 2 min Troponin leak - suspect due to demand ischemia. Hx HTN, cardiomyopathy (last echo from 2013 with EF 40-45%, grade 1DD.  Of note, per EDP, bedside cardiac POCUS revealed very poor EF estimated at 10-15%). P:  Balloon pump, consider weaning with off pressors status mAP goal met 60 cvp accuracy?, 9 now but despite 2 liters went down overnight Cortisol as went back on pressors  RENAL A:   AKI, ATN AGMA - lactate.  P:   KVo status, cvp 10 Chem in am  Support MAP Seeing a plat of crt,follow trend  GASTROINTESTINAL A:   GI prophylaxis. Nutrition. Shock liver P:   SUP: Pantoprazole. NPO TF tolerated  HEMATOLOGIC / ONCOLOGIC A:   VTE Prophylaxis. Hx prostate CA - last seen in 2013 and was doing well at that time. Thrombocytopenia *(sepsis) R/o DIC component coagulapathy P:  SCD's Can keep Heparin for balloon pump for now CBC in  AM Repeat coags now with some oozing noted Repeat fibrinogen If bleeding increased would consider cryo( fibrinogen will rise with SIRS)  INFECTIOUS A:   Septic shock, PAN, strep pos ag P:   Consider dc vanc  Maintain cefriaxone  ENDOCRINE A:   Hyperglycemia - no hx DM. P:   SSI Get cortisol  NEUROLOGIC A:   Acute metabolic encephalopathy. Hx anxiety, depression. R/o subclinical seziures At risk meningitis? P:   Sedation:  Fentanyl gtt / Midazolam PRN. RASS goal: 0 to -1. Daily WUA Unable to LP with coags , plat risk Treat BBB empiric Pct down ,no pressors,some clinical progress  Family updated: Niece updated  Interdisciplinary Family Meeting v Palliative Care Meeting:  Due by: 03/08.  CC time: 30 minutes.   Lavon Paganini. Titus Mould, MD, [place] Pgr: [facility]

## 2015-11-08 NOTE — Progress Notes (Signed)
eLink Physician-Brief Progress Note Patient Name: TASON LARMORE DOB: 1946-04-16 MRN: [id number]   Date of Service  11/08/2015  HPI/Events of Note  A-line BP much higher than cuff Hypertensive, IABP out, off pressors  eICU Interventions  Dc a-line hydrallazine prn     Intervention Category Intermediate Interventions: Hypertension - evaluation and management  ALVA,RAKESH V. 11/08/2015, 9:59 PM

## 2015-11-08 NOTE — Progress Notes (Signed)
Patient ID: William Mccullough, male   DOB: [birth year], 70 y.o.   MRN: [id number]   SUBJECTIVE: Patient remains intubated, sedated. Will not follow commands.   3/2: Respiratory arrest with intubation followed by PEA arrest x 2.  IABP placed.  Pressors begun.  CXR with RLL PNA. PCT > 175. Strep pneumo urine antigen+, flu -.  Troponin > 65   Echo: EF 15-20% with decreased RV function.   Remains on vent. Sedated. Awakens but doesn't follow commands. Off norepinephrine and vasopressin. Remains on milrinone.  IABP remains in placed augmenting 1:1.  BP stable but co-ox 72%.  CVP 7-8. Lasix being held.  Platelets falling, now 77k. Creatinine up to 3.6  Scheduled Meds: . antiseptic oral rinse  7 mL Mouth Rinse 10 times per day  . cefTRIAXone (ROCEPHIN)  IV  2 g Intravenous Q12H  . chlorhexidine gluconate  15 mL Mouth Rinse BID  . insulin aspart  0-15 Units Subcutaneous 6 times per day  . pantoprazole (PROTONIX) IV  40 mg Intravenous QHS  . sodium bicarbonate  50 mEq Intravenous Once  . sodium bicarbonate  50 mEq Intravenous Once  . sodium chloride  1,000 mL Intravenous Once  . sodium chloride flush  10-40 mL Intracatheter Q12H  . sodium chloride flush  3 mL Intravenous Q12H   Continuous Infusions: . sodium chloride Stopped (11/07/15 0800)  . sodium chloride 10 mL/hr at 11/08/15 1100  . feeding supplement (VITAL AF 1.2 CAL) 1,000 mL (11/08/15 1100)  . fentaNYL infusion INTRAVENOUS 150 mcg/hr (11/08/15 1100)  . heparin 550 Units/hr (11/08/15 1100)  . milrinone 0.25 mcg/kg/min (11/08/15 1100)  . norepinephrine (LEVOPHED) Adult infusion Stopped (11/08/15 0256)  . vasopressin (PITRESSIN) infusion - *FOR SHOCK* Stopped (11/08/15 0401)   PRN Meds:.sodium chloride, Place/Maintain arterial line **AND** sodium chloride, albuterol, fentaNYL, midazolam, midazolam, sodium chloride flush   Filed Vitals:   11/08/15 1000 11/08/15 1100 11/08/15 1136 11/08/15 1200  BP: 146/57 130/60 133/64 117/61  Pulse: 76  83    Temp: 96.8 F (36 C) 97 F (36.1 C)    TempSrc:      Resp: 14 18    Height:      Weight:      SpO2: 96% 96% 97%     Intake/Output Summary (Last 24 hours) at 11/08/15 1309 Last data filed at 11/08/15 1200  Gross per 24 hour  Intake 4295.86 ml  Output   1790 ml  Net 2505.86 ml    LABS: Basic Metabolic Panel:  Recent Labs  12/03/2015 0245  12/05/2015 1833 11/07/15 0325 11/07/15 1649 11/08/15 0357  NA 143  < > 142 141 143 140  K 3.9  < > 3.5 3.8 3.8 4.0  CL 109  < > 102 99* 97* 98*  CO2 17*  < > 27 26 30 28   GLUCOSE 140*  < > 109* 98 112* 148*  BUN 14  < > 23* 28* 36* 43*  CREATININE 1.69*  < > 2.61* 2.92* 3.54* 3.61*  CALCIUM 8.5*  < > 8.0* 7.7* 7.6* 7.5*  MG 2.0  --   --   --  1.6*  --   PHOS 5.3*  < > 2.7 2.8  --   --   < > = values in this interval not displayed. Liver Function Tests:  Recent Labs  11/07/15 0325 11/08/15 0357  AST 724* 307*  ALT 510* 414*  ALKPHOS 61 65  BILITOT 2.3* 1.2  PROT 4.1* 4.6*  ALBUMIN 2.0* 2.2*  No results for input(s): LIPASE, AMYLASE in the last 72 hours. CBC:  Recent Labs  11/26/2015 2239  11/07/15 0325 11/08/15 0357  WBC 20.9*  < > 13.6* 18.4*  NEUTROABS 16.5*  --  12.1*  --   HGB 12.9*  < > 9.8* 9.1*  HCT 37.7*  < > 28.6* 26.1*  MCV 84.2  < > 78.8 79.3  PLT 175  < > 86* 77*  < > = values in this interval not displayed. Cardiac Enzymes:  Recent Labs  11/11/2015 0958 11/30/2015 1150 11/28/2015 1833  TROPONINI >65.00* >65.00* >65.00*   BNP: Invalid input(s): POCBNP D-Dimer: No results for input(s): DDIMER in the last 72 hours. Hemoglobin A1C:  Recent Labs  12/05/2015 0120  HGBA1C 6.2*   Fasting Lipid Panel: No results for input(s): CHOL, HDL, LDLCALC, TRIG, CHOLHDL, LDLDIRECT in the last 72 hours. Thyroid Function Tests:  Recent Labs  11/30/2015 0120  TSH 6.243*   Anemia Panel: No results for input(s): VITAMINB12, FOLATE, FERRITIN, TIBC, IRON, RETICCTPCT in the last 72 hours.  RADIOLOGY: US  Renal  12/05/2015  CLINICAL DATA:  Acute renal injury EXAM: RENAL / URINARY TRACT ULTRASOUND COMPLETE COMPARISON:  None. FINDINGS: Right Kidney: Length: 10.8 cm. Echogenicity within normal limits. No mass or hydronephrosis visualized. Left Kidney: Length: 10.3 cm. No hydronephrosis is noted. Calcifications are identified within the renal pelvis consistent with renal calculi. The largest of these measures 9 mm. Bladder: Decompressed by Foley catheter. IMPRESSION: Left renal calculi without acute abnormality. Electronically Signed   By: Inez Catalina M.D.   On: 11/24/2015 18:54   Dg Chest Port 1 View  11/08/2015  CLINICAL DATA:  Pneumonia EXAM: PORTABLE CHEST 1 VIEW COMPARISON:  November 07, 2015 FINDINGS: The ET and NG tubes are again identified. A left central line is stable. A transcutaneous pacemaker pad is identified. An intra-aortic balloon pump is again identified with the distal tip 3.7 cm below the top of the aortic arch. This has probably been pulled back slightly in the interval. Stable cardiomegaly. No pulmonary nodules or masses. The focal opacity in the medial right lung base is improved but not completely resolve. Mild edema is suspected as well. IMPRESSION: The support apparatus is in good position. Improving focal opacity in the right infrahilar region. Mild edema. Electronically Signed   By: Dorise Bullion III M.D   On: 11/08/2015 08:03   Dg Chest Port 1 View  11/07/2015  CLINICAL DATA:  Respiratory failure. EXAM: PORTABLE CHEST 1 VIEW COMPARISON:  11/05/2015. FINDINGS: Endotracheal tube, NG tube, left IJ line stable position. Balloon pump in stable position. Cardiomegaly with normal pulmonary vascularity. Right lower lobe infiltrate consistent pneumonia. No pleural effusion or pneumothorax . IMPRESSION: 1. Lines and tubes stable position. Balloon pump in stable position. 2. Persistent right lower lobe infiltrate consistent pneumonia. Similar findings noted on prior exam. 3. Cardiomegaly.  No  pulmonary venous congestion . Electronically Signed   By: Marcello Moores  Register   On: 11/07/2015 07:14   Dg Chest Port 1 View  12/02/2015  CLINICAL DATA:  Evaluate intra-aortic balloon pump placement. EXAM: PORTABLE CHEST 1 VIEW COMPARISON:  11/24/2015 at 8:08 a.m. FINDINGS: The metallic portion of the intra-aortic balloon pump is superimposed upon the inferior margin of the aortic knob, well positioned. Endotracheal tube, left internal jugular central venous line and oral/ nasogastric tube are stable. There is improved right lung aeration when compared the prior study. There is significantly less airspace opacification. Residual airspace opacification is noted at the right base.  IMPRESSION: 1. Metal tip of the intra-aortic balloon pump projects along the inferior margin of the aortic knob. 2. Significant improvement in right lung aeration since the earlier exam. No new lung abnormalities. Electronically Signed   By: Lajean Manes M.D.   On: 12/02/2015 19:16   Dg Chest Port 1 View  11/21/2015  CLINICAL DATA:  Central line placement EXAM: PORTABLE CHEST 1 VIEW COMPARISON:  Chest x-ray from earlier same day and from 11/11/2015. FINDINGS: Heart size is stable. Overall cardiomediastinal silhouette is stable in size and configuration. Endotracheal tube remains well positioned with tip approximately 3 cm above the level of the carina. Nasogastric tube passes below the diaphragm. New left IJ central line in place with tip adequately positioned over the mid SVC. No pneumothorax seen. Diffuse airspace opacities are again noted throughout the right lung. Again noted is a probable small right pleural effusion, unchanged. No new lung findings. IMPRESSION: 1. New left IJ central line appears adequately positioned with tip projected over the mid SVC. No pneumothorax. 2. Otherwise stable chest x-ray. Diffuse airspace opacities throughout the right lung, most prominent at the right lung base, unchanged in the short-term interval,  most suggestive of pneumonia versus asymmetric pulmonary edema. Probable small right pleural effusion. Left lung remains clear. Electronically Signed   By: Franki Cabot M.D.   On: 11/18/2015 08:21   Dg Chest Port 1 View  12/05/2015  CLINICAL DATA:  Respiratory failure. EXAM: PORTABLE CHEST 1 VIEW COMPARISON:  12/03/2015. FINDINGS: Endotracheal tube, NG tube in stable position. Heart size stable. Stable cardiomegaly. Diffuse right lung infiltrate, all slight interim improvement. Mild atelectasis and/or infiltrate left lung base. Small right pleural effusion cannot be excluded. No pneumothorax . Previously identified tiny metallic density noted along the right lateral chest well no longer identified. IMPRESSION: 1. Lines and tubes in stable position. 2. Diffuse right lung infiltrate, slight interim improvement. Mild left lower lobe atelectasis and/or infiltrate. Small right pleural effusion cannot be excluded. Electronically Signed   By: Marcello Moores  Register   On: 11/06/2015 07:16   Dg Chest Portable 1 View  11/09/2015  CLINICAL DATA:  Intubation. Respiratory distress. History of hypertension, prostate cancer. EXAM: PORTABLE CHEST 1 VIEW COMPARISON:  Chest radiograph September 08, 2011 FINDINGS: Endotracheal tube tip projects 2.3 cm above the carina. Nasogastric tube past the proximal stomach, distal tip not imaged. Multiple pacer pads and wires overlie the chest. The cardiac silhouette appears moderately enlarged, mediastinal silhouette is nonsuspicious. Diffuse interstitial prominence, RIGHT greater than LEFT with pulmonary vascular congestion. No pleural effusions though LEFT costophrenic angle is not imaged. No pneumothorax. Small metallic linear density projecting at RIGHT chest wall may be the external to the patient or, represent needle fragment. Recommend direct inspection. IMPRESSION: Endotracheal tube tip projects 2.3 cm above the carina. Nasogastric tube past proximal stomach. Increasing cardiomegaly and  findings of asymmetric interstitial pulmonary edema. Electronically Signed   By: Elon Alas M.D.   On: 11/25/2015 22:59   Dg Abd Portable 1v  11/21/2015  CLINICAL DATA:  70 year old male status post intubation and enteric tube placement. Respiratory distress. EXAM: PORTABLE ABDOMEN - 1 VIEW COMPARISON:  None. FINDINGS: An endotracheal tube is partially visualized extending into the left hemi abdomen with tip positioned in the left lower abdomen overlying the iliac crest. Normal caliber air-filled loops of small bowel noted. There is cardiomegaly. There is diffuse hazy density of the right lung base. A 2 cm linear radiopaque density over the right lung base may be superimposed on  the patient. Clinical correlation is recommended. IMPRESSION: Enteric tube the tip in the left lower abdomen superimposed over the iliac crest. Electronically Signed   By: Anner Crete M.D.   On: 11/23/2015 23:02   Ct Portable Head W/o Cm  11/07/2015  CLINICAL DATA:  Altered mental status EXAM: CT HEAD WITHOUT CONTRAST TECHNIQUE: Contiguous paraaxial images were obtained from the base of the skull through the vertex without intravenous contrast. COMPARISON:  None. FINDINGS: There is mild diffuse atrophy. There is no intracranial mass, hemorrhage, extra-axial fluid collection, or midline shift. There are lacunar type infarcts in the centra semiovale bilaterally. There is patchy small vessel disease in the centra semiovale bilaterally. There is no demonstrable acute infarct. The bony calvarium appears intact. The mastoid air cells are clear. No intraorbital lesions are evident. 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 No acute infarct evident. No hemorrhage or mass effect. Electronically Signed   By: Lowella Grip III M.D.   On: 11/07/2015 13:40    PHYSICAL EXAM General: Intubated, sedated.  Neck: Dilated IJ Left TLC, no thyromegaly  or thyroid nodule.  Lungs: Coarse BS bilaterally. CV: Lateral PMI.  Heart regular S1/S2, no S3/S4, IABP sounds.  No peripheral edema.   Abdomen: Soft, no hepatosplenomegaly, no distention.  Neurologic: Not following commands, gaze to upper left.   Extremities: No clubbing or cyanosis. IABP site ok.   TELEMETRY: Reviewed telemetry pt in NSR  ASSESSMENT AND PLAN: 70 yo with history of prior prostate cancer treated with radiation, known CAD, and active smoking was found by friends on 3/2 to be in respiratory distress. To ER, intubated, NSTEMI with troponin >65. He had CPR for PEA x 2, IABP placed.  Suspect co-existing severe PNA and NSTEMI.  1. ID: RLL PNA on CXR, PCT > 175, Strep pneumo urine antigen positive.  I think that he probably has PNA co-existing with MI and mixed septic/cardiogenic shock.   - Broad spectrum abx per CCM.  2. Cardiogenic (+/- septic) shock: Echo with EF 15-20%.  Off pressors except for milrinone. IABP 1:1. Co-ox much improved.  CVP 8-9 - Continue milrinone 0.25 mcg/kg/min.   - Will try to wean IABP as no longer getting much augmentation. Drop to 1:3. Repeat co-ox 1 hour. If still > 60% will stop heparin and pull IABP this afternoon. - Continue IABP support for now.  He is on heparin gtt for IABP.  Fall in heparin concerning, likely triggered by IABP.  3. CAD: Suspect NSTEMI with TnI now > 65 and low EF.  No cath/intervention yet.  - Will need angiography if/when more stable.  - Continue ASA 81, statin.  On heparin with IABP.  4. AKI: Suspect triggered by shock.  Renal following, may end up needing CVVH.  5. Neuro: Now s/p PEA arrest with CPR x 2.  Not following commands and gazes left.  Suspect will need neuro evaluation. Prognosis concerning. 6. Heme: Fall in plts and hemoglobin likely related to IABP (+/- effect from sepsis). Doubt HIT.  Will follow. Try to remove IABP today 7. Elevated LFTs: Shock liver. Improving with hemodynamic support  More hemodynamically  stable but remains with multi-system organ failure and possible anoxic brain injury. Prognosis quite tenuous.   The patient is critically ill with multiple organ systems failure and requires high complexity decision making for assessment and support, frequent evaluation and titration of therapies, application of advanced monitoring technologies and extensive interpretation of multiple databases.  Critical Care Time devoted to patient care services described in this note is 35 Minutes.    BensimhonQuillian Quince 11/08/2015 1:09 PM

## 2015-11-08 NOTE — Progress Notes (Signed)
Hypoglycemic Event  CBG: 0830  Treatment: D50 IV 50 mL  Symptoms: None  Follow-up CBG: Time:0900 CBG Result:158  Possible Reasons for Event: Unknown  Comments/MD notified:feinstein    William Mccullough

## 2015-11-08 NOTE — Progress Notes (Signed)
0845 blood gas results crossed over wrong,correct labs are ph 7.3,pco2 60.1,p02 102.William Mccullough

## 2015-11-08 NOTE — Progress Notes (Signed)
IABP present in right femoral artery. IABP removed, manual pressure applied for 35 minutes. Hemostasis achieved. No S+S of hematoma, groin level 0. Patient intubated and unresponsive.  Tegaderm dressing applied. Distal right DP pulse  Weakly palpable.  Bedrest ( right leg immobile) begins at 16:35 for 6 hours.

## 2015-11-08 NOTE — Progress Notes (Signed)
[place] for heparin Indication: chest pain/ACS; s/p IABP placement  No Known Allergies  Patient Measurements: Height: 5\' 9"  (175.3 cm) Weight: 120 lb 9.5 oz (54.7 kg) IBW/kg (Calculated) : 70.7  Vital Signs: Temp: 96.8 F (36 C) (03/04 0700) Temp Source: Core (Comment) (03/04 0000) BP: 147/59 mmHg (03/04 0800) Pulse Rate: 79 (03/04 0700)  Labs:  Recent Labs  11/17/2015 2255  11/12/2015 0657 12/05/2015 0958 12/04/2015 1150  11/13/2015 1832 12/04/2015 1833 11/07/15 0325 11/07/15 1225 11/07/15 1649 11/08/15 0357  HGB  --   < > 12.1*  --   --   --   --   --  9.8*  --   --  9.1*  HCT  --   < > 35.1*  --   --   --   --   --  28.6*  --   --  26.1*  PLT  --   < > 134*  --   --   --   --   --  86*  --   --  77*  APTT  --   --   --   --   --   --   --   --   --  >200*  --   --   LABPROT 15.6*  --   --   --   --   --   --   --   --  26.6*  --   --   INR 1.22  --   --   --   --   --   --   --   --  2.49*  --   --   HEPARINUNFRC  --   --   --   --   --   --  0.25*  --  0.38  --   --  0.23*  CREATININE 1.90*  < > 2.11*  --   --   < >  --  2.61* 2.92*  --  3.54* 3.61*  TROPONINI  --   < > 31.57* >65.00* >65.00*  --   --  >65.00*  --   --   --   --   < > = values in this interval not displayed.  Estimated Creatinine Clearance: 15.4 mL/min (by C-G formula based on Cr of 3.61).   Medical History: Past Medical History  Diagnosis Date  . Hypertension   . Heart murmur     pt told he has heart murmur-states it does not cause him any problems  . Cough     09/08/11  NEW ONSET OF COUGH--STARTED 09/04/11 - NO FEVER--PT THINKS HE MAY BE GETTING A COLD  . Cold     Head congestion, non productive cough, afebrile  . H/O acute pancreatitis   . Prostate cancer ([place])   . Cardiomyopathy   . Elevated PSA   . Anxiety   . Depression   . Undiagnosed cardiac murmurs     functional murmur  . History of radiation therapy 11/24/11 - 01/24/12    prostate, seminal  vesicles    Medications:  Prescriptions prior to admission  Medication Sig Dispense Refill Last Dose  . amLODipine (NORVASC) 10 MG tablet Take 1 tablet (10 mg total) by mouth daily. 30 tablet 11 Taking  . aspirin 81 MG tablet Take 81 mg by mouth daily.     Marland Kitchen atorvastatin (LIPITOR) 20 MG tablet Take 1 tablet (20 mg total) by mouth daily. 30 tablet  11 Taking  . buPROPion (WELLBUTRIN SR) 150 MG 12 hr tablet Take 1 tablet (150 mg total) by mouth 2 (two) times daily. 60 tablet 2 Taking  . carvedilol (COREG) 12.5 MG tablet Take 1 tablet (12.5 mg total) by mouth 2 (two) times daily. 180 tablet 3 Taking  . cloNIDine (CATAPRES) 0.1 MG tablet Take 1 tablet (0.1 mg total) by mouth 2 (two) times daily. 60 tablet 11 Taking  . isosorbide mononitrate (IMDUR) 60 MG 24 hr tablet Take 1 tablet (60 mg total) by mouth every morning. 30 tablet 11 Taking  . phenazopyridine (PYRIDIUM) 200 MG tablet Take 200 mg by mouth 3 (three) times daily as needed. Pyridium 200 mg tablet take one by mouth tid prn painful urination dispense 60 one refill called to [facility] at [place]. 12/22/2011 at 1135   Taking  . sildenafil (VIAGRA) 25 MG tablet Take 25 mg by mouth daily as needed for erectile dysfunction.     . solifenacin (VESICARE) 5 MG tablet Take 5 mg by mouth daily.     . Tamsulosin HCl (FLOMAX) 0.4 MG CAPS Take 0.4 mg by mouth daily. Flomax 0.4mg  one tablet by mouth per day; qty 30; 3 refills; called into [facility] at [place]. 12/22/11 at 1134   Taking  . telmisartan-hydrochlorothiazide (MICARDIS HCT) 80-25 MG per tablet Take 1 tablet by mouth daily. 30 tablet 11 Taking   Scheduled:   Infusions:  . sodium chloride Stopped (11/07/15 0800)  . sodium chloride 75 mL/hr at 11/07/15 2203  . feeding supplement (VITAL AF 1.2 CAL) 1,000 mL (11/08/15 0435)  . fentaNYL infusion INTRAVENOUS 150 mcg/hr (11/08/15 0430)  . heparin 550 Units/hr (11/07/15 2119)  . milrinone 0.25  mcg/kg/min (11/08/15 0215)  . norepinephrine (LEVOPHED) Adult infusion Stopped (11/08/15 0256)  . vasopressin (PITRESSIN) infusion - *FOR SHOCK* Stopped (11/08/15 0401)    Assessment: 70yo male found unresponsive by friends and foaming at the mouth, intubated and started on ABX for sepsis and possible PNA, now w/ elevated troponin, started on IV heparin.  Pt s/p cath lab visit and IABP insertion. IV heparin initiated upon arrival to [facility].  AM heparin level is within 0.2-0.5 goal (0.23) on 550 units/hr.  Some light bleeding from IABP site yesterday and this morning per RN and INR jumped to 2.29 yesterday. Checking STAT INR now and will monitor bleeding while on heparin.    Goal of Therapy:  Heparin level 0.2-0.5 while on IABP Monitor platelets by anticoagulation protocol: Yes   Plan:  1. Continue heparin gtt at 550 units/hr. 2. Daily heparin level and CBC. 3. F/U IABP weaning plans and monitor for bleeding 4. F/U STAT INR   Lanorris Kalisz C. Lennox Grumbles, PharmD Pharmacy Resident  Pager: [phone] 11/08/2015 8:34 AM

## 2015-11-08 NOTE — Progress Notes (Signed)
Pt's balloon pump switched to 1:3 at 1300 per md order, heparin turned off in anticipation of discontinuing balloon pump . Co-ox drawn at 1400 and was 79. ACT at 1400 was 167. Continue to monitor hourly and cath lab to dc balloon pump when ACT less than 150. Etta Quill

## 2015-11-09 ENCOUNTER — Inpatient Hospital Stay (HOSPITAL_COMMUNITY): Payer: Medicare Other

## 2015-11-09 LAB — CBC WITH DIFFERENTIAL/PLATELET
BASOS ABS: 0 10*3/uL (ref 0.0–0.1)
BASOS PCT: 0 %
EOS ABS: 0.1 10*3/uL (ref 0.0–0.7)
Eosinophils Relative: 1 %
HCT: 26.6 % — ABNORMAL LOW (ref 39.0–52.0)
HEMOGLOBIN: 9.1 g/dL — AB (ref 13.0–17.0)
Lymphocytes Relative: 6 %
Lymphs Abs: 1.1 10*3/uL (ref 0.7–4.0)
MCH: 27.3 pg (ref 26.0–34.0)
MCHC: 34.2 g/dL (ref 30.0–36.0)
MCV: 79.9 fL (ref 78.0–100.0)
MONOS PCT: 3 %
Monocytes Absolute: 0.5 10*3/uL (ref 0.1–1.0)
NEUTROS PCT: 90 %
Neutro Abs: 15.9 10*3/uL — ABNORMAL HIGH (ref 1.7–7.7)
Platelets: 73 10*3/uL — ABNORMAL LOW (ref 150–400)
RBC: 3.33 MIL/uL — ABNORMAL LOW (ref 4.22–5.81)
RDW: 13.9 % (ref 11.5–15.5)
WBC: 17.6 10*3/uL — AB (ref 4.0–10.5)

## 2015-11-09 LAB — GLUCOSE, CAPILLARY
GLUCOSE-CAPILLARY: 180 mg/dL — AB (ref 65–99)
GLUCOSE-CAPILLARY: 97 mg/dL (ref 65–99)
Glucose-Capillary: 131 mg/dL — ABNORMAL HIGH (ref 65–99)
Glucose-Capillary: 114 mg/dL — ABNORMAL HIGH (ref 65–99)
Glucose-Capillary: 152 mg/dL — ABNORMAL HIGH (ref 65–99)

## 2015-11-09 LAB — COMPREHENSIVE METABOLIC PANEL
ALK PHOS: 59 U/L (ref 38–126)
ALT: 268 U/L — AB (ref 17–63)
AST: 114 U/L — ABNORMAL HIGH (ref 15–41)
Albumin: 2.1 g/dL — ABNORMAL LOW (ref 3.5–5.0)
Anion gap: 12 (ref 5–15)
BILIRUBIN TOTAL: 0.6 mg/dL (ref 0.3–1.2)
BUN: 47 mg/dL — ABNORMAL HIGH (ref 6–20)
CALCIUM: 8.2 mg/dL — AB (ref 8.9–10.3)
CO2: 31 mmol/L (ref 22–32)
CREATININE: 3.71 mg/dL — AB (ref 0.61–1.24)
Chloride: 98 mmol/L — ABNORMAL LOW (ref 101–111)
GFR, EST AFRICAN AMERICAN: 18 mL/min — AB (ref >60)
GFR, EST NON AFRICAN AMERICAN: 16 mL/min — AB (ref >60)
Glucose, Bld: 140 mg/dL — ABNORMAL HIGH (ref 65–99)
Potassium: 3.5 mmol/L (ref 3.5–5.1)
Sodium: 141 mmol/L (ref 135–145)
TOTAL PROTEIN: 4.8 g/dL — AB (ref 6.5–8.1)

## 2015-11-09 LAB — CULTURE, RESPIRATORY

## 2015-11-09 LAB — CARBOXYHEMOGLOBIN
Carboxyhemoglobin: 1.1 % (ref 0.5–1.5)
Methemoglobin: 1 % (ref 0.0–1.5)
O2 SAT: 75.8 %
TOTAL HEMOGLOBIN: 9.4 g/dL — AB (ref 13.5–18.0)

## 2015-11-09 LAB — CULTURE, RESPIRATORY W GRAM STAIN: Culture: NO GROWTH

## 2015-11-09 LAB — HEPARIN LEVEL (UNFRACTIONATED): Heparin Unfractionated: <0.1 IU/mL — ABNORMAL LOW (ref 0.30–0.70)

## 2015-11-09 MED ORDER — FENTANYL CITRATE (PF) 100 MCG/2ML IJ SOLN
INTRAMUSCULAR | Status: AC
  Filled 2015-11-09: qty 2

## 2015-11-09 MED ORDER — FENTANYL CITRATE (PF) 100 MCG/2ML IJ SOLN
100.0000 ug | Freq: Once | INTRAMUSCULAR | Status: AC
  Administered 2015-11-09: 100 ug via INTRAVENOUS
  Filled 2015-11-09: qty 2

## 2015-11-09 MED ORDER — ISOSORBIDE DINITRATE 10 MG PO TABS
10.0000 mg | ORAL_TABLET | Freq: Three times a day (TID) | ORAL | Status: DC
  Administered 2015-11-09 (×3): 10 mg
  Filled 2015-11-09 (×3): qty 1

## 2015-11-09 MED ORDER — ASPIRIN 81 MG PO CHEW
81.0000 mg | CHEWABLE_TABLET | Freq: Every day | ORAL | Status: DC
  Administered 2015-11-09 – 2015-11-16 (×8): 81 mg
  Filled 2015-11-09 (×8): qty 1

## 2015-11-09 MED ORDER — MIDAZOLAM HCL 2 MG/2ML IJ SOLN
2.0000 mg | Freq: Once | INTRAMUSCULAR | Status: AC
  Administered 2015-11-09: 2 mg via INTRAVENOUS
  Filled 2015-11-09: qty 2

## 2015-11-09 MED ORDER — HEPARIN SODIUM (PORCINE) 5000 UNIT/ML IJ SOLN
5000.0000 [IU] | Freq: Three times a day (TID) | INTRAMUSCULAR | Status: DC
  Administered 2015-11-09 – 2015-11-10 (×4): 5000 [IU] via SUBCUTANEOUS
  Filled 2015-11-09 (×4): qty 1

## 2015-11-09 MED ORDER — MIDAZOLAM HCL 2 MG/2ML IJ SOLN
INTRAMUSCULAR | Status: AC
  Filled 2015-11-09: qty 2

## 2015-11-09 MED ORDER — ATORVASTATIN CALCIUM 40 MG PO TABS
40.0000 mg | ORAL_TABLET | Freq: Every day | ORAL | Status: DC
  Administered 2015-11-09 – 2015-11-18 (×9): 40 mg
  Filled 2015-11-09 (×9): qty 1

## 2015-11-09 MED ORDER — HYDRALAZINE HCL 25 MG PO TABS
25.0000 mg | ORAL_TABLET | Freq: Three times a day (TID) | ORAL | Status: DC
  Administered 2015-11-09 – 2015-11-10 (×4): 25 mg
  Filled 2015-11-09 (×4): qty 1

## 2015-11-09 NOTE — Progress Notes (Signed)
Patient ID: William Mccullough, male   DOB: 02/14/1946, 70 y.o.   MRN: [id number]   SUBJECTIVE: Patient remains intubated, sedated. Opens eyes but will not follow commands.   3/2: Respiratory arrest with intubation followed by PEA arrest x 2.  IABP placed.  Pressors begun.  CXR with RLL PNA. PCT > 175. Strep pneumo urine antigen+, flu -.  Troponin > 65   3/4: IABP out.   Echo: EF 15-20% with decreased RV function.   Remains on vent. Sedated. Awakens but doesn't follow commands. Off norepinephrine and vasopressin. Remains on milrinone 0.25. Co-ox 76%, CVP 5-7.    Scheduled Meds: . antiseptic oral rinse  7 mL Mouth Rinse 10 times per day  . aspirin  81 mg Per Tube Daily  . atorvastatin  40 mg Per Tube q1800  . cefTRIAXone (ROCEPHIN)  IV  2 g Intravenous Q12H  . chlorhexidine gluconate  15 mL Mouth Rinse BID  . hydrALAZINE  25 mg Per Tube 3 times per day  . insulin aspart  0-15 Units Subcutaneous 6 times per day  . isosorbide dinitrate  10 mg Per Tube TID  . pantoprazole (PROTONIX) IV  40 mg Intravenous QHS  . sodium bicarbonate  50 mEq Intravenous Once  . sodium bicarbonate  50 mEq Intravenous Once  . sodium chloride  1,000 mL Intravenous Once  . sodium chloride flush  10-40 mL Intracatheter Q12H  . sodium chloride flush  3 mL Intravenous Q12H   Continuous Infusions: . sodium chloride Stopped (11/07/15 0800)  . sodium chloride 10 mL/hr at 11/08/15 2100  . feeding supplement (VITAL AF 1.2 CAL) 1,000 mL (11/09/15 0400)  . fentaNYL infusion INTRAVENOUS 150 mcg/hr (11/09/15 0600)  . heparin Stopped (11/08/15 1300)  . milrinone 0.25 mcg/kg/min (11/09/15 0407)  . norepinephrine (LEVOPHED) Adult infusion Stopped (11/08/15 0256)  . vasopressin (PITRESSIN) infusion - *FOR SHOCK* Stopped (11/08/15 0401)   PRN Meds:.sodium chloride, Place/Maintain arterial line **AND** sodium chloride, albuterol, fentaNYL, hydrALAZINE, midazolam, midazolam, sodium chloride flush   Filed Vitals:   11/09/15  0600 11/09/15 0630 11/09/15 0700 11/09/15 0740  BP: 152/79 148/90 179/84 120/68  Pulse: 77 82 64   Temp: 97.9 F (36.6 C) 97.9 F (36.6 C) 98.1 F (36.7 C)   TempSrc:      Resp: 12 12 13    Height:      Weight:      SpO2: 99% 98% 98% 96%    Intake/Output Summary (Last 24 hours) at 11/09/15 0751 Last data filed at 11/09/15 0700  Gross per 24 hour  Intake 1945.58 ml  Output   1835 ml  Net 110.58 ml    LABS: Basic Metabolic Panel:  Recent Labs  11/18/2015 1833 11/07/15 0325 11/07/15 1649 11/08/15 0357 11/09/15 0415  NA 142 141 143 140 141  K 3.5 3.8 3.8 4.0 3.5  CL 102 99* 97* 98* 98*  CO2 27 26 30 28 31   GLUCOSE 109* 98 112* 148* 140*  BUN 23* 28* 36* 43* 47*  CREATININE 2.61* 2.92* 3.54* 3.61* 3.71*  CALCIUM 8.0* 7.7* 7.6* 7.5* 8.2*  MG  --   --  1.6*  --   --   PHOS 2.7 2.8  --   --   --    Liver Function Tests:  Recent Labs  11/08/15 0357 11/09/15 0415  AST 307* 114*  ALT 414* 268*  ALKPHOS 65 59  BILITOT 1.2 0.6  PROT 4.6* 4.8*  ALBUMIN 2.2* 2.1*   No results for input(s):  LIPASE, AMYLASE in the last 72 hours. CBC:  Recent Labs  11/07/15 0325 11/08/15 0357 11/09/15 0415  WBC 13.6* 18.4* 17.6*  NEUTROABS 12.1*  --  15.9*  HGB 9.8* 9.1* 9.1*  HCT 28.6* 26.1* 26.6*  MCV 78.8 79.3 79.9  PLT 86* 77* 73*   Cardiac Enzymes:  Recent Labs  12/04/2015 0958 11/05/2015 1150 11/21/2015 1833  TROPONINI >65.00* >65.00* >65.00*   BNP: Invalid input(s): POCBNP D-Dimer: No results for input(s): DDIMER in the last 72 hours. Hemoglobin A1C: No results for input(s): HGBA1C in the last 72 hours. Fasting Lipid Panel: No results for input(s): CHOL, HDL, LDLCALC, TRIG, CHOLHDL, LDLDIRECT in the last 72 hours. Thyroid Function Tests: No results for input(s): TSH, T4TOTAL, T3FREE, THYROIDAB in the last 72 hours.  Invalid input(s): FREET3 Anemia Panel: No results for input(s): VITAMINB12, FOLATE, FERRITIN, TIBC, IRON, RETICCTPCT in the last 72  hours.  RADIOLOGY: US Renal  12/01/2015  CLINICAL DATA:  Acute renal injury EXAM: RENAL / URINARY TRACT ULTRASOUND COMPLETE COMPARISON:  None. FINDINGS: Right Kidney: Length: 10.8 cm. Echogenicity within normal limits. No mass or hydronephrosis visualized. Left Kidney: Length: 10.3 cm. No hydronephrosis is noted. Calcifications are identified within the renal pelvis consistent with renal calculi. The largest of these measures 9 mm. Bladder: Decompressed by Foley catheter. IMPRESSION: Left renal calculi without acute abnormality. Electronically Signed   By: Inez Catalina M.D.   On: 11/27/2015 18:54   Dg Chest Port 1 View  11/09/2015  CLINICAL DATA:  Pneumonia, history hypertension, prostate cancer, cardiomyopathy, smoker EXAM: PORTABLE CHEST 1 VIEW COMPARISON:  Portable exam 0424 hours compared to 11/08/2015 FINDINGS: Tip of endotracheal tube projects 3.8 cm above carina. Nasogastric tube extends into abdomen. External pacing leads in EKG leads noted. LEFT jugular central venous catheter tip projects over SVC. Minimal enlargement of cardiac silhouette with slight pulmonary vascular congestion. Mediastinal contour stable. Perihilar to basilar infiltrates bilaterally favoring pulmonary edema. No definite pleural effusion or pneumothorax. IMPRESSION: Suspected pulmonary edema, little changed. Electronically Signed   By: Lavonia Dana M.D.   On: 11/09/2015 07:05   Dg Chest Port 1 View  11/08/2015  CLINICAL DATA:  Pneumonia EXAM: PORTABLE CHEST 1 VIEW COMPARISON:  November 07, 2015 FINDINGS: The ET and NG tubes are again identified. A left central line is stable. A transcutaneous pacemaker pad is identified. An intra-aortic balloon pump is again identified with the distal tip 3.7 cm below the top of the aortic arch. This has probably been pulled back slightly in the interval. Stable cardiomegaly. No pulmonary nodules or masses. The focal opacity in the medial right lung base is improved but not completely resolve. Mild  edema is suspected as well. IMPRESSION: The support apparatus is in good position. Improving focal opacity in the right infrahilar region. Mild edema. Electronically Signed   By: Dorise Bullion III M.D   On: 11/08/2015 08:03   Dg Chest Port 1 View  11/07/2015  CLINICAL DATA:  Respiratory failure. EXAM: PORTABLE CHEST 1 VIEW COMPARISON:  11/23/2015. FINDINGS: Endotracheal tube, NG tube, left IJ line stable position. Balloon pump in stable position. Cardiomegaly with normal pulmonary vascularity. Right lower lobe infiltrate consistent pneumonia. No pleural effusion or pneumothorax . IMPRESSION: 1. Lines and tubes stable position. Balloon pump in stable position. 2. Persistent right lower lobe infiltrate consistent pneumonia. Similar findings noted on prior exam. 3. Cardiomegaly.  No pulmonary venous congestion . Electronically Signed   By: Marcello Moores  Register   On: 11/07/2015 07:14   Dg Chest  Port 1 View  11/26/2015  CLINICAL DATA:  Evaluate intra-aortic balloon pump placement. EXAM: PORTABLE CHEST 1 VIEW COMPARISON:  12/05/2015 at 8:08 a.m. FINDINGS: The metallic portion of the intra-aortic balloon pump is superimposed upon the inferior margin of the aortic knob, well positioned. Endotracheal tube, left internal jugular central venous line and oral/ nasogastric tube are stable. There is improved right lung aeration when compared the prior study. There is significantly less airspace opacification. Residual airspace opacification is noted at the right base. IMPRESSION: 1. Metal tip of the intra-aortic balloon pump projects along the inferior margin of the aortic knob. 2. Significant improvement in right lung aeration since the earlier exam. No new lung abnormalities. Electronically Signed   By: Lajean Manes M.D.   On: 11/17/2015 19:16   Dg Chest Port 1 View  11/07/2015  CLINICAL DATA:  Central line placement EXAM: PORTABLE CHEST 1 VIEW COMPARISON:  Chest x-ray from earlier same day and from 11/23/2015. FINDINGS:  Heart size is stable. Overall cardiomediastinal silhouette is stable in size and configuration. Endotracheal tube remains well positioned with tip approximately 3 cm above the level of the carina. Nasogastric tube passes below the diaphragm. New left IJ central line in place with tip adequately positioned over the mid SVC. No pneumothorax seen. Diffuse airspace opacities are again noted throughout the right lung. Again noted is a probable small right pleural effusion, unchanged. No new lung findings. IMPRESSION: 1. New left IJ central line appears adequately positioned with tip projected over the mid SVC. No pneumothorax. 2. Otherwise stable chest x-ray. Diffuse airspace opacities throughout the right lung, most prominent at the right lung base, unchanged in the short-term interval, most suggestive of pneumonia versus asymmetric pulmonary edema. Probable small right pleural effusion. Left lung remains clear. Electronically Signed   By: Franki Cabot M.D.   On: 11/21/2015 08:21   Dg Chest Port 1 View  11/23/2015  CLINICAL DATA:  Respiratory failure. EXAM: PORTABLE CHEST 1 VIEW COMPARISON:  11/16/2015. FINDINGS: Endotracheal tube, NG tube in stable position. Heart size stable. Stable cardiomegaly. Diffuse right lung infiltrate, all slight interim improvement. Mild atelectasis and/or infiltrate left lung base. Small right pleural effusion cannot be excluded. No pneumothorax . Previously identified tiny metallic density noted along the right lateral chest well no longer identified. IMPRESSION: 1. Lines and tubes in stable position. 2. Diffuse right lung infiltrate, slight interim improvement. Mild left lower lobe atelectasis and/or infiltrate. Small right pleural effusion cannot be excluded. Electronically Signed   By: Marcello Moores  Register   On: 11/18/2015 07:16   Dg Chest Portable 1 View  12/03/2015  CLINICAL DATA:  Intubation. Respiratory distress. History of hypertension, prostate cancer. EXAM: PORTABLE CHEST 1 VIEW  COMPARISON:  Chest radiograph September 08, 2011 FINDINGS: Endotracheal tube tip projects 2.3 cm above the carina. Nasogastric tube past the proximal stomach, distal tip not imaged. Multiple pacer pads and wires overlie the chest. The cardiac silhouette appears moderately enlarged, mediastinal silhouette is nonsuspicious. Diffuse interstitial prominence, RIGHT greater than LEFT with pulmonary vascular congestion. No pleural effusions though LEFT costophrenic angle is not imaged. No pneumothorax. Small metallic linear density projecting at RIGHT chest wall may be the external to the patient or, represent needle fragment. Recommend direct inspection. IMPRESSION: Endotracheal tube tip projects 2.3 cm above the carina. Nasogastric tube past proximal stomach. Increasing cardiomegaly and findings of asymmetric interstitial pulmonary edema. Electronically Signed   By: Elon Alas M.D.   On: 11/25/2015 22:59   Dg Abd Portable 1v  11/24/2015  CLINICAL DATA:  70 year old male status post intubation and enteric tube placement. Respiratory distress. EXAM: PORTABLE ABDOMEN - 1 VIEW COMPARISON:  None. FINDINGS: An endotracheal tube is partially visualized extending into the left hemi abdomen with tip positioned in the left lower abdomen overlying the iliac crest. Normal caliber air-filled loops of small bowel noted. There is cardiomegaly. There is diffuse hazy density of the right lung base. A 2 cm linear radiopaque density over the right lung base may be superimposed on the patient. Clinical correlation is recommended. IMPRESSION: Enteric tube the tip in the left lower abdomen superimposed over the iliac crest. Electronically Signed   By: Anner Crete M.D.   On: 12/05/2015 23:02   Ct Portable Head W/o Cm  11/07/2015  CLINICAL DATA:  Altered mental status EXAM: CT HEAD WITHOUT CONTRAST TECHNIQUE: Contiguous paraaxial images were obtained from the base of the skull through the vertex without intravenous contrast.  COMPARISON:  None. FINDINGS: There is mild diffuse atrophy. There is no intracranial mass, hemorrhage, extra-axial fluid collection, or midline shift. There are lacunar type infarcts in the centra semiovale bilaterally. There is patchy small vessel disease in the centra semiovale bilaterally. There is no demonstrable acute infarct. The bony calvarium appears intact. The mastoid air cells are clear. No intraorbital lesions are evident. 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 No acute infarct evident. No hemorrhage or mass effect. Electronically Signed   By: Lowella Grip III M.D.   On: 11/07/2015 13:40    PHYSICAL EXAM General: Intubated, sedated.  Neck:  Left TLC, no thyromegaly or thyroid nodule.  Lungs: Coarse BS bilaterally. CV: Lateral PMI.  Heart regular S1/S2, no S3/S4.  No peripheral edema.   Abdomen: Soft, no hepatosplenomegaly, no distention.  Neurologic: Not following commands, gaze to upper left.   Extremities: No clubbing or cyanosis. IABP site ok.   TELEMETRY: Reviewed telemetry pt in NSR  ASSESSMENT AND PLAN: 70 yo with history of prior prostate cancer treated with radiation, known CAD, and active smoking was found by friends on 3/2 to be in respiratory distress. To ER, intubated, NSTEMI with troponin >65. He had CPR for PEA x 2, IABP placed.  Suspect co-existing severe PNA and NSTEMI.  1. ID: RLL PNA on CXR, PCT > 175, Strep pneumo urine antigen positive.  I think that he probably has PNA co-existing with MI and mixed septic/cardiogenic shock.   - Broad spectrum abx per CCM.  2. Cardiogenic (+/- septic) shock: Echo with EF 15-20%.  Off pressors, IABP out.  Remains on milrinone 0.25.  Co-ox 76% with CVP 5-7. BP high.  - Decrease milrinone to 0.125.   - Add hydralazine 25 tid + isordil 10 tid. 3. CAD: Suspect NSTEMI with TnI now > 65 and low EF.  No cath/intervention yet.  - Will need  angiography if/when more stable.  - Continue ASA 81, statin.   4. AKI: Suspect triggered by shock.  Renal following, may end up needing CVVH but still making urine.  5. Neuro: Now s/p PEA arrest with CPR x 2.  Not following commands and gazes left.  Suspect will need neuro evaluation. Prognosis concerning. 6. Heme: Fall in plts and hemoglobin likely related to IABP (+/- effect from sepsis). Doubt HIT.  Will follow.  7. Elevated LFTs: Shock liver. LFTs now coming down.  Hemodynamically stable but remains with multi-system organ failure and possible anoxic brain injury. Prognosis quite tenuous.  The patient is critically ill with multiple organ systems failure and requires high complexity decision making for assessment and support, frequent evaluation and titration of therapies, application of advanced monitoring technologies and extensive interpretation of multiple databases.   Critical Care Time devoted to patient care services described in this note is 35 Minutes.   Loralie Champagne 11/09/2015 7:51 AM

## 2015-11-09 NOTE — Progress Notes (Signed)
RN had planned to take pt to MRI at 1345,MRI called and are behind and their next available time is 9pm tonight. Etta Quill

## 2015-11-09 NOTE — Progress Notes (Signed)
Pt has started attempting to look at nurse, unable to track but is moving eyes,hasnt moved eyes since admission. Pt . Also squeezed rn's hand. MRI scheduled for 9 pm.

## 2015-11-09 NOTE — Progress Notes (Signed)
Spoke with Dorena Bodo (nephew) and Luvenia Heller (niece) tonight and they will both be on the unit at 12:00pm tomorrow to speak with CCM.

## 2015-11-09 NOTE — Progress Notes (Signed)
[facility] ROUNDING NOTE   Subjective:   Interval History: no new developments this morning appears to be stable   Objective:  Vital signs in last 24 hours:  Temp:  [96.8 F (36 C)-98.4 F (36.9 C)] 97.9 F (36.6 C) (03/05 0800) Pulse Rate:  [64-119] 72 (03/05 0800) Resp:  [12-19] 19 (03/05 0800) BP: (104-225)/(46-116) 165/71 mmHg (03/05 0800) SpO2:  [86 %-100 %] 96 % (03/05 0800) Arterial Line BP: (120-240)/(59-105) 186/71 mmHg (03/04 2230) FiO2 (%):  [40 %] 40 % (03/05 0740) Weight:  [54.5 kg (120 lb 2.4 oz)] 54.5 kg (120 lb 2.4 oz) (03/05 0435)  Weight change: -0.2 kg (-7.1 oz) Filed Weights   11/07/15 0500 11/08/15 0434 11/09/15 0435  Weight: 55.1 kg (121 lb 7.6 oz) 54.7 kg (120 lb 9.5 oz) 54.5 kg (120 lb 2.4 oz)    Intake/Output: I/O last 3 completed shifts: In: 4092.1 [I.V.:2102.9; NG/GT:1339.2; IV Piggyback:650] Out: 2985 [Urine:2985]   Intake/Output this shift:  Total I/O In: 83.8 [I.V.:28.8; NG/GT:55] Out: 250 [Urine:250]  CVS- RRR RS- CTA Intubated  ABD- BS present soft non-distended EXT- no edema   Basic Metabolic Panel:  Recent Labs Lab 11/05/2015 0245  11/22/2015 1437 11/26/2015 1833 11/07/15 0325 11/07/15 1649 11/08/15 0357 11/09/15 0415  NA 143  < > 144 142 141 143 140 141  K 3.9  < > 3.4* 3.5 3.8 3.8 4.0 3.5  CL 109  < > 105 102 99* 97* 98* 98*  CO2 17*  < > 28 27 26 30 28 31   GLUCOSE 140*  < > 130* 109* 98 112* 148* 140*  BUN 14  < > 21* 23* 28* 36* 43* 47*  CREATININE 1.69*  < > 2.41* 2.61* 2.92* 3.54* 3.61* 3.71*  CALCIUM 8.5*  < > 8.2* 8.0* 7.7* 7.6* 7.5* 8.2*  MG 2.0  --   --   --   --  1.6*  --   --   PHOS 5.3*  --  3.0 2.7 2.8  --   --   --   < > = values in this interval not displayed.  Liver Function Tests:  Recent Labs Lab 11/25/2015 2255 11/07/2015 1437 11/20/2015 1833 11/07/15 0325 11/08/15 0357 11/09/15 0415  AST 166*  --   --  724* 307* 114*  ALT 52  --   --  510* 414* 268*  ALKPHOS 109  --   --  61 65  59  BILITOT 0.7  --   --  2.3* 1.2 0.6  PROT 5.4*  --   --  4.1* 4.6* 4.8*  ALBUMIN 2.8* 2.0* 2.0* 2.0* 2.2* 2.1*   No results for input(s): LIPASE, AMYLASE in the last 168 hours. No results for input(s): AMMONIA in the last 168 hours.  CBC:  Recent Labs Lab 11/16/2015 2239 12/04/2015 0245 11/05/2015 0657 11/07/15 0325 11/08/15 0357 11/09/15 0415  WBC 20.9* 10.6* 7.5 13.6* 18.4* 17.6*  NEUTROABS 16.5*  --   --  12.1*  --  15.9*  HGB 12.9* 13.5 12.1* 9.8* 9.1* 9.1*  HCT 37.7* 41.1 35.1* 28.6* 26.1* 26.6*  MCV 84.2 84.0 84.0 78.8 79.3 79.9  PLT 175 177 134* 86* 77* 73*    Cardiac Enzymes:  Recent Labs Lab 11/13/2015 0245 11/29/2015 0657 11/05/2015 0958 11/09/2015 1150 11/05/2015 1833  TROPONINI 10.60* 31.57* >65.00* >65.00* >65.00*    BNP: Invalid input(s): POCBNP  CBG:  Recent Labs Lab 11/08/15 1644 11/08/15 1947 11/08/15 2322 11/09/15 0418 11/09/15 0834  GLUCAP 106* 117*  128* 131* 114*    Microbiology: Results for orders placed or performed during the hospital encounter of 11/29/2015  Blood culture (routine x 2)     Status: None (Preliminary result)   Collection Time: 11/14/2015 10:40 PM  Result Value Ref Range Status   Specimen Description BLOOD RIGHT ARM  Final   Special Requests BOTTLES DRAWN AEROBIC AND ANAEROBIC 5ML  Final   Culture NO GROWTH 3 DAYS  Final   Report Status PENDING  Incomplete  Blood culture (routine x 2)     Status: None (Preliminary result)   Collection Time: 11/10/2015 10:55 PM  Result Value Ref Range Status   Specimen Description BLOOD RIGHT HAND  Final   Special Requests IN PEDIATRIC BOTTLE 1ML  Final   Culture NO GROWTH 3 DAYS  Final   Report Status PENDING  Incomplete  MRSA PCR Screening     Status: None   Collection Time: 11/22/2015 12:54 AM  Result Value Ref Range Status   MRSA by PCR NEGATIVE NEGATIVE Final    Comment:        The GeneXpert MRSA Assay (FDA approved for NASAL specimens only), is one component of a comprehensive MRSA  colonization surveillance program. It is not intended to diagnose MRSA infection nor to guide or monitor treatment for MRSA infections.   Respiratory virus panel     Status: None   Collection Time: 11/22/2015  3:39 AM  Result Value Ref Range Status   Respiratory Syncytial Virus A Negative Negative Final   Respiratory Syncytial Virus B Negative Negative Final   Influenza A Negative Negative Final   Influenza B Negative Negative Final   Parainfluenza 1 Negative Negative Final   Parainfluenza 2 Negative Negative Final   Parainfluenza 3 Negative Negative Final   Metapneumovirus Negative Negative Final   Rhinovirus Negative Negative Final   Adenovirus Negative Negative Final    Comment: (NOTE) Performed At: [facility] [address] [place], [place] [id number] Lindon Romp MD [id number]   Urine culture     Status: None   Collection Time: 11/08/2015  3:53 AM  Result Value Ref Range Status   Specimen Description URINE, RANDOM  Final   Special Requests NONE  Final   Culture NO GROWTH 1 DAY  Final   Report Status 11/07/2015 FINAL  Final  Culture, respiratory (NON-Expectorated)     Status: None (Preliminary result)   Collection Time: 11/15/2015  6:00 AM  Result Value Ref Range Status   Specimen Description TRACHEAL ASPIRATE  Final   Special Requests NONE  Final   Gram Stain   Final    FEW WBC PRESENT,BOTH PMN AND MONONUCLEAR NO SQUAMOUS EPITHELIAL CELLS SEEN NO ORGANISMS SEEN Performed at [facility]    Culture   Final    NO GROWTH 1 DAY Performed at [facility]    Report Status PENDING  Incomplete    Coagulation Studies:  Recent Labs  11/07/15 1225 11/08/15 0800  LABPROT 26.6* 17.1*  INR 2.49* 1.39    Urinalysis: No results for input(s): COLORURINE, LABSPEC, PHURINE, GLUCOSEU, HGBUR, BILIRUBINUR, KETONESUR, PROTEINUR, UROBILINOGEN, NITRITE, LEUKOCYTESUR in the last 72 hours.  Invalid input(s): APPERANCEUR    Imaging: Dg Chest Port  1 View  11/09/2015  CLINICAL DATA:  Pneumonia, history hypertension, prostate cancer, cardiomyopathy, smoker EXAM: PORTABLE CHEST 1 VIEW COMPARISON:  Portable exam 0424 hours compared to 11/08/2015 FINDINGS: Tip of endotracheal tube projects 3.8 cm above carina. Nasogastric tube extends into abdomen. External pacing leads in  EKG leads noted. LEFT jugular central venous catheter tip projects over SVC. Minimal enlargement of cardiac silhouette with slight pulmonary vascular congestion. Mediastinal contour stable. Perihilar to basilar infiltrates bilaterally favoring pulmonary edema. No definite pleural effusion or pneumothorax. IMPRESSION: Suspected pulmonary edema, little changed. Electronically Signed   By: Lavonia Dana M.D.   On: 11/09/2015 07:05   Dg Chest Port 1 View  11/08/2015  CLINICAL DATA:  Pneumonia EXAM: PORTABLE CHEST 1 VIEW COMPARISON:  November 07, 2015 FINDINGS: The ET and NG tubes are again identified. A left central line is stable. A transcutaneous pacemaker pad is identified. An intra-aortic balloon pump is again identified with the distal tip 3.7 cm below the top of the aortic arch. This has probably been pulled back slightly in the interval. Stable cardiomegaly. No pulmonary nodules or masses. The focal opacity in the medial right lung base is improved but not completely resolve. Mild edema is suspected as well. IMPRESSION: The support apparatus is in good position. Improving focal opacity in the right infrahilar region. Mild edema. Electronically Signed   By: Dorise Bullion III M.D   On: 11/08/2015 08:03   Ct Portable Head W/o Cm  11/07/2015  CLINICAL DATA:  Altered mental status EXAM: CT HEAD WITHOUT CONTRAST TECHNIQUE: Contiguous paraaxial images were obtained from the base of the skull through the vertex without intravenous contrast. COMPARISON:  None. FINDINGS: There is mild diffuse atrophy. There is no intracranial mass, hemorrhage, extra-axial fluid collection, or midline shift. There are  lacunar type infarcts in the centra semiovale bilaterally. There is patchy small vessel disease in the centra semiovale bilaterally. There is no demonstrable acute infarct. The bony calvarium appears intact. The mastoid air cells are clear. No intraorbital lesions are evident. 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 No acute infarct evident. No hemorrhage or mass effect. Electronically Signed   By: Lowella Grip III M.D.   On: 11/07/2015 13:40     Medications:   . sodium chloride 10 mL/hr at 11/09/15 0759  . sodium chloride 10 mL/hr at 11/09/15 0800  . feeding supplement (VITAL AF 1.2 CAL) 1,000 mL (11/09/15 0800)  . fentaNYL infusion INTRAVENOUS 150 mcg/hr (11/09/15 0800)  . milrinone 0.125 mcg/kg/min (11/09/15 0800)   . antiseptic oral rinse  7 mL Mouth Rinse 10 times per day  . aspirin  81 mg Per Tube Daily  . atorvastatin  40 mg Per Tube q1800  . cefTRIAXone (ROCEPHIN)  IV  2 g Intravenous Q12H  . chlorhexidine gluconate  15 mL Mouth Rinse BID  . heparin subcutaneous  5,000 Units Subcutaneous 3 times per day  . hydrALAZINE  25 mg Per Tube 3 times per day  . insulin aspart  0-15 Units Subcutaneous 6 times per day  . isosorbide dinitrate  10 mg Per Tube TID  . pantoprazole (PROTONIX) IV  40 mg Intravenous QHS  . sodium chloride flush  10-40 mL Intracatheter Q12H  . sodium chloride flush  3 mL Intravenous Q12H   sodium chloride, Place/Maintain arterial line **AND** sodium chloride, albuterol, fentaNYL, hydrALAZINE, sodium chloride flush  Assessment/ Plan:  1 AKI presumeably ischemic ATN. Has some CKD. Now nonoliguric slow rise of creatinine Urine mild protein no active sediment I think he is stable today no indications to start dialysis 2 CKD 3 Baseline creatinine 1.6  3 Hypertension: not an issue Balloon pump  4. CM with low EF 10%   Good venous oxygen  5. S/p multilple arrests  follow MS and clinical parameter - very concerned about mental status  6 Pulm infilt  Positive for strep pneumo  -- rocephin IV 7 Prostate Ca 8ETOH abuse 10Enceph  Renal ultrasound decompressed bladder 10.3 and 10.8 cm kidney No indications for dialysis although renal function is worse   LOS: 3 [place] W @TODAY @10 :04 AM

## 2015-11-09 NOTE — H&P (Signed)
PULMONARY / CRITICAL CARE MEDICINE   Name: William Mccullough MRN: [id number] DOB: 1945-09-16    ADMISSION DATE:  11/27/2015 CONSULTATION DATE:  11/21/2015  REFERRING MD:  EDP  CHIEF COMPLAINT:  SOB  HISTORY OF PRESENT ILLNESS:  Pt is encephelopathic; therefore, this HPI is obtained from chart review. William Mccullough is a 70 y.o. male with PMH as outlined below. FOund down, MODS, cardiogenic shock.  SUBJECTIVE:  Remains off pressors Balloon pump out  VITAL SIGNS: BP 120/68 mmHg  Pulse 64  Temp(Src) 98.1 F (36.7 C) (Core (Comment))  Resp 13  Ht 5\' 9"  (1.753 m)  Wt 54.5 kg (120 lb 2.4 oz)  BMI 17.74 kg/m2  SpO2 98%  HEMODYNAMICS: CVP:  [5 mmHg-9 mmHg] 7 mmHg  VENTILATOR SETTINGS: Vent Mode:  [-] PRVC FiO2 (%):  [40 %-50 %] 40 % Set Rate:  [12 bmp] 12 bmp Vt Set:  [500 mL] 500 mL PEEP:  [5 cmH20-8 cmH20] 5 cmH20 Pressure Support:  [10 cmH20] 10 cmH20 Plateau Pressure:  [17 cmH20-25 cmH20] 17 cmH20  INTAKE / OUTPUT: I/O last 3 completed shifts: In: 4092.1 [I.V.:2102.9; NG/GT:1339.2; IV Piggyback:650] Out: 2985 [Urine:2985]   PHYSICAL EXAMINATION: General: AA male, thin, cachectic Neuro: rass -1, eyes remain open, predominant left gaze int, does blink easily HEENT: [place]/AT. PERR Cardiovascular: RRR, no M/R/G.  Lungs: distant Abdomen: BS x 4, soft, NT/ND.  Musculoskeletal: No gross deformities, no edema.  Skin: Intact, warm, no rashes.  LABS:  BMET  Recent Labs Lab 11/07/15 1649 11/08/15 0357 11/09/15 0415  NA 143 140 141  K 3.8 4.0 3.5  CL 97* 98* 98*  CO2 30 28 31   BUN 36* 43* 47*  CREATININE 3.54* 3.61* 3.71*  GLUCOSE 112* 148* 140*    Electrolytes  Recent Labs Lab 11/26/2015 0245  11/22/2015 1437 11/24/2015 1833 11/07/15 0325 11/07/15 1649 11/08/15 0357 11/09/15 0415  CALCIUM 8.5*  < > 8.2* 8.0* 7.7* 7.6* 7.5* 8.2*  MG 2.0  --   --   --   --  1.6*  --   --   PHOS 5.3*  --  3.0 2.7 2.8  --   --   --   < > = values in this interval not  displayed.  CBC  Recent Labs Lab 11/07/15 0325 11/08/15 0357 11/09/15 0415  WBC 13.6* 18.4* 17.6*  HGB 9.8* 9.1* 9.1*  HCT 28.6* 26.1* 26.6*  PLT 86* 77* 73*    Coag's  Recent Labs Lab 11/07/2015 2255 11/07/15 1225 11/08/15 0800  APTT  --  >200*  --   INR 1.22 2.49* 1.39    Sepsis Markers  Recent Labs Lab 11/18/2015 0120 11/24/2015 0245 11/07/15 0325 11/07/15 1225 11/07/15 1600 11/08/15 0357  LATICACIDVEN 6.5* 7.5*  --  2.7* 2.6*  --   PROCALCITON 1.66  --  >175.00  --   --  149.51    ABG  Recent Labs Lab 11/08/15 0835 11/08/15 0845 11/08/15 2209  PHART 7.380 7.630* 7.424  PCO2ART 56.1* 25.4* 48.8*  PO2ART 43.0* 32.0* 89.3    Liver Enzymes  Recent Labs Lab 11/07/15 0325 11/08/15 0357 11/09/15 0415  AST 724* 307* 114*  ALT 510* 414* 268*  ALKPHOS 61 65 59  BILITOT 2.3* 1.2 0.6  ALBUMIN 2.0* 2.2* 2.1*    Cardiac Enzymes  Recent Labs Lab 12/04/2015 0958 11/05/2015 1150 11/26/2015 1833  TROPONINI >65.00* >65.00* >65.00*    Glucose  Recent Labs Lab 11/08/15 0857 11/08/15 1200 11/08/15 1644 11/08/15 1947 11/08/15 2322  11/09/15 0418  GLUCAP 158* 100* 106* 117* 128* 131*    Imaging Dg Chest Port 1 View  11/09/2015  CLINICAL DATA:  Pneumonia, history hypertension, prostate cancer, cardiomyopathy, smoker EXAM: PORTABLE CHEST 1 VIEW COMPARISON:  Portable exam 0424 hours compared to 11/08/2015 FINDINGS: Tip of endotracheal tube projects 3.8 cm above carina. Nasogastric tube extends into abdomen. External pacing leads in EKG leads noted. LEFT jugular central venous catheter tip projects over SVC. Minimal enlargement of cardiac silhouette with slight pulmonary vascular congestion. Mediastinal contour stable. Perihilar to basilar infiltrates bilaterally favoring pulmonary edema. No definite pleural effusion or pneumothorax. IMPRESSION: Suspected pulmonary edema, little changed. Electronically Signed   By: Lavonia Dana M.D.   On: 11/09/2015 07:05    STUDIES:  CXR 03/01 > cardiomegaly with asymmetric interstitial edema right side. Echo pa 33, EF global 15% Cath neg eeg 3/3 >>>diffuse cerebral dysfunction that is non-specific in etiology and can be seen with hypoxic/ischemic injury, toxic/metabolic encephalopathies, or medication effect from Fentanyl Ct head 3/3>>> Atrophy with prior small basal ganglia infarcts bilaterally as well as patchy periventricular small vessel disease. No acute infarct  CULTURES: Blood 03/01 >>> Urine 03/01 >>> Sputum 03/01 >>> Leg>>>neg Strep>>>POS  ANTIBIOTICS: Vanc 03/02 >>>3/5 Zosyn 03/02 >>>3/3 cefriaxone 3/3>>>  SIGNIFICANT EVENTS: 03/02 > admitted with acute hypoxic respiratory failure requiring intubation.  Had brief code post intubation. 3/2- balloon pump, renal failure 3/4 - off pressors, balloon pump out  LINES/TUBES: ETT 03/02 > Left IJ 3/2>>> Aline 3/2>>>3/4   ASSESSMENT / PLAN:  PULMONARY A: Acute hypoxic respiratory failure - required intubation in ED. PNA-CAP P:   On min support, wean cpap 5 ps5, goal 2 hr Assess rsbi, cough, resp strength likley need improved neuro to extubate however pcxr in am   CARDIOVASCULAR A:  Septic shock, septic cardiomyoapthy Brief cardiac arrest post intubation - ROSC after 1 - 2 min Troponin leak - suspect due to demand ischemia. Hx HTN, cardiomyopathy (last echo from 2013 with EF 40-45%, grade 1DD.  Of note, per EDP, bedside cardiac POCUS revealed very poor EF estimated at 10-15%). P:  Balloon pump, out Milrinone reduced per cards Consider even to neg balance today lasix  RENAL A:   AKI, ATN AGMA - lactate. POS balance, chf P:   KVo status Chem in am  Lasix to even may be required  GASTROINTESTINAL A:   GI prophylaxis. Nutrition. Shock liver P:   SUP: Pantoprazole. TF to goal Last BM yestedray   HEMATOLOGIC / ONCOLOGIC A:   VTE Prophylaxis. Hx prostate CA - last seen in 2013 and was doing well at that  time. Thrombocytopenia *(sepsis) R/o DIC component- neg Coagulapathy improved P:  SCD's Sub q hep add, follow trend plat  INFECTIOUS A:   Septic shock, PAN, strep pos ag P:   Consider dc vanc  today Maintain cefriaxone, consider 8 days  ENDOCRINE A:   Hyperglycemia - no hx DM. P:   SSI Get cortisol 15, if dropped BP, add stress roids  NEUROLOGIC A:   Acute metabolic encephalopathy. Hx anxiety, depression. At risk meningitis? POor baseline status P:   Sedation:  Fentanyl gtt Dc versed RASS goal: 0  Daily WUA Unable to LP now and plat noted and now low yield Treat BBB empiric Get MRI  Family updated: Niece updated 3/4  Interdisciplinary Family Meeting v Palliative Care Meeting:  Due by: 03/08.  CC time: 30 minutes   Lavon Paganini. Titus Mould, MD, [place] Pgr: [facility]

## 2015-11-09 NOTE — Progress Notes (Signed)
Spoke with pt's nephew today when visited,family has tried to get intouch with pts  3 children. One dtr was reached in [facility] and didn't want anything to do with decisions and will not be visiting. The other 2 children havent been reached. Nephew to ask Pam (niece) to come tomorrow to speak to MD. Pt lives with 68 year old sister (who is Pam's mother).

## 2015-11-10 ENCOUNTER — Inpatient Hospital Stay (HOSPITAL_COMMUNITY): Payer: Medicare Other

## 2015-11-10 LAB — COMPREHENSIVE METABOLIC PANEL
ALK PHOS: 63 U/L (ref 38–126)
ALT: 163 U/L — ABNORMAL HIGH (ref 17–63)
ANION GAP: 9 (ref 5–15)
AST: 48 U/L — ABNORMAL HIGH (ref 15–41)
Albumin: 2 g/dL — ABNORMAL LOW (ref 3.5–5.0)
BILIRUBIN TOTAL: 0.6 mg/dL (ref 0.3–1.2)
BUN: 45 mg/dL — ABNORMAL HIGH (ref 6–20)
CALCIUM: 8 mg/dL — AB (ref 8.9–10.3)
CO2: 32 mmol/L (ref 22–32)
Chloride: 102 mmol/L (ref 101–111)
Creatinine, Ser: 3.56 mg/dL — ABNORMAL HIGH (ref 0.61–1.24)
GFR calc non Af Amer: 16 mL/min — ABNORMAL LOW (ref >60)
GFR, EST AFRICAN AMERICAN: 19 mL/min — AB (ref >60)
Glucose, Bld: 101 mg/dL — ABNORMAL HIGH (ref 65–99)
Potassium: 3.5 mmol/L (ref 3.5–5.1)
SODIUM: 143 mmol/L (ref 135–145)
TOTAL PROTEIN: 4.6 g/dL — AB (ref 6.5–8.1)

## 2015-11-10 LAB — CBC
HEMATOCRIT: 25.3 % — AB (ref 39.0–52.0)
HEMOGLOBIN: 8.5 g/dL — AB (ref 13.0–17.0)
MCH: 27.1 pg (ref 26.0–34.0)
MCHC: 33.6 g/dL (ref 30.0–36.0)
MCV: 80.6 fL (ref 78.0–100.0)
Platelets: 70 10*3/uL — ABNORMAL LOW (ref 150–400)
RBC: 3.14 MIL/uL — AB (ref 4.22–5.81)
RDW: 14 % (ref 11.5–15.5)
WBC: 11.9 10*3/uL — AB (ref 4.0–10.5)

## 2015-11-10 LAB — CBC WITH DIFFERENTIAL/PLATELET
BASOS ABS: 0 10*3/uL (ref 0.0–0.1)
BASOS PCT: 0 %
Eosinophils Absolute: 0.1 10*3/uL (ref 0.0–0.7)
Eosinophils Relative: 1 %
HEMATOCRIT: 23.8 % — AB (ref 39.0–52.0)
HEMOGLOBIN: 8.2 g/dL — AB (ref 13.0–17.0)
Lymphocytes Relative: 11 %
Lymphs Abs: 1.5 10*3/uL (ref 0.7–4.0)
MCH: 27.8 pg (ref 26.0–34.0)
MCHC: 34.5 g/dL (ref 30.0–36.0)
MCV: 80.7 fL (ref 78.0–100.0)
Monocytes Absolute: 0.6 10*3/uL (ref 0.1–1.0)
Monocytes Relative: 4 %
NEUTROS ABS: 12 10*3/uL — AB (ref 1.7–7.7)
NEUTROS PCT: 85 %
Platelets: 67 10*3/uL — ABNORMAL LOW (ref 150–400)
RBC: 2.95 MIL/uL — AB (ref 4.22–5.81)
RDW: 14.2 % (ref 11.5–15.5)
WBC: 14.2 10*3/uL — ABNORMAL HIGH (ref 4.0–10.5)

## 2015-11-10 LAB — PROTIME-INR
INR: 1.15 (ref 0.00–1.49)
Prothrombin Time: 14.9 seconds (ref 11.6–15.2)

## 2015-11-10 LAB — GLUCOSE, CAPILLARY
GLUCOSE-CAPILLARY: 130 mg/dL — AB (ref 65–99)
GLUCOSE-CAPILLARY: 92 mg/dL (ref 65–99)
Glucose-Capillary: 120 mg/dL — ABNORMAL HIGH (ref 65–99)
Glucose-Capillary: 176 mg/dL — ABNORMAL HIGH (ref 65–99)
Glucose-Capillary: 104 mg/dL — ABNORMAL HIGH (ref 65–99)

## 2015-11-10 LAB — CARBOXYHEMOGLOBIN
CARBOXYHEMOGLOBIN: 1.7 % — AB (ref 0.5–1.5)
Methemoglobin: 0.9 % (ref 0.0–1.5)
O2 SAT: 74 %
TOTAL HEMOGLOBIN: 8.5 g/dL — AB (ref 13.5–18.0)

## 2015-11-10 LAB — CULTURE, BLOOD (ROUTINE X 2)
CULTURE: NO GROWTH
CULTURE: NO GROWTH

## 2015-11-10 LAB — APTT: APTT: 34 s (ref 24–37)

## 2015-11-10 MED ORDER — MIDAZOLAM HCL 2 MG/2ML IJ SOLN
2.0000 mg | Freq: Once | INTRAMUSCULAR | Status: AC
  Administered 2015-11-10: 2 mg via INTRAVENOUS
  Filled 2015-11-10: qty 2

## 2015-11-10 MED ORDER — DEXMEDETOMIDINE HCL IN NACL 200 MCG/50ML IV SOLN
0.0000 ug/kg/h | INTRAVENOUS | Status: DC
  Administered 2015-11-10: 0.4 ug/kg/h via INTRAVENOUS
  Administered 2015-11-10: 0.2 ug/kg/h via INTRAVENOUS
  Administered 2015-11-11: 0.4 ug/kg/h via INTRAVENOUS
  Administered 2015-11-12 (×3): 0.5 ug/kg/h via INTRAVENOUS
  Filled 2015-11-10 (×7): qty 50

## 2015-11-10 MED ORDER — ISOSORBIDE DINITRATE 10 MG PO TABS
20.0000 mg | ORAL_TABLET | Freq: Three times a day (TID) | ORAL | Status: DC
  Administered 2015-11-10 – 2015-11-18 (×26): 20 mg
  Filled 2015-11-10 (×27): qty 2

## 2015-11-10 MED ORDER — HYDRALAZINE HCL 25 MG PO TABS
37.5000 mg | ORAL_TABLET | Freq: Three times a day (TID) | ORAL | Status: DC
  Administered 2015-11-10 – 2015-11-18 (×15): 37.5 mg
  Filled 2015-11-10 (×17): qty 2

## 2015-11-10 NOTE — Progress Notes (Signed)
PCCM INTERVAL PROGRESS NOTE  Called to bedside to evaluate patient in acute distress. Tachycardic and hypertensive on vent. Agitated despite fentanyl gtt.  Also with 52mL frank blood from ETT suctioning. Has been on heparin SQ but dropped platelets. HIT panel pending. Will repeat PLT and INR. If low can correct. Will add precedex infusion to help with agitation and hopefully will improve BP and HR.   Georgann Housekeeper, AGACNP-BC [facility] Pulmonology/Critical Care Pager [phone] or [phone]  11/10/2015 4:20 PM

## 2015-11-10 NOTE — Progress Notes (Signed)
PULMONARY / CRITICAL CARE MEDICINE   Name: William Mccullough MRN: [id number] DOB: 25-Nov-1945    ADMISSION DATE:  11/18/2015 CONSULTATION DATE:  11/25/2015  REFERRING MD:  EDP  CHIEF COMPLAINT:  SOB  HISTORY OF PRESENT ILLNESS:  Pt is encephelopathic; therefore, this HPI is obtained from chart review. William Mccullough is a 70 y.o. male with PMH as outlined below. Found down, MODS, cardiogenic shock.  SUBJECTIVE:  Off pressors NO acute event ovenight  VITAL SIGNS: BP 108/51 mmHg  Pulse 78  Temp(Src) 99.5 F (37.5 C) (Core (Comment))  Resp 12  Ht 5\' 9"  (1.753 m)  Wt 121 lb 7.6 oz (55.1 kg)  BMI 17.93 kg/m2  SpO2 98%  HEMODYNAMICS: CVP:  [4 mmHg-9 mmHg] 6 mmHg  VENTILATOR SETTINGS: Vent Mode:  [-] PRVC FiO2 (%):  [40 %] 40 % Set Rate:  [12 bmp] 12 bmp Vt Set:  [500 mL] 500 mL PEEP:  [5 cmH20] 5 cmH20 Plateau Pressure:  [13 cmH20-24 cmH20] 24 cmH20  INTAKE / OUTPUT: I/O last 3 completed shifts: In: 3492.2 [I.V.:1192.2; NG/GT:2150; IV Piggyback:150] Out: 2575 [Urine:2575]   PHYSICAL EXAMINATION: General: AA male, No purposeful response to commands Neuro: Moves all four extremities.  HEENT: No thyromegaly or JVD,  Cardiovascular: RRR, no MRG Lungs: Clear, no MRG Abdomen: Soft, + BS, NT/ND Musculoskeletal: No gross deformities, no edema.  Skin: No rashes.  LABS:  BMET  Recent Labs Lab 11/08/15 0357 11/09/15 0415 11/10/15 0447  NA 140 141 143  K 4.0 3.5 3.5  CL 98* 98* 102  CO2 28 31 32  BUN 43* 47* 45*  CREATININE 3.61* 3.71* 3.56*  GLUCOSE 148* 140* 101*    Electrolytes  Recent Labs Lab 12/04/2015 0245  11/14/2015 1437 11/11/2015 1833 11/07/15 0325 11/07/15 1649 11/08/15 0357 11/09/15 0415 11/10/15 0447  CALCIUM 8.5*  < > 8.2* 8.0* 7.7* 7.6* 7.5* 8.2* 8.0*  MG 2.0  --   --   --   --  1.6*  --   --   --   PHOS 5.3*  --  3.0 2.7 2.8  --   --   --   --   < > = values in this interval not displayed.  CBC  Recent Labs Lab 11/08/15 0357  11/09/15 0415 11/10/15 0447  WBC 18.4* 17.6* 14.2*  HGB 9.1* 9.1* 8.2*  HCT 26.1* 26.6* 23.8*  PLT 77* 73* 67*    Coag's  Recent Labs Lab 11/15/2015 2255 11/07/15 1225 11/08/15 0800  APTT  --  >200*  --   INR 1.22 2.49* 1.39    Sepsis Markers  Recent Labs Lab 12/02/2015 0120 11/05/2015 0245 11/07/15 0325 11/07/15 1225 11/07/15 1600 11/08/15 0357  LATICACIDVEN 6.5* 7.5*  --  2.7* 2.6*  --   PROCALCITON 1.66  --  >175.00  --   --  149.51    ABG  Recent Labs Lab 11/08/15 0835 11/08/15 0845 11/08/15 2209  PHART 7.380 7.630* 7.424  PCO2ART 56.1* 25.4* 48.8*  PO2ART 43.0* 32.0* 89.3    Liver Enzymes  Recent Labs Lab 11/08/15 0357 11/09/15 0415 11/10/15 0447  AST 307* 114* 48*  ALT 414* 268* 163*  ALKPHOS 65 59 63  BILITOT 1.2 0.6 0.6  ALBUMIN 2.2* 2.1* 2.0*    Cardiac Enzymes  Recent Labs Lab 11/28/2015 0958 11/28/2015 1150 11/16/2015 1833  TROPONINI >65.00* >65.00* >65.00*    Glucose  Recent Labs Lab 11/09/15 1227 11/09/15 1634 11/09/15 2052 11/10/15 0017 11/10/15 0431 11/10/15 0809  GLUCAP  180* 97 152* 130* 35 120*    Imaging Mr Brain Wo Contrast  11/10/2015  CLINICAL DATA:  Initial evaluation for acute stroke. EXAM: MRI HEAD WITHOUT CONTRAST TECHNIQUE: Multiplanar, multiecho pulse sequences of the brain and surrounding structures were obtained without intravenous contrast. COMPARISON:  Prior CT from 11/07/2015. FINDINGS: Study mildly degraded by motion artifact. Diffuse prominence of the CSF containing spaces compatible with generalized cerebral atrophy. Patchy and confluent T2/FLAIR hyperintensity within the periventricular and deep white matter both cerebral hemispheres most consistent with chronic small vessel ischemic disease, moderate nature. Multiple superimposed remote lacunar infarcts involve the bilateral basal ganglia and thalami. Probable small remote lacunar infarcts within the pons as well. Confluent area of restricted diffusion within  the high left frontal parietal region measures 11 x 17 x 13 mm. This demonstrates central decreased ADC signal, consistent with acute/ early subacute ischemic infarct. Probable subtle associated petechial hemorrhage without hemorrhagic transformation (series 8, image 19). Few smaller additional punctate cortical infarcts present within the left frontal parietal region. Small punctate cortical infarct more posteriorly within the left parietal lobe (Series 4, image 32). Scattered largely cortical ischemic infarcts within the bilateral occipital regions (Series 4, image 21, 20 in). There are patchy multi focal ischemic infarcts involving the bilateral cerebellar hemispheres, left worse than right. Punctate infarcts extend along the midline in the cerebellar vermis as well. Corresponding ADC signal about these infarcts appears to largely be resolving at this time, suggesting at these are early subacute in nature. Probable faint associated petechial hemorrhage within the left cerebellar hemisphere without hemorrhagic transformation (series 8, image 5). There is associated T2/FLAIR signal intensity about these infarcts with minimal localized edema without significant mass effect. Major intracranial vascular flow voids are maintained. No mass lesion, midline shift, or mass effect. Ventricular prominence related to global parenchymal volume loss present without hydrocephalus. No extra-axial fluid collection. Major dural sinuses are patent. Craniocervical junction within normal limits. Visualized upper cervical spine unremarkable. Pituitary gland normal.  No acute abnormality about the orbits. Scattered mucosal thickening within the ethmoidal air cells and sphenoid sinuses. Fluid within the nasopharynx. Patient likely is intubated. Scattered opacity within the bilateral mastoid air cells. Inner ear structures grossly normal. Bone marrow signal intensity within normal limits. No scalp soft tissue abnormality. IMPRESSION: 1.  Scattered acute/early subacute ischemic infarcts involving the left frontoparietal region, the bilateral occipital lobes, and bilateral cerebellar hemispheres as above. No significant mass effect. Probable faint petechial hemorrhage with the dominant left frontoparietal and left cerebellar infarcts as above. No evidence for frank hemorrhagic transformation. 2. Advanced cerebral atrophy with moderate chronic small vessel ischemic disease and multiple remote lacunar infarcts involving the bilateral basal ganglia, thalami, and pons. Electronically Signed   By: Jeannine Boga M.D.   On: 11/10/2015 02:57   Dg Chest Port 1 View  11/10/2015  CLINICAL DATA:  70 year old male with respiratory failure, intubated. Cerebral infarcts. Initial encounter. EXAM: PORTABLE CHEST 1 VIEW COMPARISON:  11/09/2015 and earlier. FINDINGS: Portable AP semi upright view at 0449 hours. Stable endotracheal tube tip between the level the clavicles and carina. Enteric tube courses to the left upper quadrant, tip not included. Stable left IJ central line. Pacer/resuscitation pads have been removed. Right mid and lower lung veiling opacity has regressed since 11/16/2015. There is persistent dense left lung base opacity which obscures the left hemidiaphragm and has mildly progressed since 11/08/2015. No pneumothorax. Pulmonary vascularity is within normal limits. IMPRESSION: 1.  Stable lines and tubes. 2. Left lower lobe collapse  or consolidation appears progressed since 11/08/2015. 3. Improved right lung base ventilation since 11/13/2015. Electronically Signed   By: Genevie Ann M.D.   On: 11/10/2015 07:11   STUDIES:  CXR 03/01 > cardiomegaly with asymmetric interstitial edema right side. Echo pa 33, EF global 15% Cath neg eeg 3/3 >>>diffuse cerebral dysfunction that is non-specific in etiology and can be seen with hypoxic/ischemic injury, toxic/metabolic encephalopathies, or medication effect from Fentanyl Ct head 3/3>>> Atrophy with  prior small basal ganglia infarcts bilaterally as well as patchy periventricular small vessel disease. No acute infarct MRI head 3/5 >> Multiple B/L acute/subacute infarcts, petechial hemorrhage.  CULTURES: Blood 03/01 >>> Urine 03/01 >>> Sputum 03/01 >>> Leg>>>neg Strep>>>POS  ANTIBIOTICS: Vanc 03/02 >>>3/5 Zosyn 03/02 >>>3/3 cefriaxone 3/3>>>  SIGNIFICANT EVENTS: 03/02 > admitted with acute hypoxic respiratory failure requiring intubation.  Had brief code post intubation. 3/2- balloon pump, renal failure 3/4 - off pressors, balloon pump out  LINES/TUBES: ETT 03/02 > Left IJ 3/2>>> Aline 3/2>>>3/4  ASSESSMENT / PLAN:  PULMONARY A: Acute hypoxic respiratory failure - required intubation in ED. PNA-CAP P:   On min support Daily wake up and breath assessments Poor mental status is a barrier to weaning  CARDIOVASCULAR A:  Septic shock, septic cardiomyoapthy Brief cardiac arrest post intubation - ROSC after 1 - 2 min Troponin leak - suspect due to demand ischemia. Hx HTN, cardiomyopathy (last echo from 2013 with EF 40-45%, grade 1DD.  Of note, per EDP, bedside cardiac POCUS revealed very poor EF estimated at 10-15%). P:  Balloon pump, out Off milrinone per cards Lasix as needed to keep I/O even  RENAL A:   AKI, ATN AGMA - lactate. POS balance, chf P:   No acute need for dialysis Monitor urine output and Cr.   GASTROINTESTINAL A:   GI prophylaxis. Nutrition. Shock liver >> Improving LFTs P:   SUP: Pantoprazole. TF to goal  HEMATOLOGIC / ONCOLOGIC A:   VTE Prophylaxis. Hx prostate CA - last seen in 2013 and was doing well at that time. Thrombocytopenia *(sepsis) R/o DIC component- neg Coagulapathy improved P:  SCD's Off S.Q heparin given the falling platelets. Follow HIT panel  INFECTIOUS A:   Septic shock, PAN, strep pos ag P:   Maintain cefriaxone, consider 8 days  ENDOCRINE A:   Hyperglycemia - no hx DM. P:   SSI Cortisol 15. Will  use stress dose steroids if hypotensive.   NEUROLOGIC A:   Acute metabolic encephalopathy. ? Anoxic injury Multiple embolic strokes.  Hx anxiety, depression. At risk meningitis? Poor baseline status P:   Sedation:   Wean fentanyl gtt Off versed RASS goal: 0  Daily WUA Unable to LP now as low plateelts and is low yield. 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  MRI results noted. Check carotid US  Family updated: Niece updated 3/4. Will try to get in touch with family again. 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 I dont think he is a good candidate for a trach given the underlying poor functional status.   Interdisciplinary Family Meeting v Palliative Care Meeting:  Due by: 03/08.  CC time: 35 minutes  Marshell Garfinkel MD [facility] Pager [phone] If no answer or after 3pm call: [phone] 11/10/2015, 11:22 AM

## 2015-11-10 NOTE — Progress Notes (Signed)
Patient ID: William Mccullough, male   DOB: 1946-02-29, 70 y.o.   MRN: [id number]   SUBJECTIVE: Patient remains intubated, sedated.   3/2: Respiratory arrest with intubation followed by PEA arrest x 2.  IABP placed.  Pressors begun.  CXR with RLL PNA. PCT > 175. Strep pneumo urine antigen+, flu -.  Troponin > 65   3/3: IABP out.   Echo: EF 15-20% with decreased RV function.   Remains on vent. Sedated. Awakens but doesn't follow commands. Off norepinephrine and vasopressin. Remains on milrinone 0.125. Co-ox 74%, CVP 4.    Scheduled Meds: . antiseptic oral rinse  7 mL Mouth Rinse 10 times per day  . aspirin  81 mg Per Tube Daily  . atorvastatin  40 mg Per Tube q1800  . cefTRIAXone (ROCEPHIN)  IV  2 g Intravenous Q12H  . chlorhexidine gluconate  15 mL Mouth Rinse BID  . heparin subcutaneous  5,000 Units Subcutaneous 3 times per day  . hydrALAZINE  37.5 mg Per Tube 3 times per day  . insulin aspart  0-15 Units Subcutaneous 6 times per day  . isosorbide dinitrate  20 mg Per Tube TID  . pantoprazole (PROTONIX) IV  40 mg Intravenous QHS  . sodium chloride flush  10-40 mL Intracatheter Q12H  . sodium chloride flush  3 mL Intravenous Q12H   Continuous Infusions: . sodium chloride Stopped (11/09/15 0800)  . sodium chloride 10 mL/hr at 11/10/15 0203  . feeding supplement (VITAL AF 1.2 CAL) 1,000 mL (11/10/15 0020)  . fentaNYL infusion INTRAVENOUS 200 mcg/hr (11/10/15 0252)   PRN Meds:.sodium chloride, Place/Maintain arterial line **AND** sodium chloride, albuterol, fentaNYL, hydrALAZINE, sodium chloride flush   Filed Vitals:   11/10/15 0435 11/10/15 0500 11/10/15 0600 11/10/15 0712  BP:  122/59 117/54 137/61  Pulse:  76 73 85  Temp:  99.9 F (37.7 C) 99.9 F (37.7 C) 100 F (37.8 C)  TempSrc:      Resp:  9 12 12   Height:      Weight: 121 lb 7.6 oz (55.1 kg)     SpO2:  98% 98% 93%    Intake/Output Summary (Last 24 hours) at 11/10/15 0759 Last data filed at 11/10/15 0600  Gross  per 24 hour  Intake 2240.42 ml  Output   1725 ml  Net 515.42 ml    LABS: Basic Metabolic Panel:  Recent Labs  11/07/15 1649  11/09/15 0415 11/10/15 0447  NA 143  < > 141 143  K 3.8  < > 3.5 3.5  CL 97*  < > 98* 102  CO2 30  < > 31 32  GLUCOSE 112*  < > 140* 101*  BUN 36*  < > 47* 45*  CREATININE 3.54*  < > 3.71* 3.56*  CALCIUM 7.6*  < > 8.2* 8.0*  MG 1.6*  --   --   --   < > = values in this interval not displayed. Liver Function Tests:  Recent Labs  11/09/15 0415 11/10/15 0447  AST 114* 48*  ALT 268* 163*  ALKPHOS 59 63  BILITOT 0.6 0.6  PROT 4.8* 4.6*  ALBUMIN 2.1* 2.0*   No results for input(s): LIPASE, AMYLASE in the last 72 hours. CBC:  Recent Labs  11/09/15 0415 11/10/15 0447  WBC 17.6* 14.2*  NEUTROABS 15.9* 12.0*  HGB 9.1* 8.2*  HCT 26.6* 23.8*  MCV 79.9 80.7  PLT 73* 67*   Cardiac Enzymes: No results for input(s): CKTOTAL, CKMB, CKMBINDEX, TROPONINI in the last  72 hours. BNP: Invalid input(s): POCBNP D-Dimer: No results for input(s): DDIMER in the last 72 hours. Hemoglobin A1C: No results for input(s): HGBA1C in the last 72 hours. Fasting Lipid Panel: No results for input(s): CHOL, HDL, LDLCALC, TRIG, CHOLHDL, LDLDIRECT in the last 72 hours. Thyroid Function Tests: No results for input(s): TSH, T4TOTAL, T3FREE, THYROIDAB in the last 72 hours.  Invalid input(s): FREET3 Anemia Panel: No results for input(s): VITAMINB12, FOLATE, FERRITIN, TIBC, IRON, RETICCTPCT in the last 72 hours.  RADIOLOGY: Mr Herby Abraham Contrast  11/10/2015  CLINICAL DATA:  Initial evaluation for acute stroke. EXAM: MRI HEAD WITHOUT CONTRAST TECHNIQUE: Multiplanar, multiecho pulse sequences of the brain and surrounding structures were obtained without intravenous contrast. COMPARISON:  Prior CT from 11/07/2015. FINDINGS: Study mildly degraded by motion artifact. Diffuse prominence of the CSF containing spaces compatible with generalized cerebral atrophy. Patchy and  confluent T2/FLAIR hyperintensity within the periventricular and deep white matter both cerebral hemispheres most consistent with chronic small vessel ischemic disease, moderate nature. Multiple superimposed remote lacunar infarcts involve the bilateral basal ganglia and thalami. Probable small remote lacunar infarcts within the pons as well. Confluent area of restricted diffusion within the high left frontal parietal region measures 11 x 17 x 13 mm. This demonstrates central decreased ADC signal, consistent with acute/ early subacute ischemic infarct. Probable subtle associated petechial hemorrhage without hemorrhagic transformation (series 8, image 19). Few smaller additional punctate cortical infarcts present within the left frontal parietal region. Small punctate cortical infarct more posteriorly within the left parietal lobe (Series 4, image 32). Scattered largely cortical ischemic infarcts within the bilateral occipital regions (Series 4, image 21, 20 in). There are patchy multi focal ischemic infarcts involving the bilateral cerebellar hemispheres, left worse than right. Punctate infarcts extend along the midline in the cerebellar vermis as well. Corresponding ADC signal about these infarcts appears to largely be resolving at this time, suggesting at these are early subacute in nature. Probable faint associated petechial hemorrhage within the left cerebellar hemisphere without hemorrhagic transformation (series 8, image 5). There is associated T2/FLAIR signal intensity about these infarcts with minimal localized edema without significant mass effect. Major intracranial vascular flow voids are maintained. No mass lesion, midline shift, or mass effect. Ventricular prominence related to global parenchymal volume loss present without hydrocephalus. No extra-axial fluid collection. Major dural sinuses are patent. Craniocervical junction within normal limits. Visualized upper cervical spine unremarkable. Pituitary  gland normal.  No acute abnormality about the orbits. Scattered mucosal thickening within the ethmoidal air cells and sphenoid sinuses. Fluid within the nasopharynx. Patient likely is intubated. Scattered opacity within the bilateral mastoid air cells. Inner ear structures grossly normal. Bone marrow signal intensity within normal limits. No scalp soft tissue abnormality. IMPRESSION: 1. Scattered acute/early subacute ischemic infarcts involving the left frontoparietal region, the bilateral occipital lobes, and bilateral cerebellar hemispheres as above. No significant mass effect. Probable faint petechial hemorrhage with the dominant left frontoparietal and left cerebellar infarcts as above. No evidence for frank hemorrhagic transformation. 2. Advanced cerebral atrophy with moderate chronic small vessel ischemic disease and multiple remote lacunar infarcts involving the bilateral basal ganglia, thalami, and pons. Electronically Signed   By: Jeannine Boga M.D.   On: 11/10/2015 02:57   US Renal  11/28/2015  CLINICAL DATA:  Acute renal injury EXAM: RENAL / URINARY TRACT ULTRASOUND COMPLETE COMPARISON:  None. FINDINGS: Right Kidney: Length: 10.8 cm. Echogenicity within normal limits. No mass or hydronephrosis visualized. Left Kidney: Length: 10.3 cm. No hydronephrosis is noted. Calcifications  are identified within the renal pelvis consistent with renal calculi. The largest of these measures 9 mm. Bladder: Decompressed by Foley catheter. IMPRESSION: Left renal calculi without acute abnormality. Electronically Signed   By: Inez Catalina M.D.   On: 12/02/2015 18:54   Dg Chest Port 1 View  11/10/2015  CLINICAL DATA:  70 year old male with respiratory failure, intubated. Cerebral infarcts. Initial encounter. EXAM: PORTABLE CHEST 1 VIEW COMPARISON:  11/09/2015 and earlier. FINDINGS: Portable AP semi upright view at 0449 hours. Stable endotracheal tube tip between the level the clavicles and carina. Enteric tube  courses to the left upper quadrant, tip not included. Stable left IJ central line. Pacer/resuscitation pads have been removed. Right mid and lower lung veiling opacity has regressed since 11/24/2015. There is persistent dense left lung base opacity which obscures the left hemidiaphragm and has mildly progressed since 11/08/2015. No pneumothorax. Pulmonary vascularity is within normal limits. IMPRESSION: 1.  Stable lines and tubes. 2. Left lower lobe collapse or consolidation appears progressed since 11/08/2015. 3. Improved right lung base ventilation since 12/04/2015. Electronically Signed   By: Genevie Ann M.D.   On: 11/10/2015 07:11   Dg Chest Port 1 View  11/09/2015  CLINICAL DATA:  Pneumonia, history hypertension, prostate cancer, cardiomyopathy, smoker EXAM: PORTABLE CHEST 1 VIEW COMPARISON:  Portable exam 0424 hours compared to 11/08/2015 FINDINGS: Tip of endotracheal tube projects 3.8 cm above carina. Nasogastric tube extends into abdomen. External pacing leads in EKG leads noted. LEFT jugular central venous catheter tip projects over SVC. Minimal enlargement of cardiac silhouette with slight pulmonary vascular congestion. Mediastinal contour stable. Perihilar to basilar infiltrates bilaterally favoring pulmonary edema. No definite pleural effusion or pneumothorax. IMPRESSION: Suspected pulmonary edema, little changed. Electronically Signed   By: Lavonia Dana M.D.   On: 11/09/2015 07:05   Dg Chest Port 1 View  11/08/2015  CLINICAL DATA:  Pneumonia EXAM: PORTABLE CHEST 1 VIEW COMPARISON:  November 07, 2015 FINDINGS: The ET and NG tubes are again identified. A left central line is stable. A transcutaneous pacemaker pad is identified. An intra-aortic balloon pump is again identified with the distal tip 3.7 cm below the top of the aortic arch. This has probably been pulled back slightly in the interval. Stable cardiomegaly. No pulmonary nodules or masses. The focal opacity in the medial right lung base is improved but  not completely resolve. Mild edema is suspected as well. IMPRESSION: The support apparatus is in good position. Improving focal opacity in the right infrahilar region. Mild edema. Electronically Signed   By: Dorise Bullion III M.D   On: 11/08/2015 08:03   Dg Chest Port 1 View  11/07/2015  CLINICAL DATA:  Respiratory failure. EXAM: PORTABLE CHEST 1 VIEW COMPARISON:  11/26/2015. FINDINGS: Endotracheal tube, NG tube, left IJ line stable position. Balloon pump in stable position. Cardiomegaly with normal pulmonary vascularity. Right lower lobe infiltrate consistent pneumonia. No pleural effusion or pneumothorax . IMPRESSION: 1. Lines and tubes stable position. Balloon pump in stable position. 2. Persistent right lower lobe infiltrate consistent pneumonia. Similar findings noted on prior exam. 3. Cardiomegaly.  No pulmonary venous congestion . Electronically Signed   By: Marcello Moores  Register   On: 11/07/2015 07:14   Dg Chest Port 1 View  11/30/2015  CLINICAL DATA:  Evaluate intra-aortic balloon pump placement. EXAM: PORTABLE CHEST 1 VIEW COMPARISON:  11/29/2015 at 8:08 a.m. FINDINGS: The metallic portion of the intra-aortic balloon pump is superimposed upon the inferior margin of the aortic knob, well positioned. Endotracheal tube, left  internal jugular central venous line and oral/ nasogastric tube are stable. There is improved right lung aeration when compared the prior study. There is significantly less airspace opacification. Residual airspace opacification is noted at the right base. IMPRESSION: 1. Metal tip of the intra-aortic balloon pump projects along the inferior margin of the aortic knob. 2. Significant improvement in right lung aeration since the earlier exam. No new lung abnormalities. Electronically Signed   By: Lajean Manes M.D.   On: 11/29/2015 19:16   Dg Chest Port 1 View  11/21/2015  CLINICAL DATA:  Central line placement EXAM: PORTABLE CHEST 1 VIEW COMPARISON:  Chest x-ray from earlier same day and  from 11/24/2015. FINDINGS: Heart size is stable. Overall cardiomediastinal silhouette is stable in size and configuration. Endotracheal tube remains well positioned with tip approximately 3 cm above the level of the carina. Nasogastric tube passes below the diaphragm. New left IJ central line in place with tip adequately positioned over the mid SVC. No pneumothorax seen. Diffuse airspace opacities are again noted throughout the right lung. Again noted is a probable small right pleural effusion, unchanged. No new lung findings. IMPRESSION: 1. New left IJ central line appears adequately positioned with tip projected over the mid SVC. No pneumothorax. 2. Otherwise stable chest x-ray. Diffuse airspace opacities throughout the right lung, most prominent at the right lung base, unchanged in the short-term interval, most suggestive of pneumonia versus asymmetric pulmonary edema. Probable small right pleural effusion. Left lung remains clear. Electronically Signed   By: Franki Cabot M.D.   On: 11/14/2015 08:21   Dg Chest Port 1 View  11/05/2015  CLINICAL DATA:  Respiratory failure. EXAM: PORTABLE CHEST 1 VIEW COMPARISON:  11/21/2015. FINDINGS: Endotracheal tube, NG tube in stable position. Heart size stable. Stable cardiomegaly. Diffuse right lung infiltrate, all slight interim improvement. Mild atelectasis and/or infiltrate left lung base. Small right pleural effusion cannot be excluded. No pneumothorax . Previously identified tiny metallic density noted along the right lateral chest well no longer identified. IMPRESSION: 1. Lines and tubes in stable position. 2. Diffuse right lung infiltrate, slight interim improvement. Mild left lower lobe atelectasis and/or infiltrate. Small right pleural effusion cannot be excluded. Electronically Signed   By: Marcello Moores  Register   On: 11/07/2015 07:16   Dg Chest Portable 1 View  11/23/2015  CLINICAL DATA:  Intubation. Respiratory distress. History of hypertension, prostate cancer.  EXAM: PORTABLE CHEST 1 VIEW COMPARISON:  Chest radiograph September 08, 2011 FINDINGS: Endotracheal tube tip projects 2.3 cm above the carina. Nasogastric tube past the proximal stomach, distal tip not imaged. Multiple pacer pads and wires overlie the chest. The cardiac silhouette appears moderately enlarged, mediastinal silhouette is nonsuspicious. Diffuse interstitial prominence, RIGHT greater than LEFT with pulmonary vascular congestion. No pleural effusions though LEFT costophrenic angle is not imaged. No pneumothorax. Small metallic linear density projecting at RIGHT chest wall may be the external to the patient or, represent needle fragment. Recommend direct inspection. IMPRESSION: Endotracheal tube tip projects 2.3 cm above the carina. Nasogastric tube past proximal stomach. Increasing cardiomegaly and findings of asymmetric interstitial pulmonary edema. Electronically Signed   By: Elon Alas M.D.   On: 12/03/2015 22:59   Dg Abd Portable 1v  11/21/2015  CLINICAL DATA:  70 year old male status post intubation and enteric tube placement. Respiratory distress. EXAM: PORTABLE ABDOMEN - 1 VIEW COMPARISON:  None. FINDINGS: An endotracheal tube is partially visualized extending into the left hemi abdomen with tip positioned in the left lower abdomen overlying the iliac  crest. Normal caliber air-filled loops of small bowel noted. There is cardiomegaly. There is diffuse hazy density of the right lung base. A 2 cm linear radiopaque density over the right lung base may be superimposed on the patient. Clinical correlation is recommended. IMPRESSION: Enteric tube the tip in the left lower abdomen superimposed over the iliac crest. Electronically Signed   By: Anner Crete M.D.   On: 11/06/2015 23:02   Ct Portable Head W/o Cm  11/07/2015  CLINICAL DATA:  Altered mental status EXAM: CT HEAD WITHOUT CONTRAST TECHNIQUE: Contiguous paraaxial images were obtained from the base of the skull through the vertex without  intravenous contrast. COMPARISON:  None. FINDINGS: There is mild diffuse atrophy. There is no intracranial mass, hemorrhage, extra-axial fluid collection, or midline shift. There are lacunar type infarcts in the centra semiovale bilaterally. There is patchy small vessel disease in the centra semiovale bilaterally. There is no demonstrable acute infarct. The bony calvarium appears intact. The mastoid air cells are clear. No intraorbital lesions are evident. 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 No acute infarct evident. No hemorrhage or mass effect. Electronically Signed   By: Lowella Grip III M.D.   On: 11/07/2015 13:40    PHYSICAL EXAM General: Intubated, sedated.  Neck:  Left TLC, no thyromegaly or thyroid nodule.  Lungs: Coarse BS bilaterally. CV: Lateral PMI.  Heart regular S1/S2, no S3/S4.  No peripheral edema.   Abdomen: Soft, no hepatosplenomegaly, no distention.  Neurologic: Not following commands, gaze to upper left.   Extremities: No clubbing or cyanosis. IABP site ok.   TELEMETRY: Reviewed telemetry pt in NSR  ASSESSMENT AND PLAN: 70 yo with history of prior prostate cancer treated with radiation, known CAD, and active smoking was found by friends on 3/2 to be in respiratory distress. To ER, intubated, NSTEMI with troponin >65. He had CPR for PEA x 2, IABP placed.  Suspect co-existing severe PNA and NSTEMI.  1. ID: PNA on CXR, PCT > 175, Strep pneumo urine antigen positive.  I think that he probably has PNA co-existing with MI and mixed septic/cardiogenic shock.   - Broad spectrum abx per CCM.  2. Cardiogenic (+/- septic) shock: Echo with EF 15-20%.  Off pressors, IABP out.  Remains on milrinone 0.125.  Co-ox 74% with CVP 4. BP good.  - Stop milrinone.   - Increase hydralazine to 37.5 mg tid and isordil to 20 tid. 3. CAD: Suspect NSTEMI with TnI now > 65 and low EF.  No cath/intervention  yet.  - Will need angiography if/when more stable.  - Continue ASA 81, statin.   4. AKI: Suspect triggered by shock.  Renal following, may end up needing CVVH but still making urine.  5. Neuro: Now s/p PEA arrest with CPR x 2.  Not following commands and gazes left.  Suspect will need neuro evaluation. Prognosis concerning. 6. Heme: Fall in plts and hemoglobin thought to be related to IABP/sepsis but plts continue to come down even off IABP.  He has been on heparin.  Will stop heparin and place ICDs.  Think we need to check HIT.    7. Elevated LFTs: Shock liver. LFTs now coming down.  Hemodynamically stable but remains with multi-system organ failure and possible anoxic brain injury. Prognosis quite tenuous.   The patient is critically ill with multiple organ systems failure and requires high complexity decision making for assessment and support, frequent evaluation and titration of  therapies, application of advanced monitoring technologies and extensive interpretation of multiple databases.   Loralie Champagne 11/10/2015 7:59 AM

## 2015-11-10 NOTE — Progress Notes (Signed)
RN to bedside. Pt coughing and gagging over ventilator. HR 130-140, SBP>200, spO2<90. RN titrated fent gtt up with a bolus. RT to bedside to assess vent. ETT and oral suctioning done with no change is status. Georgann Housekeeper NP notified. Sedation ordered. Coag labs ordered to assess status due to increased frank blood from ETT. Will continue to monitor and assess pt closely.

## 2015-11-10 NOTE — Progress Notes (Signed)
[facility] Rounding Note  Subjective:   Intubated, sedated Not following commands Pressors off right now, milrinone stopped earlier today  Objective Vital signs in last 24 hours: Filed Vitals:   11/10/15 1000 11/10/15 1100 11/10/15 1129 11/10/15 1200  BP: 108/51 139/65 139/65 93/51  Pulse: 78 77 77 73  Temp: 99.5 F (37.5 C) 99.3 F (37.4 C) 99.1 F (37.3 C) 99.1 F (37.3 C)  TempSrc:    Core (Comment)  Resp: 12 12 12 14   Height:      Weight:      SpO2: 98% 98% 99% 100%   Weight change: 0.6 kg (1 lb 5.2 oz)  Intake/Output Summary (Last 24 hours) at 11/10/15 1240 Last data filed at 11/10/15 1200  Gross per 24 hour  Intake 2085.42 ml  Output   1350 ml  Net 735.42 ml   Physical Exam:  Blood pressure 93/51, pulse 73, temperature 99.1 F (37.3 C), temperature source Core (Comment), resp. rate 14, height 5\' 9"  (1.753 m), weight 55.1 kg (121 lb 7.6 oz), SpO2 100 %. CVP 5 Intubated, sedated.  Neck: Left triple lumen line in place  Lungs: Coarse BS  CV: Lateral PMI.  Regular S1/S2, no S3/S4. Abdomen: Soft, non-distended.  Neurologic: Not following commands, gaze to upper left. No edema LE's   Labs: Basic Metabolic Panel:  Recent Labs Lab 11/27/2015 0245  11/21/2015 1437 11/30/2015 1833 11/07/15 0325 11/07/15 1649 11/08/15 0357 11/09/15 0415 11/10/15 0447  NA 143  < > 144 142 141 143 140 141 143  K 3.9  < > 3.4* 3.5 3.8 3.8 4.0 3.5 3.5  CL 109  < > 105 102 99* 97* 98* 98* 102  CO2 17*  < > 28 27 26 30 28 31  32  GLUCOSE 140*  < > 130* 109* 98 112* 148* 140* 101*  BUN 14  < > 21* 23* 28* 36* 43* 47* 45*  CREATININE 1.69*  < > 2.41* 2.61* 2.92* 3.54* 3.61* 3.71* 3.56*  CALCIUM 8.5*  < > 8.2* 8.0* 7.7* 7.6* 7.5* 8.2* 8.0*  PHOS 5.3*  --  3.0 2.7 2.8  --   --   --   --   < > = values in this interval not displayed.    Recent Labs Lab 11/08/15 0357 11/09/15 0415 11/10/15 0447  AST 307* 114* 48*  ALT 414* 268* 163*  ALKPHOS 65 59 63  BILITOT 1.2 0.6 0.6   PROT 4.6* 4.8* 4.6*  ALBUMIN 2.2* 2.1* 2.0*      Recent Labs Lab 11/16/2015 2239  11/07/15 0325 11/08/15 0357 11/09/15 0415 11/10/15 0447  WBC 20.9*  < > 13.6* 18.4* 17.6* 14.2*  NEUTROABS 16.5*  --  12.1*  --  15.9* 12.0*  HGB 12.9*  < > 9.8* 9.1* 9.1* 8.2*  HCT 37.7*  < > 28.6* 26.1* 26.6* 23.8*  MCV 84.2  < > 78.8 79.3 79.9 80.7  PLT 175  < > 86* 77* 73* 67*  < > = values in this interval not displayed.    Recent Labs Lab 11/05/2015 0245 11/05/2015 0657 11/07/2015 0958 11/05/2015 1150 11/24/2015 1833  TROPONINI 10.60* 31.57* >65.00* >65.00* >65.00*      Recent Labs Lab 11/09/15 2052 11/10/15 0017 11/10/15 0431 11/10/15 0809 11/10/15 1205  GLUCAP 152* 130* 92 120* 176*    Studies/Results: Mr Brain Wo Contrast  11/10/2015  CLINICAL DATA:  Initial evaluation for acute stroke. EXAM: MRI HEAD WITHOUT CONTRAST TECHNIQUE: Multiplanar, multiecho pulse sequences of the brain and surrounding  structures were obtained without intravenous contrast. COMPARISON:  Prior CT from 11/07/2015. FINDINGS: Study mildly degraded by motion artifact. Diffuse prominence of the CSF containing spaces compatible with generalized cerebral atrophy. Patchy and confluent T2/FLAIR hyperintensity within the periventricular and deep white matter both cerebral hemispheres most consistent with chronic small vessel ischemic disease, moderate nature. Multiple superimposed remote lacunar infarcts involve the bilateral basal ganglia and thalami. Probable small remote lacunar infarcts within the pons as well. Confluent area of restricted diffusion within the high left frontal parietal region measures 11 x 17 x 13 mm. This demonstrates central decreased ADC signal, consistent with acute/ early subacute ischemic infarct. Probable subtle associated petechial hemorrhage without hemorrhagic transformation (series 8, image 19). Few smaller additional punctate cortical infarcts present within the left frontal parietal region.  Small punctate cortical infarct more posteriorly within the left parietal lobe (Series 4, image 32). Scattered largely cortical ischemic infarcts within the bilateral occipital regions (Series 4, image 21, 20 in). There are patchy multi focal ischemic infarcts involving the bilateral cerebellar hemispheres, left worse than right. Punctate infarcts extend along the midline in the cerebellar vermis as well. Corresponding ADC signal about these infarcts appears to largely be resolving at this time, suggesting at these are early subacute in nature. Probable faint associated petechial hemorrhage within the left cerebellar hemisphere without hemorrhagic transformation (series 8, image 5). There is associated T2/FLAIR signal intensity about these infarcts with minimal localized edema without significant mass effect. Major intracranial vascular flow voids are maintained. No mass lesion, midline shift, or mass effect. Ventricular prominence related to global parenchymal volume loss present without hydrocephalus. No extra-axial fluid collection. Major dural sinuses are patent. Craniocervical junction within normal limits. Visualized upper cervical spine unremarkable. Pituitary gland normal.  No acute abnormality about the orbits. Scattered mucosal thickening within the ethmoidal air cells and sphenoid sinuses. Fluid within the nasopharynx. Patient likely is intubated. Scattered opacity within the bilateral mastoid air cells. Inner ear structures grossly normal. Bone marrow signal intensity within normal limits. No scalp soft tissue abnormality. IMPRESSION: 1. Scattered acute/early subacute ischemic infarcts involving the left frontoparietal region, the bilateral occipital lobes, and bilateral cerebellar hemispheres as above. No significant mass effect. Probable faint petechial hemorrhage with the dominant left frontoparietal and left cerebellar infarcts as above. No evidence for frank hemorrhagic transformation. 2. Advanced  cerebral atrophy with moderate chronic small vessel ischemic disease and multiple remote lacunar infarcts involving the bilateral basal ganglia, thalami, and pons. Electronically Signed   By: Jeannine Boga M.D.   On: 11/10/2015 02:57   Dg Chest Port 1 View  11/10/2015  CLINICAL DATA:  70 year old male with respiratory failure, intubated. Cerebral infarcts. Initial encounter. EXAM: PORTABLE CHEST 1 VIEW COMPARISON:  11/09/2015 and earlier. FINDINGS: Portable AP semi upright view at 0449 hours. Stable endotracheal tube tip between the level the clavicles and carina. Enteric tube courses to the left upper quadrant, tip not included. Stable left IJ central line. Pacer/resuscitation pads have been removed. Right mid and lower lung veiling opacity has regressed since 11/16/2015. There is persistent dense left lung base opacity which obscures the left hemidiaphragm and has mildly progressed since 11/08/2015. No pneumothorax. Pulmonary vascularity is within normal limits. IMPRESSION: 1.  Stable lines and tubes. 2. Left lower lobe collapse or consolidation appears progressed since 11/08/2015. 3. Improved right lung base ventilation since 11/20/2015. Electronically Signed   By: Genevie Ann M.D.   On: 11/10/2015 07:11   Dg Chest Port 1 View  11/09/2015  CLINICAL DATA:  Pneumonia, history hypertension, prostate cancer, cardiomyopathy, smoker EXAM: PORTABLE CHEST 1 VIEW COMPARISON:  Portable exam 0424 hours compared to 11/08/2015 FINDINGS: Tip of endotracheal tube projects 3.8 cm above carina. Nasogastric tube extends into abdomen. External pacing leads in EKG leads noted. LEFT jugular central venous catheter tip projects over SVC. Minimal enlargement of cardiac silhouette with slight pulmonary vascular congestion. Mediastinal contour stable. Perihilar to basilar infiltrates bilaterally favoring pulmonary edema. No definite pleural effusion or pneumothorax. IMPRESSION: Suspected pulmonary edema, little changed.  Electronically Signed   By: Lavonia Dana M.D.   On: 11/09/2015 07:05   Medications: . sodium chloride Stopped (11/09/15 0800)  . sodium chloride 10 mL/hr at 11/10/15 0203  . feeding supplement (VITAL AF 1.2 CAL) 1,000 mL (11/10/15 0020)  . fentaNYL infusion INTRAVENOUS 100 mcg/hr (11/10/15 0800)   . antiseptic oral rinse  7 mL Mouth Rinse 10 times per day  . aspirin  81 mg Per Tube Daily  . atorvastatin  40 mg Per Tube q1800  . cefTRIAXone (ROCEPHIN)  IV  2 g Intravenous Q12H  . chlorhexidine gluconate  15 mL Mouth Rinse BID  . hydrALAZINE  37.5 mg Per Tube 3 times per day  . insulin aspart  0-15 Units Subcutaneous 6 times per day  . isosorbide dinitrate  20 mg Per Tube TID  . pantoprazole (PROTONIX) IV  40 mg Intravenous QHS  . sodium chloride flush  10-40 mL Intracatheter Q12H  . sodium chloride flush  3 mL Intravenous Q12H   Background 70 yr male with hx Prostate Ca, s/p Radrx, hx depression, pancreatitis, HTN (on ARB PTA). Baseline CKD 3 (1.2-1.6)  Presented with resp distress, had PEA arrest in the ED, pressor dependent hypotension - septic (PNA with + S pneumo)  and cardiogenic shock (EF 15-20%), NSTEMI. Required pressors, inotropes, IABP. Developed AKI on CKD likely ischemic ATN.   Assessment/Recommendations  1. AKI on CKD3 - likely ischemic ATN d/t shock. Creatinine peaked at around 3.7 on 3/5 and today is just a bit better at 3.56 and is making some urine. K is fine, acid base OK, No hard indications for dialysis. Pulm wants volume even with prn lasix. Given question of anoxic brain injury/multiple strokes on MRI - would be a VERY poor candidate for dialysis in the short or long run... 2. VDRF - PNA/CAP. S Pneumo +  Poor mental status limits weaning 3. Septic shock d/t PNA 4. Cardiogenic shock - EF 15-20% with RV dysfunction as well. IABP out. Milrinone stopped today.  5. Shock liver - some improvement in LFT's 6. Acute metabolic encephalopathy - felt anoxic brain injury.  Multiple strokes on MRI. Prognosis would appear poor...  Jamal Maes, MD [facility] [phone] pager 11/10/2015, 12:40 PM

## 2015-11-11 ENCOUNTER — Encounter (HOSPITAL_COMMUNITY): Payer: Medicare Other

## 2015-11-11 ENCOUNTER — Inpatient Hospital Stay (HOSPITAL_COMMUNITY): Payer: Medicare Other

## 2015-11-11 LAB — GLUCOSE, CAPILLARY
GLUCOSE-CAPILLARY: 85 mg/dL (ref 65–99)
GLUCOSE-CAPILLARY: 194 mg/dL — AB (ref 65–99)
Glucose-Capillary: 137 mg/dL — ABNORMAL HIGH (ref 65–99)
Glucose-Capillary: 156 mg/dL — ABNORMAL HIGH (ref 65–99)
Glucose-Capillary: 81 mg/dL (ref 65–99)
Glucose-Capillary: 192 mg/dL — ABNORMAL HIGH (ref 65–99)

## 2015-11-11 LAB — CBC
HCT: 22.2 % — ABNORMAL LOW (ref 39.0–52.0)
HEMOGLOBIN: 7.5 g/dL — AB (ref 13.0–17.0)
MCH: 27.1 pg (ref 26.0–34.0)
MCHC: 33.8 g/dL (ref 30.0–36.0)
MCV: 80.1 fL (ref 78.0–100.0)
PLATELETS: 70 10*3/uL — AB (ref 150–400)
RBC: 2.77 MIL/uL — AB (ref 4.22–5.81)
RDW: 13.8 % (ref 11.5–15.5)
WBC: 9.1 10*3/uL (ref 4.0–10.5)

## 2015-11-11 LAB — CARBOXYHEMOGLOBIN
CARBOXYHEMOGLOBIN: 1.3 % (ref 0.5–1.5)
METHEMOGLOBIN: 0.7 % (ref 0.0–1.5)
O2 SAT: 70.8 %
TOTAL HEMOGLOBIN: 7.9 g/dL — AB (ref 13.5–18.0)

## 2015-11-11 LAB — RENAL FUNCTION PANEL
Albumin: 1.9 g/dL — ABNORMAL LOW (ref 3.5–5.0)
Anion gap: 12 (ref 5–15)
BUN: 45 mg/dL — AB (ref 6–20)
CHLORIDE: 104 mmol/L (ref 101–111)
CO2: 31 mmol/L (ref 22–32)
CREATININE: 3.3 mg/dL — AB (ref 0.61–1.24)
Calcium: 8.2 mg/dL — ABNORMAL LOW (ref 8.9–10.3)
GFR calc Af Amer: 21 mL/min — ABNORMAL LOW (ref >60)
GFR, EST NON AFRICAN AMERICAN: 18 mL/min — AB (ref >60)
GLUCOSE: 88 mg/dL (ref 65–99)
POTASSIUM: 3.4 mmol/L — AB (ref 3.5–5.1)
Phosphorus: 3.7 mg/dL (ref 2.5–4.6)
Sodium: 147 mmol/L — ABNORMAL HIGH (ref 135–145)

## 2015-11-11 LAB — MAGNESIUM: MAGNESIUM: 1.9 mg/dL (ref 1.7–2.4)

## 2015-11-11 LAB — HEPARIN INDUCED PLATELET AB (HIT ANTIBODY): Heparin Induced Plt Ab: 0.236 OD (ref 0.000–0.400)

## 2015-11-11 LAB — CREATININE, URINE, RANDOM: CREATININE, URINE: 17.63 mg/dL

## 2015-11-11 LAB — SODIUM, URINE, RANDOM: SODIUM UR: 131 mmol/L

## 2015-11-11 MED ORDER — PNEUMOCOCCAL VAC POLYVALENT 25 MCG/0.5ML IJ INJ: 0.5000 mL | INJECTION | INTRAMUSCULAR | Status: DC | PRN

## 2015-11-11 MED ORDER — FUROSEMIDE 10 MG/ML IJ SOLN
40.0000 mg | Freq: Once | INTRAMUSCULAR | Status: AC
  Administered 2015-11-11: 40 mg via INTRAVENOUS
  Filled 2015-11-11: qty 4

## 2015-11-11 MED ORDER — PANTOPRAZOLE SODIUM 40 MG PO PACK
40.0000 mg | PACK | Freq: Every day | ORAL | Status: DC
  Administered 2015-11-11 – 2015-11-18 (×8): 40 mg
  Filled 2015-11-11 (×8): qty 20

## 2015-11-11 MED ORDER — INFLUENZA VAC SPLIT QUAD 0.5 ML IM SUSY: 0.5000 mL | PREFILLED_SYRINGE | INTRAMUSCULAR | Status: DC | PRN

## 2015-11-11 MED ORDER — FREE WATER
200.0000 mL | Freq: Three times a day (TID) | Status: DC
  Administered 2015-11-11 – 2015-11-16 (×17): 200 mL

## 2015-11-11 MED ORDER — POTASSIUM CHLORIDE 20 MEQ/15ML (10%) PO SOLN
40.0000 meq | Freq: Once | ORAL | Status: AC
  Administered 2015-11-11: 40 meq
  Filled 2015-11-11: qty 30

## 2015-11-11 NOTE — Progress Notes (Signed)
VASCULAR LAB   Will defer Carotid duplex until IJ line is removed.  Please advise if study is urgently needed.   Adna Nofziger, RVT 11/11/2015, 2:32 PM

## 2015-11-11 NOTE — Progress Notes (Signed)
[facility] Rounding Note  Subjective:   Intubated, sedated When sedation was lightened became agitated and hypertensive but did not follow commands Creatinine trending down a little UOP 1 liter past 24 hours  Objective Vital signs in last 24 hours: Filed Vitals:   11/11/15 0800 11/11/15 0900 11/11/15 1000 11/11/15 1108  BP: 145/60 169/78 103/52 101/53  Pulse: 64 79 58 60  Temp:      TempSrc:      Resp: 12 9 12 12   Height:      Weight:      SpO2: 97% 91% 96% 96%   Weight change: 0.8 kg (1 lb 12.2 oz)  Intake/Output Summary (Last 24 hours) at 11/11/15 1117 Last data filed at 11/11/15 1000  Gross per 24 hour  Intake 1616.9 ml  Output   1045 ml  Net  571.9 ml   Physical Exam:  BP 101/53 mmHg  Pulse 60  Temp(Src) 98.2 F (36.8 C) (Oral)  Resp 12  Ht 5\' 9"  (1.753 m)  Wt 55.9 kg (123 lb 3.8 oz)  BMI 18.19 kg/m2  SpO2 96%  CVP 7 Intubated, sedated.  ETT, OGT Neck: Left triple lumen line in place   Regular S1/S2, no S3/S4. Abdomen: Soft, non-distended.  Neurologic: Not following commands No edema LE's SCD's in place  Labs: Basic Metabolic Panel:  Recent Labs Lab 11/29/2015 0245  11/13/2015 1437 11/18/2015 1833 11/07/15 0325 11/07/15 1649 11/08/15 0357 11/09/15 0415 11/10/15 0447 11/11/15 0445  NA 143  < > 144 142 141 143 140 141 143 147*  K 3.9  < > 3.4* 3.5 3.8 3.8 4.0 3.5 3.5 3.4*  CL 109  < > 105 102 99* 97* 98* 98* 102 104  CO2 17*  < > 28 27 26 30 28 31  32 31  GLUCOSE 140*  < > 130* 109* 98 112* 148* 140* 101* 88  BUN 14  < > 21* 23* 28* 36* 43* 47* 45* 45*  CREATININE 1.69*  < > 2.41* 2.61* 2.92* 3.54* 3.61* 3.71* 3.56* 3.30*  CALCIUM 8.5*  < > 8.2* 8.0* 7.7* 7.6* 7.5* 8.2* 8.0* 8.2*  PHOS 5.3*  --  3.0 2.7 2.8  --   --   --   --  3.7  < > = values in this interval not displayed.    Recent Labs Lab 11/08/15 0357 11/09/15 0415 11/10/15 0447 11/11/15 0445  AST 307* 114* 48*  --   ALT 414* 268* 163*  --   ALKPHOS 65 59 63  --   BILITOT 1.2 0.6  0.6  --   PROT 4.6* 4.8* 4.6*  --   ALBUMIN 2.2* 2.1* 2.0* 1.9*      Recent Labs Lab 11/18/2015 2239  11/07/15 0325  11/09/15 0415 11/10/15 0447 11/10/15 1645 11/11/15 0445  WBC 20.9*  < > 13.6*  < > 17.6* 14.2* 11.9* 9.1  NEUTROABS 16.5*  --  12.1*  --  15.9* 12.0*  --   --   HGB 12.9*  < > 9.8*  < > 9.1* 8.2* 8.5* 7.5*  HCT 37.7*  < > 28.6*  < > 26.6* 23.8* 25.3* 22.2*  MCV 84.2  < > 78.8  < > 79.9 80.7 80.6 80.1  PLT 175  < > 86*  < > 73* 67* 70* 70*  < > = values in this interval not displayed.    Recent Labs Lab 12/01/2015 0245 11/18/2015 0657 11/05/2015 0958 11/18/2015 1150 11/21/2015 1833  TROPONINI 10.60* 31.57* >65.00* >65.00* >65.00*  Recent Labs Lab 11/10/15 1628 11/10/15 2041 11/10/15 2344 11/11/15 0444 11/11/15 0822  GLUCAP 104* 137* 156* 81 85    Studies/Results: Mr Brain Wo Contrast  11/10/2015  CLINICAL DATA:  Initial evaluation for acute stroke. EXAM: MRI HEAD WITHOUT CONTRAST TECHNIQUE: Multiplanar, multiecho pulse sequences of the brain and surrounding structures were obtained without intravenous contrast. COMPARISON:  Prior CT from 11/07/2015. FINDINGS: Study mildly degraded by motion artifact. Diffuse prominence of the CSF containing spaces compatible with generalized cerebral atrophy. Patchy and confluent T2/FLAIR hyperintensity within the periventricular and deep white matter both cerebral hemispheres most consistent with chronic small vessel ischemic disease, moderate nature. Multiple superimposed remote lacunar infarcts involve the bilateral basal ganglia and thalami. Probable small remote lacunar infarcts within the pons as well. Confluent area of restricted diffusion within the high left frontal parietal region measures 11 x 17 x 13 mm. This demonstrates central decreased ADC signal, consistent with acute/ early subacute ischemic infarct. Probable subtle associated petechial hemorrhage without hemorrhagic transformation (series 8, image 19). Few  smaller additional punctate cortical infarcts present within the left frontal parietal region. Small punctate cortical infarct more posteriorly within the left parietal lobe (Series 4, image 32). Scattered largely cortical ischemic infarcts within the bilateral occipital regions (Series 4, image 21, 20 in). There are patchy multi focal ischemic infarcts involving the bilateral cerebellar hemispheres, left worse than right. Punctate infarcts extend along the midline in the cerebellar vermis as well. Corresponding ADC signal about these infarcts appears to largely be resolving at this time, suggesting at these are early subacute in nature. Probable faint associated petechial hemorrhage within the left cerebellar hemisphere without hemorrhagic transformation (series 8, image 5). There is associated T2/FLAIR signal intensity about these infarcts with minimal localized edema without significant mass effect. Major intracranial vascular flow voids are maintained. No mass lesion, midline shift, or mass effect. Ventricular prominence related to global parenchymal volume loss present without hydrocephalus. No extra-axial fluid collection. Major dural sinuses are patent. Craniocervical junction within normal limits. Visualized upper cervical spine unremarkable. Pituitary gland normal.  No acute abnormality about the orbits. Scattered mucosal thickening within the ethmoidal air cells and sphenoid sinuses. Fluid within the nasopharynx. Patient likely is intubated. Scattered opacity within the bilateral mastoid air cells. Inner ear structures grossly normal. Bone marrow signal intensity within normal limits. No scalp soft tissue abnormality. IMPRESSION: 1. Scattered acute/early subacute ischemic infarcts involving the left frontoparietal region, the bilateral occipital lobes, and bilateral cerebellar hemispheres as above. No significant mass effect. Probable faint petechial hemorrhage with the dominant left frontoparietal and  left cerebellar infarcts as above. No evidence for frank hemorrhagic transformation. 2. Advanced cerebral atrophy with moderate chronic small vessel ischemic disease and multiple remote lacunar infarcts involving the bilateral basal ganglia, thalami, and pons. Electronically Signed   By: Jeannine Boga M.D.   On: 11/10/2015 02:57   Dg Chest Port 1 View  11/11/2015  CLINICAL DATA:  Acute respiratory failure.  Shortness breath. EXAM: PORTABLE CHEST 1 VIEW COMPARISON:  11/10/2015. FINDINGS: Endotracheal tube, NG tube, left IJ line in stable position. Cardiomegaly with progressive diffuse bilateral pulmonary infiltrates. Progression of small pleural effusions. No pneumothorax. IMPRESSION: 1. Lines and tubes in stable position. 2. Cardiomegaly with diffuse progression of bilateral pulmonary infiltrates consistent with diffuse bilateral pulmonary edema. Bilateral small pleural effusions are noted. Electronically Signed   By: Marcello Moores  Register   On: 11/11/2015 07:07   Dg Chest Port 1 View  11/10/2015  CLINICAL DATA:  70 year old male with  respiratory failure, intubated. Cerebral infarcts. Initial encounter. EXAM: PORTABLE CHEST 1 VIEW COMPARISON:  11/09/2015 and earlier. FINDINGS: Portable AP semi upright view at 0449 hours. Stable endotracheal tube tip between the level the clavicles and carina. Enteric tube courses to the left upper quadrant, tip not included. Stable left IJ central line. Pacer/resuscitation pads have been removed. Right mid and lower lung veiling opacity has regressed since 11/15/2015. There is persistent dense left lung base opacity which obscures the left hemidiaphragm and has mildly progressed since 11/08/2015. No pneumothorax. Pulmonary vascularity is within normal limits. IMPRESSION: 1.  Stable lines and tubes. 2. Left lower lobe collapse or consolidation appears progressed since 11/08/2015. 3. Improved right lung base ventilation since 11/18/2015. Electronically Signed   By: Genevie Ann M.D.    On: 11/10/2015 07:11   Medications: . sodium chloride Stopped (11/09/15 0800)  . sodium chloride 10 mL/hr at 11/10/15 0203  . dexmedetomidine Stopped (11/11/15 0800)  . feeding supplement (VITAL AF 1.2 CAL) 1,000 mL (11/11/15 0055)  . fentaNYL infusion INTRAVENOUS Stopped (11/11/15 0800)   . antiseptic oral rinse  7 mL Mouth Rinse 10 times per day  . aspirin  81 mg Per Tube Daily  . atorvastatin  40 mg Per Tube q1800  . cefTRIAXone (ROCEPHIN)  IV  2 g Intravenous Q12H  . chlorhexidine gluconate  15 mL Mouth Rinse BID  . free water  200 mL Per Tube 3 times per day  . furosemide  40 mg Intravenous Once  . hydrALAZINE  37.5 mg Per Tube 3 times per day  . insulin aspart  0-15 Units Subcutaneous 6 times per day  . isosorbide dinitrate  20 mg Per Tube TID  . pantoprazole sodium  40 mg Per Tube QHS  . sodium chloride flush  10-40 mL Intracatheter Q12H  . sodium chloride flush  3 mL Intravenous Q12H   Background 70 yr male with hx Prostate Ca, s/p Radrx, hx depression, pancreatitis, HTN (on ARB PTA). Baseline CKD 3 (1.2-1.6)  Presented with resp distress, had PEA arrest in the ED, pressor dependent hypotension - septic (PNA with + S pneumo)  and cardiogenic shock (EF 15-20%), NSTEMI. Required pressors, inotropes, IABP. Developed AKI on CKD likely ischemic ATN.   Assessment/Recommendations  1. AKI on CKD3 - likely ischemic ATN d/t shock. Creatinine peaked at around 3.7 on 3/5 and is slowly trending down. UOP is down some - CCM wants to give lasix and cards does not feel needs. Will let them discuss that.  Needs some ginger K replacement for K of 3.4 - appears had 40 via tube just now.  No dialysis or CRRT indications.  Given question of anoxic brain injury/multiple strokes on MRI - would be a VERY poor candidate for dialysis in the short or long run... 2. VDRF - PNA/CAP. S Pneumo +  Poor mental status limits weaning 3. Septic shock d/t PNA 4. Cardiogenic shock - EF 15-20% with RV dysfunction  as well. IABP out. Milrinone stopped 3/6. 5. Shock liver - some improvement in LFT's 6. Acute metabolic encephalopathy - felt anoxic brain injury. Multiple strokes on MRI. Prognosis would appear poor... 7. Bilateral pulmonary infiltrates. ? Edema? (but CVP only 5-7). Bleeding from ETT - ? pulm hemorrhage. 8. Anemia - hb steadily trending down. Per primary service   Jamal Maes, MD [facility] [phone] pager 11/11/2015, 11:17 AM

## 2015-11-11 NOTE — Progress Notes (Addendum)
PULMONARY / CRITICAL CARE MEDICINE   Name: William Mccullough MRN: [id number] DOB: 08-22-1946    ADMISSION DATE:  11/20/2015 CONSULTATION DATE:  11/07/2015  REFERRING MD:  EDP  CHIEF COMPLAINT:  SOB  HISTORY OF PRESENT ILLNESS:  Pt is encephelopathic; therefore, this HPI is obtained from chart review. William Mccullough is a 70 y.o. male with PMH as outlined below. Found down, MODS, cardiogenic shock.  SUBJECTIVE:  Off pressors Agitated > started on precedex.  VITAL SIGNS: BP 145/60 mmHg  Pulse 64  Temp(Src) 98.2 F (36.8 C) (Oral)  Resp 12  Ht 5\' 9"  (1.753 m)  Wt 123 lb 3.8 oz (55.9 kg)  BMI 18.19 kg/m2  SpO2 97%  HEMODYNAMICS: CVP:  [1 mmHg-8 mmHg] 6 mmHg  VENTILATOR SETTINGS: Vent Mode:  [-] PRVC FiO2 (%):  [40 %-100 %] 40 % Set Rate:  [12 bmp] 12 bmp Vt Set:  [500 mL] 500 mL PEEP:  [5 cmH20] 5 cmH20 Plateau Pressure:  [15 cmH20-31 cmH20] 15 cmH20  INTAKE / OUTPUT: I/O last 3 completed shifts: In: 3141.4 [I.V.:1011.4; NG/GT:1980; IV Piggyback:150] Out: 1895 [id number]   PHYSICAL EXAMINATION: General: AA male, No purposeful response to commands Neuro: Moves all four extremities.  HEENT: No thyromegaly or JVD,  Cardiovascular: RRR, no MRG Lungs: Clear, no MRG Abdomen: Soft, + BS, NT/ND Musculoskeletal: No gross deformities, no edema.  Skin: No rashes.  LABS:  BMET  Recent Labs Lab 11/09/15 0415 11/10/15 0447 11/11/15 0445  NA 141 143 147*  K 3.5 3.5 3.4*  CL 98* 102 104  CO2 31 32 31  BUN 47* 45* 45*  CREATININE 3.71* 3.56* 3.30*  GLUCOSE 140* 101* 88    Electrolytes  Recent Labs Lab 11/18/2015 0245  11/18/2015 1833 11/07/15 0325 11/07/15 1649  11/09/15 0415 11/10/15 0447 11/11/15 0445  CALCIUM 8.5*  < > 8.0* 7.7* 7.6*  < > 8.2* 8.0* 8.2*  MG 2.0  --   --   --  1.6*  --   --   --  1.9  PHOS 5.3*  < > 2.7 2.8  --   --   --   --  3.7  < > = values in this interval not displayed.  CBC  Recent Labs Lab 11/10/15 0447 11/10/15 1645  11/11/15 0445  WBC 14.2* 11.9* 9.1  HGB 8.2* 8.5* 7.5*  HCT 23.8* 25.3* 22.2*  PLT 67* 70* 70*    Coag's  Recent Labs Lab 11/07/15 1225 11/08/15 0800 11/10/15 1645  APTT >200*  --  34  INR 2.49* 1.39 1.15    Sepsis Markers  Recent Labs Lab 11/13/2015 0120 11/30/2015 0245 11/07/15 0325 11/07/15 1225 11/07/15 1600 11/08/15 0357  LATICACIDVEN 6.5* 7.5*  --  2.7* 2.6*  --   PROCALCITON 1.66  --  >175.00  --   --  149.51    ABG  Recent Labs Lab 11/08/15 0835 11/08/15 0845 11/08/15 2209  PHART 7.380 7.630* 7.424  PCO2ART 56.1* 25.4* 48.8*  PO2ART 43.0* 32.0* 89.3    Liver Enzymes  Recent Labs Lab 11/08/15 0357 11/09/15 0415 11/10/15 0447 11/11/15 0445  AST 307* 114* 48*  --   ALT 414* 268* 163*  --   ALKPHOS 65 59 63  --   BILITOT 1.2 0.6 0.6  --   ALBUMIN 2.2* 2.1* 2.0* 1.9*    Cardiac Enzymes  Recent Labs Lab 11/08/2015 0958 11/12/2015 1150 11/24/2015 1833  TROPONINI >65.00* >65.00* >65.00*    Glucose  Recent Labs Lab 11/10/15  1205 11/10/15 1628 11/10/15 2041 11/10/15 2344 11/11/15 0444 11/11/15 0822  GLUCAP 176* 104* 137* 156* 81 85    Imaging Dg Chest Port 1 View  11/11/2015  CLINICAL DATA:  Acute respiratory failure.  Shortness breath. EXAM: PORTABLE CHEST 1 VIEW COMPARISON:  11/10/2015. FINDINGS: Endotracheal tube, NG tube, left IJ line in stable position. Cardiomegaly with progressive diffuse bilateral pulmonary infiltrates. Progression of small pleural effusions. No pneumothorax. IMPRESSION: 1. Lines and tubes in stable position. 2. Cardiomegaly with diffuse progression of bilateral pulmonary infiltrates consistent with diffuse bilateral pulmonary edema. Bilateral small pleural effusions are noted. Electronically Signed   By: Marcello Moores  Register   On: 11/11/2015 07:07   STUDIES:  CXR 03/01 > cardiomegaly with asymmetric interstitial edema right side. Echo pa 33, EF global 15% Cath neg eeg 3/3 >>>diffuse cerebral dysfunction that is  non-specific in etiology and can be seen with hypoxic/ischemic injury, toxic/metabolic encephalopathies, or medication effect from Fentanyl Ct head 3/3>>> Atrophy with prior small basal ganglia infarcts bilaterally as well as patchy periventricular small vessel disease. No acute infarct MRI head 3/5 >> Multiple B/L acute/subacute infarcts, petechial hemorrhage.  CULTURES: Blood 03/01 >>> Urine 03/01 >>> Sputum 03/01 >>> Leg>>>neg Strep>>>POS  ANTIBIOTICS: Vanc 03/02 >>>3/5 Zosyn 03/02 >>>3/3 cefriaxone 3/3>>>  SIGNIFICANT EVENTS: 03/02 > admitted with acute hypoxic respiratory failure requiring intubation.  Had brief code post intubation. 3/2- balloon pump, renal failure 3/4 - off pressors, balloon pump out  LINES/TUBES: ETT 03/02 > Left IJ 3/2>>> Aline 3/2>>>3/4  ASSESSMENT / PLAN:  PULMONARY A: Acute hypoxic respiratory failure - required intubation in ED. PNA-CAP Blood from ETT P:   On min support Daily wake up and breath assessments Poor mental status is a barrier to weaning. Monitor blood from ETT. Minimize suctioning. May need a bronch  CARDIOVASCULAR A:  Septic shock, septic cardiomyoapthy Brief cardiac arrest post intubation - ROSC after 1 - 2 min Troponin leak - suspect due to demand ischemia. Hx HTN, cardiomyopathy (last echo from 2013 with EF 40-45%, grade 1DD.  Of note, per EDP, bedside cardiac POCUS revealed very poor EF estimated at 10-15%). P:  Balloon pump, out Off milrinone. Lasix as needed to keep I/O even. Give one dose 40 mg today. Consider starting aggressive diuresis. Will defer to cardiology.  RENAL A:   AKI, ATN AGMA - lactate. POS balance, chf P:   No acute need for dialysis Monitor urine output and Cr.  Start free water for increased Na K repleted  GASTROINTESTINAL A:   GI prophylaxis. Nutrition. Shock liver >> Improving LFTs P:   SUP: Pantoprazole. TF to goal  HEMATOLOGIC / ONCOLOGIC A:   VTE Prophylaxis. Hx  prostate CA - last seen in 2013 and was doing well at that time. Thrombocytopenia *(sepsis) R/o DIC component- neg Coagulapathy improved P:  SCD's Off S.Q heparin given the falling platelets. Follow HIT panel  INFECTIOUS A:   Septic shock, PAN, strep pos ag P:   Maintain cefriaxone, consider 8 days  ENDOCRINE A:   Hyperglycemia - no hx DM. P:   SSI Cortisol 15. Will use stress dose steroids if hypotensive.   NEUROLOGIC A:   Acute metabolic encephalopathy. ? Anoxic injury Multiple embolic strokes.  Hx anxiety, depression. At risk meningitis? Poor baseline status P:   Sedation:   Wean fentanyl gtt Off versed RASS goal: 0  Daily WUA Unable to LP now as low plateelts and is low yield. 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  MRI results noted. Check  carotid US  Family updated: Niece updated 3/6. Discussed that we are concerned that his poor mental status as a result of anoxic injury and multiple infarct will make liberation from vent very difficult. I dont think he is a good candidate for a trach given the underlying poor functional status. Further I reccommended that he not be resuscitated if he suffers another cardiac arrest. She want to discuss with the rest of the family and get back to [place] regarding goals of care.   Interdisciplinary Family Meeting v Palliative Care Meeting:  Due by: 03/08.  CC time: 35 minutes  Marshell Garfinkel MD [facility] Pager [phone] If no answer or after 3pm call: [phone] 11/11/2015, 10:26 AM

## 2015-11-11 NOTE — Progress Notes (Signed)
Pt E t tube was out to 20 cm moved back to original order at 23.

## 2015-11-11 NOTE — Progress Notes (Addendum)
Patient ID: William Mccullough, male   DOB: 10-Sep-1945, 70 y.o.   MRN: [id number]   SUBJECTIVE:  3/2: Respiratory arrest with intubation followed by PEA arrest x 2.  IABP placed.  Pressors begun.  CXR with RLL PNA. PCT > 175. Strep pneumo urine antigen+, flu -.  Troponin > 65   3/3: IABP out.   Echo: EF 15-20% with decreased RV function.   Remains on vent. Sedated. Awakens but doesn't follow commands.Over night HTN and with 75cc blood from ETT. Fentanyl increased and he was given 20 mg IV hydralazine. Off milrinone . Todays CO-OX is 71%.     Creatinine 3.56> 3.3  Scheduled Meds: . antiseptic oral rinse  7 mL Mouth Rinse 10 times per day  . aspirin  81 mg Per Tube Daily  . atorvastatin  40 mg Per Tube q1800  . cefTRIAXone (ROCEPHIN)  IV  2 g Intravenous Q12H  . chlorhexidine gluconate  15 mL Mouth Rinse BID  . hydrALAZINE  37.5 mg Per Tube 3 times per day  . insulin aspart  0-15 Units Subcutaneous 6 times per day  . isosorbide dinitrate  20 mg Per Tube TID  . pantoprazole (PROTONIX) IV  40 mg Intravenous QHS  . sodium chloride flush  10-40 mL Intracatheter Q12H  . sodium chloride flush  3 mL Intravenous Q12H   Continuous Infusions: . sodium chloride Stopped (11/09/15 0800)  . sodium chloride 10 mL/hr at 11/10/15 0203  . dexmedetomidine 0.4 mcg/kg/hr (11/11/15 0135)  . feeding supplement (VITAL AF 1.2 CAL) 1,000 mL (11/11/15 0055)  . fentaNYL infusion INTRAVENOUS 75 mcg/hr (11/11/15 0400)   PRN Meds:.sodium chloride, Place/Maintain arterial line **AND** sodium chloride, albuterol, fentaNYL, hydrALAZINE, sodium chloride flush   Filed Vitals:   11/11/15 0400 11/11/15 0458 11/11/15 0500 11/11/15 0600  BP: 168/81  103/58 99/49  Pulse: 79  67 55  Temp: 98.2 F (36.8 C)  98.6 F (37 C) 98.2 F (36.8 C)  TempSrc:      Resp: 16  12 12   Height:      Weight:  123 lb 3.8 oz (55.9 kg)    SpO2: 100%  100% 100%    Intake/Output Summary (Last 24 hours) at 11/11/15 0707 Last data filed  at 11/11/15 0505  Gross per 24 hour  Intake 1396.97 ml  Output   1045 ml  Net 351.97 ml    LABS: Basic Metabolic Panel:  Recent Labs  11/10/15 0447 11/11/15 0445  NA 143 147*  K 3.5 3.4*  CL 102 104  CO2 32 31  GLUCOSE 101* 88  BUN 45* 45*  CREATININE 3.56* 3.30*  CALCIUM 8.0* 8.2*  MG  --  1.9  PHOS  --  3.7   Liver Function Tests:  Recent Labs  11/09/15 0415 11/10/15 0447 11/11/15 0445  AST 114* 48*  --   ALT 268* 163*  --   ALKPHOS 59 63  --   BILITOT 0.6 0.6  --   PROT 4.8* 4.6*  --   ALBUMIN 2.1* 2.0* 1.9*   No results for input(s): LIPASE, AMYLASE in the last 72 hours. CBC:  Recent Labs  11/09/15 0415 11/10/15 0447 11/10/15 1645 11/11/15 0445  WBC 17.6* 14.2* 11.9* 9.1  NEUTROABS 15.9* 12.0*  --   --   HGB 9.1* 8.2* 8.5* 7.5*  HCT 26.6* 23.8* 25.3* 22.2*  MCV 79.9 80.7 80.6 80.1  PLT 73* 67* 70* 70*   Cardiac Enzymes: No results for input(s): CKTOTAL, CKMB, CKMBINDEX, TROPONINI  in the last 72 hours. BNP: Invalid input(s): POCBNP D-Dimer: No results for input(s): DDIMER in the last 72 hours. Hemoglobin A1C: No results for input(s): HGBA1C in the last 72 hours. Fasting Lipid Panel: No results for input(s): CHOL, HDL, LDLCALC, TRIG, CHOLHDL, LDLDIRECT in the last 72 hours. Thyroid Function Tests: No results for input(s): TSH, T4TOTAL, T3FREE, THYROIDAB in the last 72 hours.  Invalid input(s): FREET3 Anemia Panel: No results for input(s): VITAMINB12, FOLATE, FERRITIN, TIBC, IRON, RETICCTPCT in the last 72 hours.  RADIOLOGY: Mr Herby Abraham Contrast  11/10/2015  CLINICAL DATA:  Initial evaluation for acute stroke. EXAM: MRI HEAD WITHOUT CONTRAST TECHNIQUE: Multiplanar, multiecho pulse sequences of the brain and surrounding structures were obtained without intravenous contrast. COMPARISON:  Prior CT from 11/07/2015. FINDINGS: Study mildly degraded by motion artifact. Diffuse prominence of the CSF containing spaces compatible with generalized  cerebral atrophy. Patchy and confluent T2/FLAIR hyperintensity within the periventricular and deep white matter both cerebral hemispheres most consistent with chronic small vessel ischemic disease, moderate nature. Multiple superimposed remote lacunar infarcts involve the bilateral basal ganglia and thalami. Probable small remote lacunar infarcts within the pons as well. Confluent area of restricted diffusion within the high left frontal parietal region measures 11 x 17 x 13 mm. This demonstrates central decreased ADC signal, consistent with acute/ early subacute ischemic infarct. Probable subtle associated petechial hemorrhage without hemorrhagic transformation (series 8, image 19). Few smaller additional punctate cortical infarcts present within the left frontal parietal region. Small punctate cortical infarct more posteriorly within the left parietal lobe (Series 4, image 32). Scattered largely cortical ischemic infarcts within the bilateral occipital regions (Series 4, image 21, 20 in). There are patchy multi focal ischemic infarcts involving the bilateral cerebellar hemispheres, left worse than right. Punctate infarcts extend along the midline in the cerebellar vermis as well. Corresponding ADC signal about these infarcts appears to largely be resolving at this time, suggesting at these are early subacute in nature. Probable faint associated petechial hemorrhage within the left cerebellar hemisphere without hemorrhagic transformation (series 8, image 5). There is associated T2/FLAIR signal intensity about these infarcts with minimal localized edema without significant mass effect. Major intracranial vascular flow voids are maintained. No mass lesion, midline shift, or mass effect. Ventricular prominence related to global parenchymal volume loss present without hydrocephalus. No extra-axial fluid collection. Major dural sinuses are patent. Craniocervical junction within normal limits. Visualized upper cervical  spine unremarkable. Pituitary gland normal.  No acute abnormality about the orbits. Scattered mucosal thickening within the ethmoidal air cells and sphenoid sinuses. Fluid within the nasopharynx. Patient likely is intubated. Scattered opacity within the bilateral mastoid air cells. Inner ear structures grossly normal. Bone marrow signal intensity within normal limits. No scalp soft tissue abnormality. IMPRESSION: 1. Scattered acute/early subacute ischemic infarcts involving the left frontoparietal region, the bilateral occipital lobes, and bilateral cerebellar hemispheres as above. No significant mass effect. Probable faint petechial hemorrhage with the dominant left frontoparietal and left cerebellar infarcts as above. No evidence for frank hemorrhagic transformation. 2. Advanced cerebral atrophy with moderate chronic small vessel ischemic disease and multiple remote lacunar infarcts involving the bilateral basal ganglia, thalami, and pons. Electronically Signed   By: Jeannine Boga M.D.   On: 11/10/2015 02:57   US Renal  11/08/2015  CLINICAL DATA:  Acute renal injury EXAM: RENAL / URINARY TRACT ULTRASOUND COMPLETE COMPARISON:  None. FINDINGS: Right Kidney: Length: 10.8 cm. Echogenicity within normal limits. No mass or hydronephrosis visualized. Left Kidney: Length: 10.3 cm. No hydronephrosis  is noted. Calcifications are identified within the renal pelvis consistent with renal calculi. The largest of these measures 9 mm. Bladder: Decompressed by Foley catheter. IMPRESSION: Left renal calculi without acute abnormality. Electronically Signed   By: Inez Catalina M.D.   On: 11/21/2015 18:54   Dg Chest Port 1 View  11/10/2015  CLINICAL DATA:  70 year old male with respiratory failure, intubated. Cerebral infarcts. Initial encounter. EXAM: PORTABLE CHEST 1 VIEW COMPARISON:  11/09/2015 and earlier. FINDINGS: Portable AP semi upright view at 0449 hours. Stable endotracheal tube tip between the level the clavicles  and carina. Enteric tube courses to the left upper quadrant, tip not included. Stable left IJ central line. Pacer/resuscitation pads have been removed. Right mid and lower lung veiling opacity has regressed since 11/20/2015. There is persistent dense left lung base opacity which obscures the left hemidiaphragm and has mildly progressed since 11/08/2015. No pneumothorax. Pulmonary vascularity is within normal limits. IMPRESSION: 1.  Stable lines and tubes. 2. Left lower lobe collapse or consolidation appears progressed since 11/08/2015. 3. Improved right lung base ventilation since 11/09/2015. Electronically Signed   By: Genevie Ann M.D.   On: 11/10/2015 07:11   Dg Chest Port 1 View  11/09/2015  CLINICAL DATA:  Pneumonia, history hypertension, prostate cancer, cardiomyopathy, smoker EXAM: PORTABLE CHEST 1 VIEW COMPARISON:  Portable exam 0424 hours compared to 11/08/2015 FINDINGS: Tip of endotracheal tube projects 3.8 cm above carina. Nasogastric tube extends into abdomen. External pacing leads in EKG leads noted. LEFT jugular central venous catheter tip projects over SVC. Minimal enlargement of cardiac silhouette with slight pulmonary vascular congestion. Mediastinal contour stable. Perihilar to basilar infiltrates bilaterally favoring pulmonary edema. No definite pleural effusion or pneumothorax. IMPRESSION: Suspected pulmonary edema, little changed. Electronically Signed   By: Lavonia Dana M.D.   On: 11/09/2015 07:05   Dg Chest Port 1 View  11/08/2015  CLINICAL DATA:  Pneumonia EXAM: PORTABLE CHEST 1 VIEW COMPARISON:  November 07, 2015 FINDINGS: The ET and NG tubes are again identified. A left central line is stable. A transcutaneous pacemaker pad is identified. An intra-aortic balloon pump is again identified with the distal tip 3.7 cm below the top of the aortic arch. This has probably been pulled back slightly in the interval. Stable cardiomegaly. No pulmonary nodules or masses. The focal opacity in the medial right  lung base is improved but not completely resolve. Mild edema is suspected as well. IMPRESSION: The support apparatus is in good position. Improving focal opacity in the right infrahilar region. Mild edema. Electronically Signed   By: Dorise Bullion III M.D   On: 11/08/2015 08:03   Dg Chest Port 1 View  11/07/2015  CLINICAL DATA:  Respiratory failure. EXAM: PORTABLE CHEST 1 VIEW COMPARISON:  11/07/2015. FINDINGS: Endotracheal tube, NG tube, left IJ line stable position. Balloon pump in stable position. Cardiomegaly with normal pulmonary vascularity. Right lower lobe infiltrate consistent pneumonia. No pleural effusion or pneumothorax . IMPRESSION: 1. Lines and tubes stable position. Balloon pump in stable position. 2. Persistent right lower lobe infiltrate consistent pneumonia. Similar findings noted on prior exam. 3. Cardiomegaly.  No pulmonary venous congestion . Electronically Signed   By: Marcello Moores  Register   On: 11/07/2015 07:14   Dg Chest Port 1 View  11/08/2015  CLINICAL DATA:  Evaluate intra-aortic balloon pump placement. EXAM: PORTABLE CHEST 1 VIEW COMPARISON:  11/18/2015 at 8:08 a.m. FINDINGS: The metallic portion of the intra-aortic balloon pump is superimposed upon the inferior margin of the aortic knob, well positioned.  Endotracheal tube, left internal jugular central venous line and oral/ nasogastric tube are stable. There is improved right lung aeration when compared the prior study. There is significantly less airspace opacification. Residual airspace opacification is noted at the right base. IMPRESSION: 1. Metal tip of the intra-aortic balloon pump projects along the inferior margin of the aortic knob. 2. Significant improvement in right lung aeration since the earlier exam. No new lung abnormalities. Electronically Signed   By: Lajean Manes M.D.   On: 12/02/2015 19:16   Dg Chest Port 1 View  12/01/2015  CLINICAL DATA:  Central line placement EXAM: PORTABLE CHEST 1 VIEW COMPARISON:  Chest x-ray  from earlier same day and from 12/05/2015. FINDINGS: Heart size is stable. Overall cardiomediastinal silhouette is stable in size and configuration. Endotracheal tube remains well positioned with tip approximately 3 cm above the level of the carina. Nasogastric tube passes below the diaphragm. New left IJ central line in place with tip adequately positioned over the mid SVC. No pneumothorax seen. Diffuse airspace opacities are again noted throughout the right lung. Again noted is a probable small right pleural effusion, unchanged. No new lung findings. IMPRESSION: 1. New left IJ central line appears adequately positioned with tip projected over the mid SVC. No pneumothorax. 2. Otherwise stable chest x-ray. Diffuse airspace opacities throughout the right lung, most prominent at the right lung base, unchanged in the short-term interval, most suggestive of pneumonia versus asymmetric pulmonary edema. Probable small right pleural effusion. Left lung remains clear. Electronically Signed   By: Franki Cabot M.D.   On: 11/18/2015 08:21   Dg Chest Port 1 View  11/12/2015  CLINICAL DATA:  Respiratory failure. EXAM: PORTABLE CHEST 1 VIEW COMPARISON:  11/18/2015. FINDINGS: Endotracheal tube, NG tube in stable position. Heart size stable. Stable cardiomegaly. Diffuse right lung infiltrate, all slight interim improvement. Mild atelectasis and/or infiltrate left lung base. Small right pleural effusion cannot be excluded. No pneumothorax . Previously identified tiny metallic density noted along the right lateral chest well no longer identified. IMPRESSION: 1. Lines and tubes in stable position. 2. Diffuse right lung infiltrate, slight interim improvement. Mild left lower lobe atelectasis and/or infiltrate. Small right pleural effusion cannot be excluded. Electronically Signed   By: Marcello Moores  Register   On: 11/17/2015 07:16   Dg Chest Portable 1 View  11/21/2015  CLINICAL DATA:  Intubation. Respiratory distress. History of  hypertension, prostate cancer. EXAM: PORTABLE CHEST 1 VIEW COMPARISON:  Chest radiograph September 08, 2011 FINDINGS: Endotracheal tube tip projects 2.3 cm above the carina. Nasogastric tube past the proximal stomach, distal tip not imaged. Multiple pacer pads and wires overlie the chest. The cardiac silhouette appears moderately enlarged, mediastinal silhouette is nonsuspicious. Diffuse interstitial prominence, RIGHT greater than LEFT with pulmonary vascular congestion. No pleural effusions though LEFT costophrenic angle is not imaged. No pneumothorax. Small metallic linear density projecting at RIGHT chest wall may be the external to the patient or, represent needle fragment. Recommend direct inspection. IMPRESSION: Endotracheal tube tip projects 2.3 cm above the carina. Nasogastric tube past proximal stomach. Increasing cardiomegaly and findings of asymmetric interstitial pulmonary edema. Electronically Signed   By: Elon Alas M.D.   On: 11/27/2015 22:59   Dg Abd Portable 1v  11/08/2015  CLINICAL DATA:  70 year old male status post intubation and enteric tube placement. Respiratory distress. EXAM: PORTABLE ABDOMEN - 1 VIEW COMPARISON:  None. FINDINGS: An endotracheal tube is partially visualized extending into the left hemi abdomen with tip positioned in the left lower abdomen  overlying the iliac crest. Normal caliber air-filled loops of small bowel noted. There is cardiomegaly. There is diffuse hazy density of the right lung base. A 2 cm linear radiopaque density over the right lung base may be superimposed on the patient. Clinical correlation is recommended. IMPRESSION: Enteric tube the tip in the left lower abdomen superimposed over the iliac crest. Electronically Signed   By: Anner Crete M.D.   On: 11/15/2015 23:02   Ct Portable Head W/o Cm  11/07/2015  CLINICAL DATA:  Altered mental status EXAM: CT HEAD WITHOUT CONTRAST TECHNIQUE: Contiguous paraaxial images were obtained from the base of the  skull through the vertex without intravenous contrast. COMPARISON:  None. FINDINGS: There is mild diffuse atrophy. There is no intracranial mass, hemorrhage, extra-axial fluid collection, or midline shift. There are lacunar type infarcts in the centra semiovale bilaterally. There is patchy small vessel disease in the centra semiovale bilaterally. There is no demonstrable acute infarct. The bony calvarium appears intact. The mastoid air cells are clear. No intraorbital lesions are evident. 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 No acute infarct evident. No hemorrhage or mass effect. Electronically Signed   By: Lowella Grip III M.D.   On: 11/07/2015 13:40    PHYSICAL EXAM CVP 7 General: Intubated, sedated.  Neck:  Left TLC, no thyromegaly or thyroid nodule. ETT with blood noted. Lungs: Coarse BS bilaterally. CV: Lateral PMI.  Heart regular S1/S2, no S3/S4.  No peripheral edema.   Abdomen: Soft, no hepatosplenomegaly, no distention.  Neurologic: Not following commands, gaze to upper left.   Extremities: No clubbing or cyanosis. R and LLE SCDs.  GU: Foley yellow urine.   TELEMETRY: Reviewed telemetry pt in NSR  ASSESSMENT AND PLAN: 70 yo with history of prior prostate cancer treated with radiation, known CAD, and active smoking was found by friends on 3/2 to be in respiratory distress. To ER, intubated, NSTEMI with troponin >65. He had CPR for PEA x 2, IABP placed.  Suspect co-existing severe PNA and NSTEMI.  1. ID: PNA on CXR, PCT > 175, Strep pneumo urine antigen positive.  Prbably has PNA co-existing with MI and mixed septic/cardiogenic shock.   - Broad spectrum abx per CCM.  2. Cardiogenic (+/- septic) shock: Echo with EF 15-20%.  Off pressors, IABP out.  Stable off milrinone. Todays CO-OX 71%. CVP 7.  - Continue hydralazine to 37.5 mg tid and isordil to 20 tid. 3. CAD: Suspect NSTEMI with TnI now >  65 and low EF.  No cath/intervention yet.  - Will need angiography if/when more stable.  - Continue ASA 81, statin.   4. AKI: Suspect triggered by shock.  Renal following, may end up needing CVVH but still making urine.  5. Neuro: Now s/p PEA arrest with CPR x 2.  Not following commands and gazes left.  Head MRI with multiple CVAs.  Suspect will need neuro evaluation. Prognosis concerning. 6. Thrombocytopenia: Fall in plts thought to be related to IABP/sepsis but plts continue to come down even off IABP.  He has been on heparin.  Off heparin since 3/4 . HIT panel pending.     7. Elevated LFTs: Shock liver. LFTs now coming down. 8. Anemia: Hemoglobin trending down.8.5> 7.5  Hemodynamically stable but remains with multi-system organ failure and possible anoxic brain injury. Prognosis quite tenuous.   The patient is critically ill with multiple organ systems failure and requires high complexity decision making for assessment  and support, frequent evaluation and titration of therapies, application of advanced monitoring technologies and extensive interpretation of multiple databases.   Amy Clegg NP-C  11/11/2015 7:07 AM  Patient seen with NP, agree with the above note.  Stable off milrinone.  BP ok this morning on hydralazine/Imdur.    Still not following commands, concern for anoxic brain injury.   Hemoglobin falling, some hemoptysis.  Off heparin gtt.  Plts stably low, pending HIT.   Creatinine down a bit, CVP 7.  Does not need diuretic at this time.   Loralie Champagne 11/11/2015 7:28 AM

## 2015-11-11 NOTE — Progress Notes (Signed)
Nutrition Follow-up  DOCUMENTATION CODES:   Underweight, Severe malnutrition in context of chronic illness  INTERVENTION:   Continue Vital AF 1.2 @ 55 ml/hr Provides: 1584 kcal (102% of needs), 99 grams protein, and 1070 ml H2O. Total free water: 1670 ml   NUTRITION DIAGNOSIS:   Malnutrition related to chronic illness as evidenced by severe depletion of body fat, severe depletion of muscle mass. Ongoing.   GOAL:   Patient will meet greater than or equal to 90% of their needs Met.   MONITOR:   TF tolerance, Skin, Vent status, Labs  ASSESSMENT:   Pt admitted on 11/28/2015 with SOB. He is noted s/p PEA arrest on IABP and with AKI and PNA.   Patient is currently intubated on ventilator support MV: 5.5 L/min Temp (24hrs), Avg:98.5 F (36.9 C), Min:98.1 F (36.7 C), Max:99 F (37.2 C)  3/4 off balloon pump Free water: 200 ml TID = 600 ml OG tube: Vital AF 1.2 @ 55 Labs reviewed: sodium elevated 147, potassium low 3.4, magnesium and phosphorus are WNL Weight appears stable. Concern for anoxic brain injury  Diet Order:  Diet NPO time specified  Skin:  Wound (see comment) (stage I sacrum)  Last BM:  3/3  Height:   Ht Readings from Last 1 Encounters:  11/29/2015 _0  (1.753 m)    Weight:   Wt Readings from Last 1 Encounters:  11/11/15 123 lb 3.8 oz (55.9 kg)    Ideal Body Weight:  72.7 kg  BMI:  Body mass index is 18.19 kg/(m^2).  Estimated Nutritional Needs:   Kcal:  0230  Protein:  85-100  Fluid:  >1.5 L/day  EDUCATION NEEDS:   No education needs identified at this time  [place], [place], [place] Pager [phone] After Hours Pager

## 2015-11-12 ENCOUNTER — Inpatient Hospital Stay (HOSPITAL_COMMUNITY): Payer: Medicare Other

## 2015-11-12 LAB — CARBOXYHEMOGLOBIN
Carboxyhemoglobin: 1.6 % — ABNORMAL HIGH (ref 0.5–1.5)
Methemoglobin: 0.9 % (ref 0.0–1.5)
O2 Saturation: 51.8 %
TOTAL HEMOGLOBIN: 6.8 g/dL — AB (ref 13.5–18.0)

## 2015-11-12 LAB — MAGNESIUM: MAGNESIUM: 1.9 mg/dL (ref 1.7–2.4)

## 2015-11-12 LAB — PREPARE RBC (CROSSMATCH)

## 2015-11-12 LAB — CBC
HCT: 19.2 % — ABNORMAL LOW (ref 39.0–52.0)
HEMOGLOBIN: 6.7 g/dL — AB (ref 13.0–17.0)
MCH: 28.4 pg (ref 26.0–34.0)
MCHC: 34.9 g/dL (ref 30.0–36.0)
MCV: 81.4 fL (ref 78.0–100.0)
PLATELETS: 87 10*3/uL — AB (ref 150–400)
RBC: 2.36 MIL/uL — AB (ref 4.22–5.81)
RDW: 14.3 % (ref 11.5–15.5)
WBC: 8.3 10*3/uL (ref 4.0–10.5)

## 2015-11-12 LAB — GLUCOSE, CAPILLARY
GLUCOSE-CAPILLARY: 115 mg/dL — AB (ref 65–99)
GLUCOSE-CAPILLARY: 135 mg/dL — AB (ref 65–99)
GLUCOSE-CAPILLARY: 164 mg/dL — AB (ref 65–99)
GLUCOSE-CAPILLARY: 123 mg/dL — AB (ref 65–99)
GLUCOSE-CAPILLARY: 148 mg/dL — AB (ref 65–99)
Glucose-Capillary: 146 mg/dL — ABNORMAL HIGH (ref 65–99)
Glucose-Capillary: 144 mg/dL — ABNORMAL HIGH (ref 65–99)

## 2015-11-12 LAB — RENAL FUNCTION PANEL
ALBUMIN: 1.8 g/dL — AB (ref 3.5–5.0)
ANION GAP: 10 (ref 5–15)
BUN: 49 mg/dL — ABNORMAL HIGH (ref 6–20)
CALCIUM: 8.1 mg/dL — AB (ref 8.9–10.3)
CO2: 31 mmol/L (ref 22–32)
Chloride: 104 mmol/L (ref 101–111)
Creatinine, Ser: 2.89 mg/dL — ABNORMAL HIGH (ref 0.61–1.24)
GFR, EST AFRICAN AMERICAN: 24 mL/min — AB (ref >60)
GFR, EST NON AFRICAN AMERICAN: 21 mL/min — AB (ref >60)
Glucose, Bld: 136 mg/dL — ABNORMAL HIGH (ref 65–99)
Phosphorus: 3.2 mg/dL (ref 2.5–4.6)
Potassium: 3.9 mmol/L (ref 3.5–5.1)
SODIUM: 145 mmol/L (ref 135–145)

## 2015-11-12 LAB — ABO/RH: ABO/RH(D): B NEG

## 2015-11-12 MED ORDER — SODIUM CHLORIDE 0.9 % IV SOLN: Freq: Once | INTRAVENOUS | Status: AC

## 2015-11-12 NOTE — Progress Notes (Signed)
eLink Physician-Brief Progress Note Patient Name: ENNIO WOULARD DOB: [birth year] MRN: [id number]   Date of Service  11/12/2015  HPI/Events of Note  Anemia - Hgb = 6.7.  eICU Interventions  Will transfuse 1 unit PRBC now.      Intervention Category Major Interventions: Other:  Lysle Dingwall 11/12/2015, 4:47 AM

## 2015-11-12 NOTE — Progress Notes (Signed)
PULMONARY / CRITICAL CARE MEDICINE   Name: William Mccullough MRN: [id number] DOB: [birth year]    ADMISSION DATE:  11/26/2015 CONSULTATION DATE:  11/22/2015  REFERRING MD:  EDP  CHIEF COMPLAINT:  SOB  HISTORY OF PRESENT ILLNESS:  Pt is encephelopathic; therefore, this HPI is obtained from chart review. William Mccullough is a 70 y.o. male with PMH as outlined below. Found down, MODS, cardiogenic shock.  SUBJECTIVE:  No major events. Blood for ETT decreasing.   VITAL SIGNS: BP 142/77 mmHg  Pulse 55  Temp(Src) 97.6 F (36.4 C) (Oral)  Resp 12  Ht 5\' 9"  (1.753 m)  Wt 120 lb 9.5 oz (54.7 kg)  BMI 17.80 kg/m2  SpO2 99%  HEMODYNAMICS: CVP:  [5 mmHg-9 mmHg] 7 mmHg  VENTILATOR SETTINGS: Vent Mode:  [-] PRVC FiO2 (%):  [40 %] 40 % Set Rate:  [12 bmp] 12 bmp Vt Set:  [500 mL] 500 mL PEEP:  [5 cmH20] 5 cmH20 Plateau Pressure:  [18 cmH20-23 cmH20] 19 cmH20  INTAKE / OUTPUT: I/O last 3 completed shifts: In: 2801.9 [I.V.:781.9; Blood:30; NG/GT:1840; IV Piggyback:150] Out: 2175 [Urine:2175]   PHYSICAL EXAMINATION: General: AA male, No purposeful response to commands Neuro: Moves all four extremities.  HEENT: No thyromegaly or JVD,  Cardiovascular: RRR, no MRG Lungs: Clear, no MRG Abdomen: Soft, + BS, NT/ND Musculoskeletal: No gross deformities, no edema.  Skin: No rashes.  LABS:  BMET  Recent Labs Lab 11/10/15 0447 11/11/15 0445 11/12/15 0330  NA 143 147* 145  K 3.5 3.4* 3.9  CL 102 104 104  CO2 32 31 31  BUN 45* 45* 49*  CREATININE 3.56* 3.30* 2.89*  GLUCOSE 101* 88 136*    Electrolytes  Recent Labs Lab 11/07/15 0325 11/07/15 1649  11/10/15 0447 11/11/15 0445 11/12/15 0330  CALCIUM 7.7* 7.6*  < > 8.0* 8.2* 8.1*  MG  --  1.6*  --   --  1.9 1.9  PHOS 2.8  --   --   --  3.7 3.2  < > = values in this interval not displayed.  CBC  Recent Labs Lab 11/10/15 1645 11/11/15 0445 11/12/15 0330  WBC 11.9* 9.1 8.3  HGB 8.5* 7.5* 6.7*  HCT 25.3* 22.2* 19.2*   PLT 70* 70* 87*    Coag's  Recent Labs Lab 11/07/15 1225 11/08/15 0800 11/10/15 1645  APTT >200*  --  34  INR 2.49* 1.39 1.15    Sepsis Markers  Recent Labs Lab 11/14/2015 0120 11/24/2015 0245 11/07/15 0325 11/07/15 1225 11/07/15 1600 11/08/15 0357  LATICACIDVEN 6.5* 7.5*  --  2.7* 2.6*  --   PROCALCITON 1.66  --  >175.00  --   --  149.51    ABG  Recent Labs Lab 11/08/15 0835 11/08/15 0845 11/08/15 2209  PHART 7.380 7.630* 7.424  PCO2ART 56.1* 25.4* 48.8*  PO2ART 43.0* 32.0* 89.3    Liver Enzymes  Recent Labs Lab 11/08/15 0357 11/09/15 0415 11/10/15 0447 11/11/15 0445 11/12/15 0330  AST 307* 114* 48*  --   --   ALT 414* 268* 163*  --   --   ALKPHOS 65 59 63  --   --   BILITOT 1.2 0.6 0.6  --   --   ALBUMIN 2.2* 2.1* 2.0* 1.9* 1.8*    Cardiac Enzymes  Recent Labs Lab 12/05/2015 0958 12/02/2015 1150 11/18/2015 1833  TROPONINI >65.00* >65.00* >65.00*    Glucose  Recent Labs Lab 11/11/15 0822 11/11/15 1218 11/11/15 1641 11/11/15 1927 11/11/15 2318 11/12/15  0838  GLUCAP 85 194* 192* 115* 146* 164*    Imaging Dg Chest Port 1 View  11/12/2015  CLINICAL DATA:  Respiratory failure. EXAM: PORTABLE CHEST 1 VIEW COMPARISON:  11/11/2015. FINDINGS: Endotracheal tube, NG tube in stable position. Cardiomegaly. Improving bilateral pulmonary infiltrates consistent with improving pulmonary edema. Small left pleural effusion noted. No pneumothorax . IMPRESSION: 1. Lines and tubes stable position. 2. Cardiomegaly with improving bilateral pulmonary infiltrates consistent with improving pulmonary edema. Tiny residual small left pleural effusion. Electronically Signed   By: Marcello Moores  Register   On: 11/12/2015 07:00   STUDIES:  CXR 03/01 > cardiomegaly with asymmetric interstitial edema right side. Echo pa 33, EF global 15% Cath neg eeg 3/3 >>>diffuse cerebral dysfunction that is non-specific in etiology and can be seen with hypoxic/ischemic injury, toxic/metabolic  encephalopathies, or medication effect from Fentanyl Ct head 3/3>>> Atrophy with prior small basal ganglia infarcts bilaterally as well as patchy periventricular small vessel disease. No acute infarct MRI head 3/5 >> Multiple B/L acute/subacute infarcts, petechial hemorrhage.  CULTURES: Blood 03/01 >>> Urine 03/01 >>> Sputum 03/01 >>> Leg>>>neg Strep>>>POS  ANTIBIOTICS: Vanc 03/02 >>>3/5 Zosyn 03/02 >>>3/3 cefriaxone 3/3>>>  SIGNIFICANT EVENTS: 03/02 > admitted with acute hypoxic respiratory failure requiring intubation.  Had brief code post intubation. 3/2- balloon pump, renal failure 3/4 - off pressors, balloon pump out  LINES/TUBES: ETT 03/02 > Left IJ 3/2>>> Aline 3/2>>>3/4  ASSESSMENT / PLAN:  PULMONARY A: Acute hypoxic respiratory failure - required intubation in ED. PNA-CAP Blood from ETT > Likely from suction trauma in setting of low platelets P:   On min support Daily wake up and breath assessments Poor mental status is a barrier to weaning. Monitor blood from ETT. Minimize suctioning.  CARDIOVASCULAR A:  Septic shock, septic cardiomyoapthy Brief cardiac arrest post intubation - ROSC after 1 - 2 min Troponin leak - suspect due to demand ischemia. Hx HTN, cardiomyopathy (last echo from 2013 with EF 40-45%, grade 1DD.  Of note, per EDP, bedside cardiac POCUS revealed very poor EF estimated at 10-15%). P:  Balloon pump, out Off milrinone. Lasix as needed to keep I/O even.   RENAL A:   AKI, ATN AGMA - lactate. POS balance, chf P:   No acute need for dialysis Monitor urine output and Cr.  Free water for increased Na K repleted  GASTROINTESTINAL A:   GI prophylaxis. Nutrition. Shock liver >> Improving LFTs P:   SUP: Pantoprazole. TF to goal  HEMATOLOGIC / ONCOLOGIC A:   VTE Prophylaxis. Hx prostate CA - last seen in 2013 and was doing well at that time. Thrombocytopenia *(sepsis) R/o DIC component- neg Coagulapathy improved P:   SCD's Off S.Q heparin given the low platelets. HIT ab negative. Transfuse 1 unit PRBC.   INFECTIOUS A:   Septic shock, Pneumonia, strep pos ag Possible pneumococcal meningitis. Unable to LP now as low platelets and is low yield. Already on empiric treatment of pneumococcus P:   Maintain cefriaxone, Day 7 of abx. Will give for total 14 days   ENDOCRINE A:   Hyperglycemia - no hx DM. P:   SSI Cortisol 15. Will use stress dose steroids if hypotensive.   NEUROLOGIC A:   Acute metabolic encephalopathy. ? Anoxic injury Multiple embolic strokes.  Hx anxiety, depression. At risk meningitis? Poor baseline status P:   Sedation:   Wean fentanyl gtt Off versed RASS goal: 0  Daily WUA  meningitis.  MRI results noted. Check carotid US  Family updated: Niece updated with  nurse at bedside 3/6, 3/8. Discussed that we are concerned that his poor mental status as a result of anoxic injury and multiple infarct will make liberation from vent very difficult. I dont think he is a good candidate for a trach given the underlying poor functional status. Further I reccommended that he not be resuscitated if he suffers another cardiac arrest. She want to discuss with the rest of the family and get back to [place] regarding goals of care.   Interdisciplinary Family Meeting v Palliative Care Meeting:  Due by: 03/08.  CC time: 35 minutes  Marshell Garfinkel MD [facility] Pager [phone] If no answer or after 3pm call: [phone] 11/12/2015, 9:13 AM

## 2015-11-12 NOTE — Care Management Important Message (Signed)
Important Message  Patient Details  Name: William Mccullough MRN: [id number] Date of Birth: [birth year]   Medicare Important Message Given:  Yes    Barb Merino [place] 11/12/2015, 9:48 AM

## 2015-11-12 NOTE — Progress Notes (Signed)
[facility] Rounding Note  Subjective:   On precedex and fentanyl When sedation lightened no change Eyes open, not tracking, not following commands, does respond to pain Transfused 1 unit for Hb 6.9 1.7 liters UOP   Objective Vital signs in last 24 hours: Filed Vitals:   11/12/15 1100 11/12/15 1111 11/12/15 1134 11/12/15 1200  BP: 139/68 139/68  155/78  Pulse: 51 51  55  Temp:   97.9 F (36.6 C)   TempSrc:   Oral   Resp: 14 13  13   Height:      Weight:      SpO2: 97% 96%  97%   Weight change: -1.2 kg (-2 lb 10.3 oz)  Intake/Output Summary (Last 24 hours) at 11/12/15 1221 Last data filed at 11/12/15 1200  Gross per 24 hour  Intake 2549.65 ml  Output   1505 ml  Net 1044.65 ml   Physical Exam:  BP 155/78 mmHg  Pulse 55  Temp(Src) 97.9 F (36.6 C) (Oral)  Resp 13  Ht 5\' 9"  (1.753 m)  Wt 54.7 kg (120 lb 9.5 oz)  BMI 17.80 kg/m2  SpO2 97%  CVP 7-11 ETT, OGT Neck: Left triple lumen line in place   Regular S1/S2, no S3/S4. Abdomen: Soft, non-distended.  Neurologic: Not following commands + ankle edema only SCD's in place  Labs: Basic Metabolic Panel:  Recent Labs Lab 11/23/2015 0245  11/27/2015 1437 11/13/2015 1833 11/07/15 0325 11/07/15 1649 11/08/15 0357 11/09/15 0415 11/10/15 0447 11/11/15 0445 11/12/15 0330  NA 143  < > 144 142 141 143 140 141 143 147* 145  K 3.9  < > 3.4* 3.5 3.8 3.8 4.0 3.5 3.5 3.4* 3.9  CL 109  < > 105 102 99* 97* 98* 98* 102 104 104  CO2 17*  < > 28 27 26 30 28 31  32 31 31  GLUCOSE 140*  < > 130* 109* 98 112* 148* 140* 101* 88 136*  BUN 14  < > 21* 23* 28* 36* 43* 47* 45* 45* 49*  CREATININE 1.69*  < > 2.41* 2.61* 2.92* 3.54* 3.61* 3.71* 3.56* 3.30* 2.89*  CALCIUM 8.5*  < > 8.2* 8.0* 7.7* 7.6* 7.5* 8.2* 8.0* 8.2* 8.1*  PHOS 5.3*  --  3.0 2.7 2.8  --   --   --   --  3.7 3.2  < > = values in this interval not displayed.    Recent Labs Lab 11/08/15 0357 11/09/15 0415 11/10/15 0447 11/11/15 0445 11/12/15 0330  AST 307* 114*  48*  --   --   ALT 414* 268* 163*  --   --   ALKPHOS 65 59 63  --   --   BILITOT 1.2 0.6 0.6  --   --   PROT 4.6* 4.8* 4.6*  --   --   ALBUMIN 2.2* 2.1* 2.0* 1.9* 1.8*      Recent Labs Lab 11/17/2015 2239  11/07/15 0325  11/09/15 0415 11/10/15 0447 11/10/15 1645 11/11/15 0445 11/12/15 0330  WBC 20.9*  < > 13.6*  < > 17.6* 14.2* 11.9* 9.1 8.3  NEUTROABS 16.5*  --  12.1*  --  15.9* 12.0*  --   --   --   HGB 12.9*  < > 9.8*  < > 9.1* 8.2* 8.5* 7.5* 6.7*  HCT 37.7*  < > 28.6*  < > 26.6* 23.8* 25.3* 22.2* 19.2*  MCV 84.2  < > 78.8  < > 79.9 80.7 80.6 80.1 81.4  PLT 175  < >  86*  < > 73* 67* 70* 70* 87*  < > = values in this interval not displayed.    Recent Labs Lab 11/26/2015 0245 11/22/2015 0657 11/29/2015 0958 11/17/2015 1150 11/15/2015 1833  TROPONINI 10.60* 31.57* >65.00* >65.00* >65.00*      Recent Labs Lab 11/11/15 1641 11/11/15 1927 11/11/15 2318 11/12/15 0331 11/12/15 0838  GLUCAP 192* 115* 146* 135* 164*    Studies/Results: Dg Chest Port 1 View  11/12/2015  CLINICAL DATA:  Respiratory failure. EXAM: PORTABLE CHEST 1 VIEW COMPARISON:  11/11/2015. FINDINGS: Endotracheal tube, NG tube in stable position. Cardiomegaly. Improving bilateral pulmonary infiltrates consistent with improving pulmonary edema. Small left pleural effusion noted. No pneumothorax . IMPRESSION: 1. Lines and tubes stable position. 2. Cardiomegaly with improving bilateral pulmonary infiltrates consistent with improving pulmonary edema. Tiny residual small left pleural effusion. Electronically Signed   By: Marcello Moores  Register   On: 11/12/2015 07:00   Dg Chest Port 1 View  11/11/2015  CLINICAL DATA:  Acute respiratory failure.  Shortness breath. EXAM: PORTABLE CHEST 1 VIEW COMPARISON:  11/10/2015. FINDINGS: Endotracheal tube, NG tube, left IJ line in stable position. Cardiomegaly with progressive diffuse bilateral pulmonary infiltrates. Progression of small pleural effusions. No pneumothorax. IMPRESSION: 1.  Lines and tubes in stable position. 2. Cardiomegaly with diffuse progression of bilateral pulmonary infiltrates consistent with diffuse bilateral pulmonary edema. Bilateral small pleural effusions are noted. Electronically Signed   By: Marcello Moores  Register   On: 11/11/2015 07:07   Medications: . sodium chloride Stopped (11/09/15 0800)  . sodium chloride 10 mL/hr at 11/10/15 0203  . dexmedetomidine 0.25 mcg/kg/hr (11/12/15 0800)  . feeding supplement (VITAL AF 1.2 CAL) 1,000 mL (11/12/15 0800)  . fentaNYL infusion INTRAVENOUS 75 mcg/hr (11/12/15 0800)   . antiseptic oral rinse  7 mL Mouth Rinse 10 times per day  . aspirin  81 mg Per Tube Daily  . atorvastatin  40 mg Per Tube q1800  . cefTRIAXone (ROCEPHIN)  IV  2 g Intravenous Q12H  . chlorhexidine gluconate  15 mL Mouth Rinse BID  . free water  200 mL Per Tube 3 times per day  . hydrALAZINE  37.5 mg Per Tube 3 times per day  . insulin aspart  0-15 Units Subcutaneous 6 times per day  . isosorbide dinitrate  20 mg Per Tube TID  . pantoprazole sodium  40 mg Per Tube QHS  . sodium chloride flush  10-40 mL Intracatheter Q12H  . sodium chloride flush  3 mL Intravenous Q12H   Background 70 yr male with hx Prostate Ca, s/p Radrx, hx depression, pancreatitis, HTN (on ARB PTA). Baseline CKD 3 (1.2-1.6)  Presented with resp distress, had PEA arrest in the ED, pressor dependent hypotension - septic (PNA with + S pneumo)  and cardiogenic shock (EF 15-20%), NSTEMI. Required pressors, inotropes, IABP. Developed AKI on CKD likely ischemic ATN. Multiple strokes and possible anoxic brain injury   Assessment/Recommendations  1. AKI on CKD3 - likely ischemic ATN d/t shock. Creatinine peaked at around 3.7 on 3/5 and is slowly trending down - 2.89 today. UOP good 2.7 liters/24 hours with lasix 40 yesterday.   No dialysis or CRRT indications.  Given question of anoxic brain injury/multiple strokes on MRI - would be a VERY poor candidate for dialysis in the short or  long run.Marland KitchenMarland KitchenFortunately no need to address that now. 2. Volume - CCM/cards managing diuretics 3. VDRF - PNA/CAP. S Pneumo +  Poor mental status limits weaning 4. Septic shock d/t PNA 5. Cardiogenic  shock - EF 15-20% with RV dysfunction as well. IABP out. Milrinone stopped 3/6. 6. Shock liver - some improvement in LFT's 7. Acute metabolic encephalopathy - felt anoxic brain injury. Multiple strokes on MRI. Prognosis would appear poor... 8. Bilateral pulmonary infiltrates. ? Edema? (but CVP only 5-7). Bleeding from ETT - ? pulm hemorrhage. 9. Anemia - hb steadily trending down. Received a unit of blood this AM.  From a renal perspective I am offering little. His creatinine peaked at 3.7 and has been steadily (albeit slowly) declining past 3 days. UOP good in response to small doses of lasix - getting prn by CCM.  Unless there is evidence of substantial neurologic recovery I don't see him as a dialysis candidate.  At this time I will sign off. If further needs arise that I can assist with, please do not hesitate to call me.    William Maes, MD [facility] [phone] pager 11/12/2015, 12:21 PM

## 2015-11-12 NOTE — Progress Notes (Signed)
Patient ID: William Mccullough, male   DOB: 12-12-1945, 70 y.o.   MRN: [id number]   SUBJECTIVE:  3/2: Respiratory arrest with intubation followed by PEA arrest x 2.  IABP placed.  Pressors begun.  CXR with RLL PNA. PCT > 175. Strep pneumo urine antigen+, flu -.  Troponin > 65   3/3: IABP out.   Echo: EF 15-20% with decreased RV function.   Remains on vent. Sedated. Awakens but doesn't follow commands. Todays CO-OX is 52%.     Creatinine 3.56> 3.3>2.89   Scheduled Meds: . antiseptic oral rinse  7 mL Mouth Rinse 10 times per day  . aspirin  81 mg Per Tube Daily  . atorvastatin  40 mg Per Tube q1800  . cefTRIAXone (ROCEPHIN)  IV  2 g Intravenous Q12H  . chlorhexidine gluconate  15 mL Mouth Rinse BID  . free water  200 mL Per Tube 3 times per day  . hydrALAZINE  37.5 mg Per Tube 3 times per day  . insulin aspart  0-15 Units Subcutaneous 6 times per day  . isosorbide dinitrate  20 mg Per Tube TID  . pantoprazole sodium  40 mg Per Tube QHS  . sodium chloride flush  10-40 mL Intracatheter Q12H  . sodium chloride flush  3 mL Intravenous Q12H   Continuous Infusions: . sodium chloride Stopped (11/09/15 0800)  . sodium chloride 10 mL/hr at 11/10/15 0203  . dexmedetomidine 0.5 mcg/kg/hr (11/12/15 0333)  . feeding supplement (VITAL AF 1.2 CAL) 1,000 mL (11/11/15 2114)  . fentaNYL infusion INTRAVENOUS 75 mcg/hr (11/12/15 0645)   PRN Meds:.sodium chloride, Place/Maintain arterial line **AND** sodium chloride, albuterol, fentaNYL, hydrALAZINE, Influenza vac split quadrivalent PF, pneumococcal 23 valent vaccine, sodium chloride flush   Filed Vitals:   11/12/15 0500 11/12/15 0600 11/12/15 0634 11/12/15 0653  BP: 86/63 99/54 108/60 130/70  Pulse: 56 54 52 53  Temp:   97.4 F (36.3 C) 97.4 F (36.3 C)  TempSrc:   Axillary Axillary  Resp: 12 12 12 12   Height:      Weight:      SpO2: 98% 98% 100%     Intake/Output Summary (Last 24 hours) at 11/12/15 0701 Last data filed at 11/12/15 0653  Gross per 24 hour  Intake 1901.17 ml  Output   1755 ml  Net 146.17 ml    LABS: Basic Metabolic Panel:  Recent Labs  11/11/15 0445 11/12/15 0330  NA 147* 145  K 3.4* 3.9  CL 104 104  CO2 31 31  GLUCOSE 88 136*  BUN 45* 49*  CREATININE 3.30* 2.89*  CALCIUM 8.2* 8.1*  MG 1.9 1.9  PHOS 3.7 3.2   Liver Function Tests:  Recent Labs  11/10/15 0447 11/11/15 0445 11/12/15 0330  AST 48*  --   --   ALT 163*  --   --   ALKPHOS 63  --   --   BILITOT 0.6  --   --   PROT 4.6*  --   --   ALBUMIN 2.0* 1.9* 1.8*   No results for input(s): LIPASE, AMYLASE in the last 72 hours. CBC:  Recent Labs  11/10/15 0447  11/11/15 0445 11/12/15 0330  WBC 14.2*  < > 9.1 8.3  NEUTROABS 12.0*  --   --   --   HGB 8.2*  < > 7.5* 6.7*  HCT 23.8*  < > 22.2* 19.2*  MCV 80.7  < > 80.1 81.4  PLT 67*  < > 70* 87*  < > =  values in this interval not displayed. Cardiac Enzymes: No results for input(s): CKTOTAL, CKMB, CKMBINDEX, TROPONINI in the last 72 hours. BNP: Invalid input(s): POCBNP D-Dimer: No results for input(s): DDIMER in the last 72 hours. Hemoglobin A1C: No results for input(s): HGBA1C in the last 72 hours. Fasting Lipid Panel: No results for input(s): CHOL, HDL, LDLCALC, TRIG, CHOLHDL, LDLDIRECT in the last 72 hours. Thyroid Function Tests: No results for input(s): TSH, T4TOTAL, T3FREE, THYROIDAB in the last 72 hours.  Invalid input(s): FREET3 Anemia Panel: No results for input(s): VITAMINB12, FOLATE, FERRITIN, TIBC, IRON, RETICCTPCT in the last 72 hours.  RADIOLOGY: Mr Herby Abraham Contrast  11/10/2015  CLINICAL DATA:  Initial evaluation for acute stroke. EXAM: MRI HEAD WITHOUT CONTRAST TECHNIQUE: Multiplanar, multiecho pulse sequences of the brain and surrounding structures were obtained without intravenous contrast. COMPARISON:  Prior CT from 11/07/2015. FINDINGS: Study mildly degraded by motion artifact. Diffuse prominence of the CSF containing spaces compatible with  generalized cerebral atrophy. Patchy and confluent T2/FLAIR hyperintensity within the periventricular and deep white matter both cerebral hemispheres most consistent with chronic small vessel ischemic disease, moderate nature. Multiple superimposed remote lacunar infarcts involve the bilateral basal ganglia and thalami. Probable small remote lacunar infarcts within the pons as well. Confluent area of restricted diffusion within the high left frontal parietal region measures 11 x 17 x 13 mm. This demonstrates central decreased ADC signal, consistent with acute/ early subacute ischemic infarct. Probable subtle associated petechial hemorrhage without hemorrhagic transformation (series 8, image 19). Few smaller additional punctate cortical infarcts present within the left frontal parietal region. Small punctate cortical infarct more posteriorly within the left parietal lobe (Series 4, image 32). Scattered largely cortical ischemic infarcts within the bilateral occipital regions (Series 4, image 21, 20 in). There are patchy multi focal ischemic infarcts involving the bilateral cerebellar hemispheres, left worse than right. Punctate infarcts extend along the midline in the cerebellar vermis as well. Corresponding ADC signal about these infarcts appears to largely be resolving at this time, suggesting at these are early subacute in nature. Probable faint associated petechial hemorrhage within the left cerebellar hemisphere without hemorrhagic transformation (series 8, image 5). There is associated T2/FLAIR signal intensity about these infarcts with minimal localized edema without significant mass effect. Major intracranial vascular flow voids are maintained. No mass lesion, midline shift, or mass effect. Ventricular prominence related to global parenchymal volume loss present without hydrocephalus. No extra-axial fluid collection. Major dural sinuses are patent. Craniocervical junction within normal limits. Visualized  upper cervical spine unremarkable. Pituitary gland normal.  No acute abnormality about the orbits. Scattered mucosal thickening within the ethmoidal air cells and sphenoid sinuses. Fluid within the nasopharynx. Patient likely is intubated. Scattered opacity within the bilateral mastoid air cells. Inner ear structures grossly normal. Bone marrow signal intensity within normal limits. No scalp soft tissue abnormality. IMPRESSION: 1. Scattered acute/early subacute ischemic infarcts involving the left frontoparietal region, the bilateral occipital lobes, and bilateral cerebellar hemispheres as above. No significant mass effect. Probable faint petechial hemorrhage with the dominant left frontoparietal and left cerebellar infarcts as above. No evidence for frank hemorrhagic transformation. 2. Advanced cerebral atrophy with moderate chronic small vessel ischemic disease and multiple remote lacunar infarcts involving the bilateral basal ganglia, thalami, and pons. Electronically Signed   By: Jeannine Boga M.D.   On: 11/10/2015 02:57   US Renal  11/18/2015  CLINICAL DATA:  Acute renal injury EXAM: RENAL / URINARY TRACT ULTRASOUND COMPLETE COMPARISON:  None. FINDINGS: Right Kidney: Length: 10.8 cm.  Echogenicity within normal limits. No mass or hydronephrosis visualized. Left Kidney: Length: 10.3 cm. No hydronephrosis is noted. Calcifications are identified within the renal pelvis consistent with renal calculi. The largest of these measures 9 mm. Bladder: Decompressed by Foley catheter. IMPRESSION: Left renal calculi without acute abnormality. Electronically Signed   By: Inez Catalina M.D.   On: 12/04/2015 18:54   Dg Chest Port 1 View  11/11/2015  CLINICAL DATA:  Acute respiratory failure.  Shortness breath. EXAM: PORTABLE CHEST 1 VIEW COMPARISON:  11/10/2015. FINDINGS: Endotracheal tube, NG tube, left IJ line in stable position. Cardiomegaly with progressive diffuse bilateral pulmonary infiltrates. Progression of  small pleural effusions. No pneumothorax. IMPRESSION: 1. Lines and tubes in stable position. 2. Cardiomegaly with diffuse progression of bilateral pulmonary infiltrates consistent with diffuse bilateral pulmonary edema. Bilateral small pleural effusions are noted. Electronically Signed   By: Marcello Moores  Register   On: 11/11/2015 07:07   Dg Chest Port 1 View  11/10/2015  CLINICAL DATA:  70 year old male with respiratory failure, intubated. Cerebral infarcts. Initial encounter. EXAM: PORTABLE CHEST 1 VIEW COMPARISON:  11/09/2015 and earlier. FINDINGS: Portable AP semi upright view at 0449 hours. Stable endotracheal tube tip between the level the clavicles and carina. Enteric tube courses to the left upper quadrant, tip not included. Stable left IJ central line. Pacer/resuscitation pads have been removed. Right mid and lower lung veiling opacity has regressed since 11/22/2015. There is persistent dense left lung base opacity which obscures the left hemidiaphragm and has mildly progressed since 11/08/2015. No pneumothorax. Pulmonary vascularity is within normal limits. IMPRESSION: 1.  Stable lines and tubes. 2. Left lower lobe collapse or consolidation appears progressed since 11/08/2015. 3. Improved right lung base ventilation since 11/20/2015. Electronically Signed   By: Genevie Ann M.D.   On: 11/10/2015 07:11   Dg Chest Port 1 View  11/09/2015  CLINICAL DATA:  Pneumonia, history hypertension, prostate cancer, cardiomyopathy, smoker EXAM: PORTABLE CHEST 1 VIEW COMPARISON:  Portable exam 0424 hours compared to 11/08/2015 FINDINGS: Tip of endotracheal tube projects 3.8 cm above carina. Nasogastric tube extends into abdomen. External pacing leads in EKG leads noted. LEFT jugular central venous catheter tip projects over SVC. Minimal enlargement of cardiac silhouette with slight pulmonary vascular congestion. Mediastinal contour stable. Perihilar to basilar infiltrates bilaterally favoring pulmonary edema. No definite pleural  effusion or pneumothorax. IMPRESSION: Suspected pulmonary edema, little changed. Electronically Signed   By: Lavonia Dana M.D.   On: 11/09/2015 07:05   Dg Chest Port 1 View  11/08/2015  CLINICAL DATA:  Pneumonia EXAM: PORTABLE CHEST 1 VIEW COMPARISON:  November 07, 2015 FINDINGS: The ET and NG tubes are again identified. A left central line is stable. A transcutaneous pacemaker pad is identified. An intra-aortic balloon pump is again identified with the distal tip 3.7 cm below the top of the aortic arch. This has probably been pulled back slightly in the interval. Stable cardiomegaly. No pulmonary nodules or masses. The focal opacity in the medial right lung base is improved but not completely resolve. Mild edema is suspected as well. IMPRESSION: The support apparatus is in good position. Improving focal opacity in the right infrahilar region. Mild edema. Electronically Signed   By: Dorise Bullion III M.D   On: 11/08/2015 08:03   Dg Chest Port 1 View  11/07/2015  CLINICAL DATA:  Respiratory failure. EXAM: PORTABLE CHEST 1 VIEW COMPARISON:  11/10/2015. FINDINGS: Endotracheal tube, NG tube, left IJ line stable position. Balloon pump in stable position. Cardiomegaly with normal pulmonary  vascularity. Right lower lobe infiltrate consistent pneumonia. No pleural effusion or pneumothorax . IMPRESSION: 1. Lines and tubes stable position. Balloon pump in stable position. 2. Persistent right lower lobe infiltrate consistent pneumonia. Similar findings noted on prior exam. 3. Cardiomegaly.  No pulmonary venous congestion . Electronically Signed   By: Marcello Moores  Register   On: 11/07/2015 07:14   Dg Chest Port 1 View  12/04/2015  CLINICAL DATA:  Evaluate intra-aortic balloon pump placement. EXAM: PORTABLE CHEST 1 VIEW COMPARISON:  11/13/2015 at 8:08 a.m. FINDINGS: The metallic portion of the intra-aortic balloon pump is superimposed upon the inferior margin of the aortic knob, well positioned. Endotracheal tube, left internal  jugular central venous line and oral/ nasogastric tube are stable. There is improved right lung aeration when compared the prior study. There is significantly less airspace opacification. Residual airspace opacification is noted at the right base. IMPRESSION: 1. Metal tip of the intra-aortic balloon pump projects along the inferior margin of the aortic knob. 2. Significant improvement in right lung aeration since the earlier exam. No new lung abnormalities. Electronically Signed   By: Lajean Manes M.D.   On: 12/03/2015 19:16   Dg Chest Port 1 View  11/09/2015  CLINICAL DATA:  Central line placement EXAM: PORTABLE CHEST 1 VIEW COMPARISON:  Chest x-ray from earlier same day and from 11/08/2015. FINDINGS: Heart size is stable. Overall cardiomediastinal silhouette is stable in size and configuration. Endotracheal tube remains well positioned with tip approximately 3 cm above the level of the carina. Nasogastric tube passes below the diaphragm. New left IJ central line in place with tip adequately positioned over the mid SVC. No pneumothorax seen. Diffuse airspace opacities are again noted throughout the right lung. Again noted is a probable small right pleural effusion, unchanged. No new lung findings. IMPRESSION: 1. New left IJ central line appears adequately positioned with tip projected over the mid SVC. No pneumothorax. 2. Otherwise stable chest x-ray. Diffuse airspace opacities throughout the right lung, most prominent at the right lung base, unchanged in the short-term interval, most suggestive of pneumonia versus asymmetric pulmonary edema. Probable small right pleural effusion. Left lung remains clear. Electronically Signed   By: Franki Cabot M.D.   On: 12/04/2015 08:21   Dg Chest Port 1 View  12/04/2015  CLINICAL DATA:  Respiratory failure. EXAM: PORTABLE CHEST 1 VIEW COMPARISON:  11/20/2015. FINDINGS: Endotracheal tube, NG tube in stable position. Heart size stable. Stable cardiomegaly. Diffuse right  lung infiltrate, all slight interim improvement. Mild atelectasis and/or infiltrate left lung base. Small right pleural effusion cannot be excluded. No pneumothorax . Previously identified tiny metallic density noted along the right lateral chest well no longer identified. IMPRESSION: 1. Lines and tubes in stable position. 2. Diffuse right lung infiltrate, slight interim improvement. Mild left lower lobe atelectasis and/or infiltrate. Small right pleural effusion cannot be excluded. Electronically Signed   By: Marcello Moores  Register   On: 11/30/2015 07:16   Dg Chest Portable 1 View  11/18/2015  CLINICAL DATA:  Intubation. Respiratory distress. History of hypertension, prostate cancer. EXAM: PORTABLE CHEST 1 VIEW COMPARISON:  Chest radiograph September 08, 2011 FINDINGS: Endotracheal tube tip projects 2.3 cm above the carina. Nasogastric tube past the proximal stomach, distal tip not imaged. Multiple pacer pads and wires overlie the chest. The cardiac silhouette appears moderately enlarged, mediastinal silhouette is nonsuspicious. Diffuse interstitial prominence, RIGHT greater than LEFT with pulmonary vascular congestion. No pleural effusions though LEFT costophrenic angle is not imaged. No pneumothorax. Small metallic linear density  projecting at RIGHT chest wall may be the external to the patient or, represent needle fragment. Recommend direct inspection. IMPRESSION: Endotracheal tube tip projects 2.3 cm above the carina. Nasogastric tube past proximal stomach. Increasing cardiomegaly and findings of asymmetric interstitial pulmonary edema. Electronically Signed   By: Elon Alas M.D.   On: 11/07/2015 22:59   Dg Abd Portable 1v  11/28/2015  CLINICAL DATA:  70 year old male status post intubation and enteric tube placement. Respiratory distress. EXAM: PORTABLE ABDOMEN - 1 VIEW COMPARISON:  None. FINDINGS: An endotracheal tube is partially visualized extending into the left hemi abdomen with tip positioned in the  left lower abdomen overlying the iliac crest. Normal caliber air-filled loops of small bowel noted. There is cardiomegaly. There is diffuse hazy density of the right lung base. A 2 cm linear radiopaque density over the right lung base may be superimposed on the patient. Clinical correlation is recommended. IMPRESSION: Enteric tube the tip in the left lower abdomen superimposed over the iliac crest. Electronically Signed   By: Anner Crete M.D.   On: 11/16/2015 23:02   Ct Portable Head W/o Cm  11/07/2015  CLINICAL DATA:  Altered mental status EXAM: CT HEAD WITHOUT CONTRAST TECHNIQUE: Contiguous paraaxial images were obtained from the base of the skull through the vertex without intravenous contrast. COMPARISON:  None. FINDINGS: There is mild diffuse atrophy. There is no intracranial mass, hemorrhage, extra-axial fluid collection, or midline shift. There are lacunar type infarcts in the centra semiovale bilaterally. There is patchy small vessel disease in the centra semiovale bilaterally. There is no demonstrable acute infarct. The bony calvarium appears intact. The mastoid air cells are clear. No intraorbital lesions are evident. 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 No acute infarct evident. No hemorrhage or mass effect. Electronically Signed   By: Lowella Grip III M.D.   On: 11/07/2015 13:40    PHYSICAL EXAM CVP 5 General: Intubated, sedated.  Neck:  Left TLC, no thyromegaly or thyroid nodule. ETT with blood noted. Lungs: Coarse BS bilaterally. CV: Lateral PMI.  Heart regular S1/S2, no S3/S4.  No peripheral edema.   Abdomen: Soft, no hepatosplenomegaly, no distention.  Neurologic: Not following commands, gaze to upper left.   Extremities: No clubbing or cyanosis. R and LLE SCDs.  GU: Foley yellow urine.   TELEMETRY: Sinus Brady 26s.   ASSESSMENT AND PLAN: 70 yo with history of prior prostate cancer  treated with radiation, known CAD, and active smoking was found by friends on 3/2 to be in respiratory distress. To ER, intubated, NSTEMI with troponin >65. He had CPR for PEA x 2, IABP placed.  Suspect co-existing severe PNA and NSTEMI.  1. ID: PNA on CXR, PCT > 175, Strep pneumo urine antigen positive.  Prbably has PNA co-existing with MI and mixed septic/cardiogenic shock.   - Broad spectrum abx per CCM.  2. Cardiogenic (+/- septic) shock: Echo with EF 15-20%.  Off pressors, IABP out.  Todays down to CO-OX 52%. CVP 5. No diuretics for now.  - Continue hydralazine to 37.5 mg tid and isordil to 20 tid. 3. CAD: Suspect NSTEMI with TnI now > 65 and low EF.  No cath/intervention yet.  - Will need angiography if/when more stable.  - Continue ASA 81, statin.   4. AKI: Suspect triggered by shock.  Renal following, may end up needing CVVH but still making urine.  5. Neuro: Now s/p PEA arrest with CPR x 2.  Not following commands and gazes left.  Head MRI with multiple CVAs.  Suspect will need neuro evaluation. Prognosis concerning. 6. Thrombocytopenia: Fall in plts thought to be related to IABP/sepsis but plts continue to come down even off IABP.  He has been on heparin.  Off heparin since 3/4 . HIT negative.      7. Elevated LFTs: Shock liver. LFTs now coming down. 8. Anemia: Hemoglobin trending down.8.5> 7.5>6.7 Receiving 1UPRBCs.   Hemodynamically stable but remains with multi-system organ failure and possible anoxic brain injury. Prognosis quite tenuous.   The patient is critically ill with multiple organ systems failure and requires high complexity decision making for assessment and support, frequent evaluation and titration of therapies, application of advanced monitoring technologies and extensive interpretation of multiple databases.   Amy Clegg NP-C  11/12/2015 7:01 AM   Patient seen with NP, agree with the above note.  HIT negative.  Bloody secretions from ETT and decreased hgb => getting 1  unit PRBCs.    Still not following any commands.  Poor prognosis, suspect anoxic encephalopathy.    Continue hydralazine/Imdur, no diuretic necessary today.   Loralie Champagne 11/12/2015 8:56 AM

## 2015-11-13 ENCOUNTER — Inpatient Hospital Stay (HOSPITAL_COMMUNITY): Payer: Medicare Other

## 2015-11-13 DIAGNOSIS — R4182 Altered mental status, unspecified: Secondary | ICD-10-CM

## 2015-11-13 LAB — RENAL FUNCTION PANEL
Albumin: 1.9 g/dL — ABNORMAL LOW (ref 3.5–5.0)
Anion gap: 11 (ref 5–15)
BUN: 48 mg/dL — ABNORMAL HIGH (ref 6–20)
CALCIUM: 8 mg/dL — AB (ref 8.9–10.3)
CO2: 29 mmol/L (ref 22–32)
CREATININE: 2.29 mg/dL — AB (ref 0.61–1.24)
Chloride: 101 mmol/L (ref 101–111)
GFR calc Af Amer: 32 mL/min — ABNORMAL LOW (ref >60)
GFR calc non Af Amer: 28 mL/min — ABNORMAL LOW (ref >60)
Glucose, Bld: 118 mg/dL — ABNORMAL HIGH (ref 65–99)
PHOSPHORUS: 3.5 mg/dL (ref 2.5–4.6)
Potassium: 4.2 mmol/L (ref 3.5–5.1)
Sodium: 141 mmol/L (ref 135–145)

## 2015-11-13 LAB — CBC
HEMATOCRIT: 22.4 % — AB (ref 39.0–52.0)
Hemoglobin: 7.8 g/dL — ABNORMAL LOW (ref 13.0–17.0)
MCH: 28.4 pg (ref 26.0–34.0)
MCHC: 34.8 g/dL (ref 30.0–36.0)
MCV: 81.5 fL (ref 78.0–100.0)
Platelets: 117 10*3/uL — ABNORMAL LOW (ref 150–400)
RBC: 2.75 MIL/uL — ABNORMAL LOW (ref 4.22–5.81)
RDW: 14.4 % (ref 11.5–15.5)
WBC: 9.9 10*3/uL (ref 4.0–10.5)

## 2015-11-13 LAB — BASIC METABOLIC PANEL
Anion gap: 10 (ref 5–15)
BUN: 48 mg/dL — ABNORMAL HIGH (ref 6–20)
CO2: 30 mmol/L (ref 22–32)
Calcium: 8 mg/dL — ABNORMAL LOW (ref 8.9–10.3)
Chloride: 102 mmol/L (ref 101–111)
Creatinine, Ser: 2.25 mg/dL — ABNORMAL HIGH (ref 0.61–1.24)
GFR calc Af Amer: 33 mL/min — ABNORMAL LOW (ref >60)
GFR calc non Af Amer: 28 mL/min — ABNORMAL LOW (ref >60)
Glucose, Bld: 115 mg/dL — ABNORMAL HIGH (ref 65–99)
Potassium: 4.2 mmol/L (ref 3.5–5.1)
Sodium: 142 mmol/L (ref 135–145)

## 2015-11-13 LAB — GLUCOSE, CAPILLARY
GLUCOSE-CAPILLARY: 151 mg/dL — AB (ref 65–99)
Glucose-Capillary: 131 mg/dL — ABNORMAL HIGH (ref 65–99)
Glucose-Capillary: 118 mg/dL — ABNORMAL HIGH (ref 65–99)
Glucose-Capillary: 182 mg/dL — ABNORMAL HIGH (ref 65–99)

## 2015-11-13 LAB — MAGNESIUM: Magnesium: 2 mg/dL (ref 1.7–2.4)

## 2015-11-13 LAB — PHOSPHORUS: PHOSPHORUS: 3.5 mg/dL (ref 2.5–4.6)

## 2015-11-13 MED ORDER — SPIRONOLACTONE 25 MG PO TABS
12.5000 mg | ORAL_TABLET | Freq: Every day | ORAL | Status: DC
  Administered 2015-11-13 – 2015-11-18 (×6): 12.5 mg
  Filled 2015-11-13 (×6): qty 1

## 2015-11-13 MED ORDER — FUROSEMIDE 10 MG/ML IJ SOLN
INTRAMUSCULAR | Status: AC
  Administered 2015-11-13: 40 mg via INTRAVENOUS
  Filled 2015-11-13: qty 4

## 2015-11-13 MED ORDER — FUROSEMIDE 10 MG/ML IJ SOLN
40.0000 mg | Freq: Once | INTRAMUSCULAR | Status: AC
  Administered 2015-11-13: 40 mg via INTRAVENOUS

## 2015-11-13 MED ORDER — DEXMEDETOMIDINE HCL IN NACL 400 MCG/100ML IV SOLN
0.0000 ug/kg/h | INTRAVENOUS | Status: DC
  Administered 2015-11-13: 0.6 ug/kg/h via INTRAVENOUS
  Administered 2015-11-13: 0.4 ug/kg/h via INTRAVENOUS
  Administered 2015-11-14: 0.9 ug/kg/h via INTRAVENOUS
  Administered 2015-11-14: 0.7 ug/kg/h via INTRAVENOUS
  Administered 2015-11-15: 1 ug/kg/h via INTRAVENOUS
  Administered 2015-11-15: 0.8 ug/kg/h via INTRAVENOUS
  Administered 2015-11-16: 0.5 ug/kg/h via INTRAVENOUS
  Filled 2015-11-13 (×8): qty 100

## 2015-11-13 NOTE — Progress Notes (Signed)
Preliminary results by tech - Carotid duplex completed. Bilateral carotid arteries demonstrate a mild amount of plaque with no evidence of a stenosis noted. Vertebral arteries demonstrate antegrade flow. Oda Cogan, BS, RDMS, RVT

## 2015-11-13 NOTE — Progress Notes (Signed)
Changed tube holder ? ?

## 2015-11-13 NOTE — Progress Notes (Signed)
Patient ID: William Mccullough, male   DOB: Oct 12, 1945, 70 y.o.   MRN: [id number]   SUBJECTIVE:  3/2: Respiratory arrest with intubation followed by PEA arrest x 2.  IABP placed.  Pressors begun.  CXR with RLL PNA. PCT > 175. Strep pneumo urine antigen+, flu -.  Troponin > 65   3/3: IABP out.   Echo: EF 15-20% with decreased RV function.   Remains on vent. Sedated. Awakens but doesn't follow commands. No changes, tube feeds going.     Creatinine 3.56> 3.3>2.89>2.29   Scheduled Meds: . antiseptic oral rinse  7 mL Mouth Rinse 10 times per day  . aspirin  81 mg Per Tube Daily  . atorvastatin  40 mg Per Tube q1800  . cefTRIAXone (ROCEPHIN)  IV  2 g Intravenous Q12H  . chlorhexidine gluconate  15 mL Mouth Rinse BID  . free water  200 mL Per Tube 3 times per day  . furosemide  40 mg Intravenous Once  . hydrALAZINE  37.5 mg Per Tube 3 times per day  . insulin aspart  0-15 Units Subcutaneous 6 times per day  . isosorbide dinitrate  20 mg Per Tube TID  . pantoprazole sodium  40 mg Per Tube QHS  . sodium chloride flush  10-40 mL Intracatheter Q12H  . sodium chloride flush  3 mL Intravenous Q12H  . spironolactone  12.5 mg Per Tube Daily   Continuous Infusions: . sodium chloride Stopped (11/09/15 0800)  . sodium chloride 10 mL/hr at 11/10/15 0203  . dexmedetomidine 0.5 mcg/kg/hr (11/13/15 0145)  . feeding supplement (VITAL AF 1.2 CAL) 1,000 mL (11/12/15 2000)  . fentaNYL infusion INTRAVENOUS 250 mcg/hr (11/13/15 0301)   PRN Meds:.sodium chloride, Place/Maintain arterial line **AND** sodium chloride, albuterol, fentaNYL, hydrALAZINE, Influenza vac split quadrivalent PF, pneumococcal 23 valent vaccine, sodium chloride flush   Filed Vitals:   11/13/15 0445 11/13/15 0500 11/13/15 0600 11/13/15 0730  BP:  124/71 109/58 117/62  Pulse:  64 63 62  Temp: 97.7 F (36.5 C)     TempSrc: Oral     Resp:  12 12 12   Height:      Weight:      SpO2:  95% 95% 97%    Intake/Output Summary (Last 24  hours) at 11/13/15 0737 Last data filed at 11/13/15 0600  Gross per 24 hour  Intake 2966.13 ml  Output   1160 ml  Net 1806.13 ml    LABS: Basic Metabolic Panel:  Recent Labs  11/12/15 0330 11/13/15 0401 11/13/15 0402  NA 145 142 141  K 3.9 4.2 4.2  CL 104 102 101  CO2 31 30 29   GLUCOSE 136* 115* 118*  BUN 49* 48* 48*  CREATININE 2.89* 2.25* 2.29*  CALCIUM 8.1* 8.0* 8.0*  MG 1.9 2.0  --   PHOS 3.2 3.5 3.5   Liver Function Tests:  Recent Labs  11/12/15 0330 11/13/15 0402  ALBUMIN 1.8* 1.9*   No results for input(s): LIPASE, AMYLASE in the last 72 hours. CBC:  Recent Labs  11/12/15 0330 11/13/15 0401  WBC 8.3 9.9  HGB 6.7* 7.8*  HCT 19.2* 22.4*  MCV 81.4 81.5  PLT 87* 117*   Cardiac Enzymes: No results for input(s): CKTOTAL, CKMB, CKMBINDEX, TROPONINI in the last 72 hours. BNP: Invalid input(s): POCBNP D-Dimer: No results for input(s): DDIMER in the last 72 hours. Hemoglobin A1C: No results for input(s): HGBA1C in the last 72 hours. Fasting Lipid Panel: No results for input(s): CHOL, HDL, LDLCALC, TRIG,  CHOLHDL, LDLDIRECT in the last 72 hours. Thyroid Function Tests: No results for input(s): TSH, T4TOTAL, T3FREE, THYROIDAB in the last 72 hours.  Invalid input(s): FREET3 Anemia Panel: No results for input(s): VITAMINB12, FOLATE, FERRITIN, TIBC, IRON, RETICCTPCT in the last 72 hours.  RADIOLOGY: Mr Herby Abraham Contrast  11/10/2015  CLINICAL DATA:  Initial evaluation for acute stroke. EXAM: MRI HEAD WITHOUT CONTRAST TECHNIQUE: Multiplanar, multiecho pulse sequences of the brain and surrounding structures were obtained without intravenous contrast. COMPARISON:  Prior CT from 11/07/2015. FINDINGS: Study mildly degraded by motion artifact. Diffuse prominence of the CSF containing spaces compatible with generalized cerebral atrophy. Patchy and confluent T2/FLAIR hyperintensity within the periventricular and deep white matter both cerebral hemispheres most  consistent with chronic small vessel ischemic disease, moderate nature. Multiple superimposed remote lacunar infarcts involve the bilateral basal ganglia and thalami. Probable small remote lacunar infarcts within the pons as well. Confluent area of restricted diffusion within the high left frontal parietal region measures 11 x 17 x 13 mm. This demonstrates central decreased ADC signal, consistent with acute/ early subacute ischemic infarct. Probable subtle associated petechial hemorrhage without hemorrhagic transformation (series 8, image 19). Few smaller additional punctate cortical infarcts present within the left frontal parietal region. Small punctate cortical infarct more posteriorly within the left parietal lobe (Series 4, image 32). Scattered largely cortical ischemic infarcts within the bilateral occipital regions (Series 4, image 21, 20 in). There are patchy multi focal ischemic infarcts involving the bilateral cerebellar hemispheres, left worse than right. Punctate infarcts extend along the midline in the cerebellar vermis as well. Corresponding ADC signal about these infarcts appears to largely be resolving at this time, suggesting at these are early subacute in nature. Probable faint associated petechial hemorrhage within the left cerebellar hemisphere without hemorrhagic transformation (series 8, image 5). There is associated T2/FLAIR signal intensity about these infarcts with minimal localized edema without significant mass effect. Major intracranial vascular flow voids are maintained. No mass lesion, midline shift, or mass effect. Ventricular prominence related to global parenchymal volume loss present without hydrocephalus. No extra-axial fluid collection. Major dural sinuses are patent. Craniocervical junction within normal limits. Visualized upper cervical spine unremarkable. Pituitary gland normal.  No acute abnormality about the orbits. Scattered mucosal thickening within the ethmoidal air cells  and sphenoid sinuses. Fluid within the nasopharynx. Patient likely is intubated. Scattered opacity within the bilateral mastoid air cells. Inner ear structures grossly normal. Bone marrow signal intensity within normal limits. No scalp soft tissue abnormality. IMPRESSION: 1. Scattered acute/early subacute ischemic infarcts involving the left frontoparietal region, the bilateral occipital lobes, and bilateral cerebellar hemispheres as above. No significant mass effect. Probable faint petechial hemorrhage with the dominant left frontoparietal and left cerebellar infarcts as above. No evidence for frank hemorrhagic transformation. 2. Advanced cerebral atrophy with moderate chronic small vessel ischemic disease and multiple remote lacunar infarcts involving the bilateral basal ganglia, thalami, and pons. Electronically Signed   By: Jeannine Boga M.D.   On: 11/10/2015 02:57   US Renal  12/02/2015  CLINICAL DATA:  Acute renal injury EXAM: RENAL / URINARY TRACT ULTRASOUND COMPLETE COMPARISON:  None. FINDINGS: Right Kidney: Length: 10.8 cm. Echogenicity within normal limits. No mass or hydronephrosis visualized. Left Kidney: Length: 10.3 cm. No hydronephrosis is noted. Calcifications are identified within the renal pelvis consistent with renal calculi. The largest of these measures 9 mm. Bladder: Decompressed by Foley catheter. IMPRESSION: Left renal calculi without acute abnormality. Electronically Signed   By: Linus Mako.D.  On: 11/08/2015 18:54   Dg Chest Port 1 View  11/13/2015  CLINICAL DATA:  70 year old male with respiratory failure and shortness of breath. Cerebral infarcts. Acute renal insufficiency. Initial encounter. EXAM: PORTABLE CHEST 1 VIEW COMPARISON:  11/11/2015 and earlier. FINDINGS: Portable AP semi upright view at 0433 hours. The patient is mildly rotated to the left. Stable left IJ central line. Enteric tube course to the abdomen, tip not included. Endotracheal tube tip projects over the  enteric tube it appears to be stable up the level the clavicles. Since yesterday worsening veiling opacity at both lung bases and obscuration of the diaphragm. Increased basilar predominant interstitial opacity in both lungs. No pneumothorax. Mediastinal contours appear grossly stable. IMPRESSION: 1.  Stable lines and tubes. 2. Worsening bibasilar ventilation since yesterday which appears related to a combination of pleural effusion and collapse/consolidation. 3. Worsening interstitial opacity with basilar predominance which could reflect worsening edema or bilateral infection. Electronically Signed   By: Genevie Ann M.D.   On: 11/13/2015 07:17   Dg Chest Port 1 View  11/12/2015  CLINICAL DATA:  Respiratory failure. EXAM: PORTABLE CHEST 1 VIEW COMPARISON:  11/11/2015. FINDINGS: Endotracheal tube, NG tube in stable position. Cardiomegaly. Improving bilateral pulmonary infiltrates consistent with improving pulmonary edema. Small left pleural effusion noted. No pneumothorax . IMPRESSION: 1. Lines and tubes stable position. 2. Cardiomegaly with improving bilateral pulmonary infiltrates consistent with improving pulmonary edema. Tiny residual small left pleural effusion. Electronically Signed   By: Marcello Moores  Register   On: 11/12/2015 07:00   Dg Chest Port 1 View  11/11/2015  CLINICAL DATA:  Acute respiratory failure.  Shortness breath. EXAM: PORTABLE CHEST 1 VIEW COMPARISON:  11/10/2015. FINDINGS: Endotracheal tube, NG tube, left IJ line in stable position. Cardiomegaly with progressive diffuse bilateral pulmonary infiltrates. Progression of small pleural effusions. No pneumothorax. IMPRESSION: 1. Lines and tubes in stable position. 2. Cardiomegaly with diffuse progression of bilateral pulmonary infiltrates consistent with diffuse bilateral pulmonary edema. Bilateral small pleural effusions are noted. Electronically Signed   By: Marcello Moores  Register   On: 11/11/2015 07:07   Dg Chest Port 1 View  11/10/2015  CLINICAL DATA:   70 year old male with respiratory failure, intubated. Cerebral infarcts. Initial encounter. EXAM: PORTABLE CHEST 1 VIEW COMPARISON:  11/09/2015 and earlier. FINDINGS: Portable AP semi upright view at 0449 hours. Stable endotracheal tube tip between the level the clavicles and carina. Enteric tube courses to the left upper quadrant, tip not included. Stable left IJ central line. Pacer/resuscitation pads have been removed. Right mid and lower lung veiling opacity has regressed since 11/15/2015. There is persistent dense left lung base opacity which obscures the left hemidiaphragm and has mildly progressed since 11/08/2015. No pneumothorax. Pulmonary vascularity is within normal limits. IMPRESSION: 1.  Stable lines and tubes. 2. Left lower lobe collapse or consolidation appears progressed since 11/08/2015. 3. Improved right lung base ventilation since 11/13/2015. Electronically Signed   By: Genevie Ann M.D.   On: 11/10/2015 07:11   Dg Chest Port 1 View  11/09/2015  CLINICAL DATA:  Pneumonia, history hypertension, prostate cancer, cardiomyopathy, smoker EXAM: PORTABLE CHEST 1 VIEW COMPARISON:  Portable exam 0424 hours compared to 11/08/2015 FINDINGS: Tip of endotracheal tube projects 3.8 cm above carina. Nasogastric tube extends into abdomen. External pacing leads in EKG leads noted. LEFT jugular central venous catheter tip projects over SVC. Minimal enlargement of cardiac silhouette with slight pulmonary vascular congestion. Mediastinal contour stable. Perihilar to basilar infiltrates bilaterally favoring pulmonary edema. No definite pleural effusion or pneumothorax.  IMPRESSION: Suspected pulmonary edema, little changed. Electronically Signed   By: Lavonia Dana M.D.   On: 11/09/2015 07:05   Dg Chest Port 1 View  11/08/2015  CLINICAL DATA:  Pneumonia EXAM: PORTABLE CHEST 1 VIEW COMPARISON:  November 07, 2015 FINDINGS: The ET and NG tubes are again identified. A left central line is stable. A transcutaneous pacemaker pad is  identified. An intra-aortic balloon pump is again identified with the distal tip 3.7 cm below the top of the aortic arch. This has probably been pulled back slightly in the interval. Stable cardiomegaly. No pulmonary nodules or masses. The focal opacity in the medial right lung base is improved but not completely resolve. Mild edema is suspected as well. IMPRESSION: The support apparatus is in good position. Improving focal opacity in the right infrahilar region. Mild edema. Electronically Signed   By: Dorise Bullion III M.D   On: 11/08/2015 08:03   Dg Chest Port 1 View  11/07/2015  CLINICAL DATA:  Respiratory failure. EXAM: PORTABLE CHEST 1 VIEW COMPARISON:  12/05/2015. FINDINGS: Endotracheal tube, NG tube, left IJ line stable position. Balloon pump in stable position. Cardiomegaly with normal pulmonary vascularity. Right lower lobe infiltrate consistent pneumonia. No pleural effusion or pneumothorax . IMPRESSION: 1. Lines and tubes stable position. Balloon pump in stable position. 2. Persistent right lower lobe infiltrate consistent pneumonia. Similar findings noted on prior exam. 3. Cardiomegaly.  No pulmonary venous congestion . Electronically Signed   By: Marcello Moores  Register   On: 11/07/2015 07:14   Dg Chest Port 1 View  11/20/2015  CLINICAL DATA:  Evaluate intra-aortic balloon pump placement. EXAM: PORTABLE CHEST 1 VIEW COMPARISON:  11/28/2015 at 8:08 a.m. FINDINGS: The metallic portion of the intra-aortic balloon pump is superimposed upon the inferior margin of the aortic knob, well positioned. Endotracheal tube, left internal jugular central venous line and oral/ nasogastric tube are stable. There is improved right lung aeration when compared the prior study. There is significantly less airspace opacification. Residual airspace opacification is noted at the right base. IMPRESSION: 1. Metal tip of the intra-aortic balloon pump projects along the inferior margin of the aortic knob. 2. Significant improvement  in right lung aeration since the earlier exam. No new lung abnormalities. Electronically Signed   By: Lajean Manes M.D.   On: 12/04/2015 19:16   Dg Chest Port 1 View  11/20/2015  CLINICAL DATA:  Central line placement EXAM: PORTABLE CHEST 1 VIEW COMPARISON:  Chest x-ray from earlier same day and from 11/09/2015. FINDINGS: Heart size is stable. Overall cardiomediastinal silhouette is stable in size and configuration. Endotracheal tube remains well positioned with tip approximately 3 cm above the level of the carina. Nasogastric tube passes below the diaphragm. New left IJ central line in place with tip adequately positioned over the mid SVC. No pneumothorax seen. Diffuse airspace opacities are again noted throughout the right lung. Again noted is a probable small right pleural effusion, unchanged. No new lung findings. IMPRESSION: 1. New left IJ central line appears adequately positioned with tip projected over the mid SVC. No pneumothorax. 2. Otherwise stable chest x-ray. Diffuse airspace opacities throughout the right lung, most prominent at the right lung base, unchanged in the short-term interval, most suggestive of pneumonia versus asymmetric pulmonary edema. Probable small right pleural effusion. Left lung remains clear. Electronically Signed   By: Franki Cabot M.D.   On: 12/04/2015 08:21   Dg Chest Port 1 View  11/10/2015  CLINICAL DATA:  Respiratory failure. EXAM: PORTABLE CHEST 1  VIEW COMPARISON:  11/18/2015. FINDINGS: Endotracheal tube, NG tube in stable position. Heart size stable. Stable cardiomegaly. Diffuse right lung infiltrate, all slight interim improvement. Mild atelectasis and/or infiltrate left lung base. Small right pleural effusion cannot be excluded. No pneumothorax . Previously identified tiny metallic density noted along the right lateral chest well no longer identified. IMPRESSION: 1. Lines and tubes in stable position. 2. Diffuse right lung infiltrate, slight interim improvement. Mild  left lower lobe atelectasis and/or infiltrate. Small right pleural effusion cannot be excluded. Electronically Signed   By: Marcello Moores  Register   On: 12/01/2015 07:16   Dg Chest Portable 1 View  11/12/2015  CLINICAL DATA:  Intubation. Respiratory distress. History of hypertension, prostate cancer. EXAM: PORTABLE CHEST 1 VIEW COMPARISON:  Chest radiograph September 08, 2011 FINDINGS: Endotracheal tube tip projects 2.3 cm above the carina. Nasogastric tube past the proximal stomach, distal tip not imaged. Multiple pacer pads and wires overlie the chest. The cardiac silhouette appears moderately enlarged, mediastinal silhouette is nonsuspicious. Diffuse interstitial prominence, RIGHT greater than LEFT with pulmonary vascular congestion. No pleural effusions though LEFT costophrenic angle is not imaged. No pneumothorax. Small metallic linear density projecting at RIGHT chest wall may be the external to the patient or, represent needle fragment. Recommend direct inspection. IMPRESSION: Endotracheal tube tip projects 2.3 cm above the carina. Nasogastric tube past proximal stomach. Increasing cardiomegaly and findings of asymmetric interstitial pulmonary edema. Electronically Signed   By: Elon Alas M.D.   On: 11/27/2015 22:59   Dg Abd Portable 1v  11/23/2015  CLINICAL DATA:  70 year old male status post intubation and enteric tube placement. Respiratory distress. EXAM: PORTABLE ABDOMEN - 1 VIEW COMPARISON:  None. FINDINGS: An endotracheal tube is partially visualized extending into the left hemi abdomen with tip positioned in the left lower abdomen overlying the iliac crest. Normal caliber air-filled loops of small bowel noted. There is cardiomegaly. There is diffuse hazy density of the right lung base. A 2 cm linear radiopaque density over the right lung base may be superimposed on the patient. Clinical correlation is recommended. IMPRESSION: Enteric tube the tip in the left lower abdomen superimposed over the iliac  crest. Electronically Signed   By: Anner Crete M.D.   On: 11/23/2015 23:02   Ct Portable Head W/o Cm  11/07/2015  CLINICAL DATA:  Altered mental status EXAM: CT HEAD WITHOUT CONTRAST TECHNIQUE: Contiguous paraaxial images were obtained from the base of the skull through the vertex without intravenous contrast. COMPARISON:  None. FINDINGS: There is mild diffuse atrophy. There is no intracranial mass, hemorrhage, extra-axial fluid collection, or midline shift. There are lacunar type infarcts in the centra semiovale bilaterally. There is patchy small vessel disease in the centra semiovale bilaterally. There is no demonstrable acute infarct. The bony calvarium appears intact. The mastoid air cells are clear. No intraorbital lesions are evident. 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 No acute infarct evident. No hemorrhage or mass effect. Electronically Signed   By: Lowella Grip III M.D.   On: 11/07/2015 13:40    PHYSICAL EXAM CVP 10-12 General: Intubated, sedated.  Neck:  Left TLC, no thyromegaly or thyroid nodule. ETT with blood noted. Lungs: Coarse BS bilaterally. CV: Lateral PMI.  Heart regular S1/S2, no S3/S4.  No peripheral edema.   Abdomen: Soft, no hepatosplenomegaly, no distention.  Neurologic: Not following commands, gaze to upper left.   Extremities: No clubbing or cyanosis. R and LLE SCDs.  GU:  Foley yellow urine.   TELEMETRY: Sinus   ASSESSMENT AND PLAN: 70 yo with history of prior prostate cancer treated with radiation, known CAD, and active smoking was found by friends on 3/2 to be in respiratory distress. To ER, intubated, NSTEMI with troponin >65. He had CPR for PEA x 2, IABP placed.  Suspect co-existing severe PNA and NSTEMI.  1. ID: PNA on CXR, PCT > 175, Strep pneumo urine antigen positive.  Probably has PNA co-existing with MI and mixed septic/cardiogenic shock.   - Broad spectrum  abx per CCM.  2. Cardiogenic (+/- septic) shock: Echo with EF 15-20%.  Off pressors, IABP out.  CVP 10-12. - Will give Lasix 40 mg IV x 1 now with elevated CVP.  - Add spironolactone 12.5 daily with better creatinine.  - Continue hydralazine 37.5 mg tid and isordil 20 tid. 3. CAD: Suspect NSTEMI with TnI now > 65 and low EF.  No cath/intervention yet.  - Will need angiography if/when more stable.  - Continue ASA 81, statin.   4. AKI: Suspect triggered by shock.  Creatinine gradually improving.  5. Neuro: Now s/p PEA arrest with CPR x 2.  Not following commands.  Head MRI with multiple CVAs.  At this point, think prognosis poor with anoxic encephalopathy.  6. Thrombocytopenia: Plts rising, HIT negative.      7. Elevated LFTs: Shock liver. LFTs now coming down. 8. Anemia: Received 1 unit PRBCs 3/8.  Hemoglobin higher today.  Still with some blood from ETT, ?suction trauma.   Hemodynamically stable but remains with multi-system organ failure and possible anoxic brain injury. Prognosis quite tenuous.   The patient is critically ill with multiple organ systems failure and requires high complexity decision making for assessment and support, frequent evaluation and titration of therapies, application of advanced monitoring technologies and extensive interpretation of multiple databases.   William Mccullough 11/13/2015 7:37 AM

## 2015-11-13 NOTE — Progress Notes (Signed)
PULMONARY / CRITICAL CARE MEDICINE   Name: William Mccullough MRN: [id number] DOB: 02-13-1946    ADMISSION DATE:  11/09/2015 CONSULTATION DATE:  11/18/2015  REFERRING MD:  EDP  CHIEF COMPLAINT:  SOB  HISTORY OF PRESENT ILLNESS:  Pt is encephelopathic; therefore, this HPI is obtained from chart review. William Mccullough is a 70 y.o. male with PMH as outlined below. Found down, MODS, cardiogenic shock.  SUBJECTIVE:  No major events. Blood for ETT decreasing.   VITAL SIGNS: BP 111/65 mmHg  Pulse 61  Temp(Src) 97.6 F (36.4 C) (Oral)  Resp 12  Ht 5\' 9"  (1.753 m)  Wt 123 lb 7.3 oz (56 kg)  BMI 18.22 kg/m2  SpO2 96%  HEMODYNAMICS: CVP:  [7 mmHg-15 mmHg] 15 mmHg  VENTILATOR SETTINGS: Vent Mode:  [-] PRVC FiO2 (%):  [40 %-50 %] 50 % Set Rate:  [12 bmp] 12 bmp Vt Set:  [460 mL-500 mL] 460 mL PEEP:  [5 cmH20] 5 cmH20 Plateau Pressure:  [14 cmH20-20 cmH20] 16 cmH20  INTAKE / OUTPUT: I/O last 3 completed shifts: In: [id number] [I.V.:770; Blood:365; VA:[id number]; IV Piggyback:150] Out: 1915 [Urine:1915]   PHYSICAL EXAMINATION: General: AA male, No purposeful response to commands Neuro: Moves all four extremities.  HEENT: No thyromegaly or JVD,  Cardiovascular: RRR, no MRG Lungs: Clear, no MRG Abdomen: Soft, + BS, NT/ND Musculoskeletal: No gross deformities, no edema.  Skin: No rashes.  LABS:  BMET  Recent Labs Lab 11/12/15 0330 11/13/15 0401 11/13/15 0402  NA 145 142 141  K 3.9 4.2 4.2  CL 104 102 101  CO2 31 30 29   BUN 49* 48* 48*  CREATININE 2.89* 2.25* 2.29*  GLUCOSE 136* 115* 118*    Electrolytes  Recent Labs Lab 11/11/15 0445 11/12/15 0330 11/13/15 0401 11/13/15 0402  CALCIUM 8.2* 8.1* 8.0* 8.0*  MG 1.9 1.9 2.0  --   PHOS 3.7 3.2 3.5 3.5    CBC  Recent Labs Lab 11/11/15 0445 11/12/15 0330 11/13/15 0401  WBC 9.1 8.3 9.9  HGB 7.5* 6.7* 7.8*  HCT 22.2* 19.2* 22.4*  PLT 70* 87* 117*    Coag's  Recent Labs Lab 11/07/15 1225 11/08/15 0800  11/10/15 1645  APTT >200*  --  34  INR 2.49* 1.39 1.15    Sepsis Markers  Recent Labs Lab 11/07/15 0325 11/07/15 1225 11/07/15 1600 11/08/15 0357  LATICACIDVEN  --  2.7* 2.6*  --   PROCALCITON >175.00  --   --  149.51    ABG  Recent Labs Lab 11/08/15 0835 11/08/15 0845 11/08/15 2209  PHART 7.380 7.630* 7.424  PCO2ART 56.1* 25.4* 48.8*  PO2ART 43.0* 32.0* 89.3    Liver Enzymes  Recent Labs Lab 11/08/15 0357 11/09/15 0415 11/10/15 0447 11/11/15 0445 11/12/15 0330 11/13/15 0402  AST 307* 114* 48*  --   --   --   ALT 414* 268* 163*  --   --   --   ALKPHOS 65 59 63  --   --   --   BILITOT 1.2 0.6 0.6  --   --   --   ALBUMIN 2.2* 2.1* 2.0* 1.9* 1.8* 1.9*    Cardiac Enzymes  Recent Labs Lab 11/13/2015 1150 11/24/2015 1833  TROPONINI >65.00* >65.00*    Glucose  Recent Labs Lab 11/12/15 1121 11/12/15 1608 11/12/15 2002 11/12/15 2342 11/13/15 0408 11/13/15 0831  GLUCAP 123* 144* 148* 131* 118* 182*    Imaging Dg Chest Port 1 View  11/13/2015  CLINICAL DATA:  70 year old  male with respiratory failure and shortness of breath. Cerebral infarcts. Acute renal insufficiency. Initial encounter. EXAM: PORTABLE CHEST 1 VIEW COMPARISON:  11/11/2015 and earlier. FINDINGS: Portable AP semi upright view at 0433 hours. The patient is mildly rotated to the left. Stable left IJ central line. Enteric tube course to the abdomen, tip not included. Endotracheal tube tip projects over the enteric tube it appears to be stable up the level the clavicles. Since yesterday worsening veiling opacity at both lung bases and obscuration of the diaphragm. Increased basilar predominant interstitial opacity in both lungs. No pneumothorax. Mediastinal contours appear grossly stable. IMPRESSION: 1.  Stable lines and tubes. 2. Worsening bibasilar ventilation since yesterday which appears related to a combination of pleural effusion and collapse/consolidation. 3. Worsening interstitial opacity  with basilar predominance which could reflect worsening edema or bilateral infection. Electronically Signed   By: Genevie Ann M.D.   On: 11/13/2015 07:17   STUDIES:  CXR 03/01 > cardiomegaly with asymmetric interstitial edema right side. Echo pa 33, EF global 15% Cath neg eeg 3/3 >>>diffuse cerebral dysfunction that is non-specific in etiology and can be seen with hypoxic/ischemic injury, toxic/metabolic encephalopathies, or medication effect from Fentanyl Ct head 3/3>>> Atrophy with prior small basal ganglia infarcts bilaterally as well as patchy periventricular small vessel disease. No acute infarct MRI head 3/5 >> Multiple B/L acute/subacute infarcts, petechial hemorrhage.  CULTURES: Blood 03/01 >>> Urine 03/01 >>> Sputum 03/01 >>> Leg>>>neg Strep>>>POS  ANTIBIOTICS: Vanc 03/02 >>>3/5 Zosyn 03/02 >>>3/3 cefriaxone 3/3>>>  SIGNIFICANT EVENTS: 03/02 > admitted with acute hypoxic respiratory failure requiring intubation.  Had brief code post intubation. 3/2- balloon pump, renal failure 3/4 - off pressors, balloon pump out  LINES/TUBES: ETT 03/02 > Left IJ 3/2>>> Aline 3/2>>>3/4  ASSESSMENT / PLAN:  PULMONARY A: Acute hypoxic respiratory failure - required intubation in ED. PNA-CAP Blood from ETT > Likely from suction trauma in setting of low platelets P:   On min support. CXR 3/9 worse > suspsect from overload. Will reassess tomorrow Daily wake up and breath assessments Poor mental status is a barrier to weaning. Monitor blood from ETT> improving. Minimize suctioning.  CARDIOVASCULAR A:  Septic shock, septic cardiomyoapthy Brief cardiac arrest post intubation - ROSC after 1 - 2 min Troponin leak - suspect due to demand ischemia. Hx HTN, cardiomyopathy (last echo from 2013 with EF 40-45%, grade 1DD.  Of note, per EDP, bedside cardiac POCUS revealed very poor EF estimated at 10-15%). P:  Balloon pump, out Off milrinone. Lasix as needed to keep I/O even. 40 mg once  today. Add spironolactone per cards  RENAL A:   AKI, ATN AGMA - lactate. POS balance, chf P:   No acute need for dialysis Monitor urine output and Cr.  Free water for increased Na K repleted  GASTROINTESTINAL A:   GI prophylaxis. Nutrition. Shock liver >> Improving LFTs P:   SUP: Pantoprazole. TF to goal  HEMATOLOGIC / ONCOLOGIC A:   VTE Prophylaxis. Hx prostate CA - last seen in 2013 and was doing well at that time. Thrombocytopenia *(sepsis) R/o DIC component- neg Coagulapathy improved P:  SCD's Off S.Q heparin given the low platelets. HIT ab negative. Hb is stable.  INFECTIOUS A:   Septic shock, Pneumonia, strep pos ag Possible pneumococcal meningitis. Unable to LP now as low platelets and is low yield. Already on empiric treatment of pneumococcus P:   Maintain cefriaxone, Day 8 of abx. Will give for total 14 days   ENDOCRINE A:   Hyperglycemia -  no hx DM. P:   SSI Cortisol 15. Will use stress dose steroids if hypotensive.   NEUROLOGIC A:   Acute metabolic encephalopathy. ? Anoxic injury Multiple embolic strokes.  Hx anxiety, depression. At risk meningitis? Poor baseline status P:   Sedation:   Wean fentanyl gtt Off versed RASS goal: 0  Daily WUA  meningitis.  MRI results noted. Check carotid US  Family updated: Niece updated with nurse at bedside 3/6, 3/8. Discussed that we are concerned that his poor mental status as a result of anoxic injury and multiple infarct will make liberation from vent very difficult. I dont think he is a good candidate for a trach given the underlying poor functional status. Further I reccommended that he not be resuscitated if he suffers another cardiac arrest. She want to discuss with the rest of the family and get back to [place] regarding goals of care.   Interdisciplinary Family Meeting v Palliative Care Meeting:  Due by: 03/08.  CC time: 35 minutes  Marshell Garfinkel MD [facility] Pager [phone] If no answer or after 3pm call: [phone] 11/13/2015, 11:11 AM

## 2015-11-13 NOTE — Progress Notes (Signed)
Pt vent continous alarming due to peak pressure changed out HME filters etc.. No change.  Called Charge RT [employer] and we agreed to change tidal volume to 460 due to event.  Pt tolerating much better now.

## 2015-11-14 ENCOUNTER — Inpatient Hospital Stay (HOSPITAL_COMMUNITY): Payer: Medicare Other

## 2015-11-14 DIAGNOSIS — J9600 Acute respiratory failure, unspecified whether with hypoxia or hypercapnia: Secondary | ICD-10-CM | POA: Insufficient documentation

## 2015-11-14 LAB — GLUCOSE, CAPILLARY
GLUCOSE-CAPILLARY: 128 mg/dL — AB (ref 65–99)
GLUCOSE-CAPILLARY: 131 mg/dL — AB (ref 65–99)
GLUCOSE-CAPILLARY: 143 mg/dL — AB (ref 65–99)
GLUCOSE-CAPILLARY: 150 mg/dL — AB (ref 65–99)
Glucose-Capillary: 124 mg/dL — ABNORMAL HIGH (ref 65–99)
Glucose-Capillary: 126 mg/dL — ABNORMAL HIGH (ref 65–99)
Glucose-Capillary: 129 mg/dL — ABNORMAL HIGH (ref 65–99)
Glucose-Capillary: 165 mg/dL — ABNORMAL HIGH (ref 65–99)

## 2015-11-14 LAB — RENAL FUNCTION PANEL
ANION GAP: 14 (ref 5–15)
Albumin: 2.1 g/dL — ABNORMAL LOW (ref 3.5–5.0)
BUN: 55 mg/dL — ABNORMAL HIGH (ref 6–20)
CHLORIDE: 98 mmol/L — AB (ref 101–111)
CO2: 29 mmol/L (ref 22–32)
Calcium: 8 mg/dL — ABNORMAL LOW (ref 8.9–10.3)
Creatinine, Ser: 2.19 mg/dL — ABNORMAL HIGH (ref 0.61–1.24)
GFR calc non Af Amer: 29 mL/min — ABNORMAL LOW (ref >60)
GFR, EST AFRICAN AMERICAN: 34 mL/min — AB (ref >60)
Glucose, Bld: 165 mg/dL — ABNORMAL HIGH (ref 65–99)
Phosphorus: 3.9 mg/dL (ref 2.5–4.6)
Potassium: 4 mmol/L (ref 3.5–5.1)
Sodium: 141 mmol/L (ref 135–145)

## 2015-11-14 LAB — CBC
HCT: 25.8 % — ABNORMAL LOW (ref 39.0–52.0)
Hemoglobin: 8.6 g/dL — ABNORMAL LOW (ref 13.0–17.0)
MCH: 27.3 pg (ref 26.0–34.0)
MCHC: 33.3 g/dL (ref 30.0–36.0)
MCV: 81.9 fL (ref 78.0–100.0)
PLATELETS: 182 10*3/uL (ref 150–400)
RBC: 3.15 MIL/uL — AB (ref 4.22–5.81)
RDW: 14.4 % (ref 11.5–15.5)
WBC: 13.7 10*3/uL — AB (ref 4.0–10.5)

## 2015-11-14 LAB — MAGNESIUM: MAGNESIUM: 1.8 mg/dL (ref 1.7–2.4)

## 2015-11-14 LAB — CARBOXYHEMOGLOBIN
CARBOXYHEMOGLOBIN: 2.1 % — AB (ref 0.5–1.5)
Methemoglobin: 0.8 % (ref 0.0–1.5)
O2 SAT: 59.7 %
TOTAL HEMOGLOBIN: 7.8 g/dL — AB (ref 13.5–18.0)

## 2015-11-14 NOTE — Progress Notes (Signed)
Patient ID: William Mccullough, male   DOB: October 30, 1945, 70 y.o.   MRN: [id number]   SUBJECTIVE:  3/2: Respiratory arrest with intubation followed by PEA arrest x 2.  IABP placed.  Pressors begun.  CXR with RLL PNA. PCT > 175. Strep pneumo urine antigen+, flu -.  Troponin > 65   3/3: IABP out.   Echo: EF 15-20% with decreased RV function.   Remains on vent. Sedated. Awakens but still not following commands. Was initially more lethargic this am.  Improved per Denyse Amass, RN but began to over-breathe vent, so sedation increased.     Creatinine 3.56> 3.3>2.89>2.29>2.19  Scheduled Meds: . antiseptic oral rinse  7 mL Mouth Rinse 10 times per day  . aspirin  81 mg Per Tube Daily  . atorvastatin  40 mg Per Tube q1800  . cefTRIAXone (ROCEPHIN)  IV  2 g Intravenous Q12H  . chlorhexidine gluconate  15 mL Mouth Rinse BID  . free water  200 mL Per Tube 3 times per day  . hydrALAZINE  37.5 mg Per Tube 3 times per day  . insulin aspart  0-15 Units Subcutaneous 6 times per day  . isosorbide dinitrate  20 mg Per Tube TID  . pantoprazole sodium  40 mg Per Tube QHS  . sodium chloride flush  10-40 mL Intracatheter Q12H  . sodium chloride flush  3 mL Intravenous Q12H  . spironolactone  12.5 mg Per Tube Daily   Continuous Infusions: . sodium chloride Stopped (11/09/15 0800)  . sodium chloride 10 mL/hr at 11/14/15 0800  . dexmedetomidine 0.9 mcg/kg/hr (11/14/15 0831)  . feeding supplement (VITAL AF 1.2 CAL) 1,000 mL (11/14/15 0800)  . fentaNYL infusion INTRAVENOUS 225 mcg/hr (11/14/15 0831)   PRN Meds:.sodium chloride, Place/Maintain arterial line **AND** sodium chloride, albuterol, fentaNYL, hydrALAZINE, Influenza vac split quadrivalent PF, pneumococcal 23 valent vaccine, sodium chloride flush   Filed Vitals:   11/14/15 0500 11/14/15 0600 11/14/15 0700 11/14/15 0825  BP: 154/84 107/68 100/56 193/88  Pulse: 111 76 69 126  Temp:   98.5 F (36.9 C)   TempSrc:   Oral   Resp: 24 11 15  32  Height:        Weight: 124 lb 9 oz (56.5 kg)     SpO2: 91% 96% 98% 96%    Intake/Output Summary (Last 24 hours) at 11/14/15 1003 Last data filed at 11/14/15 0800  Gross per 24 hour  Intake 2496.05 ml  Output   1705 ml  Net 791.05 ml    LABS: Basic Metabolic Panel:  Recent Labs  11/13/15 0401 11/13/15 0402 11/14/15 0515  NA 142 141 141  K 4.2 4.2 4.0  CL 102 101 98*  CO2 30 29 29   GLUCOSE 115* 118* 165*  BUN 48* 48* 55*  CREATININE 2.25* 2.29* 2.19*  CALCIUM 8.0* 8.0* 8.0*  MG 2.0  --  1.8  PHOS 3.5 3.5 3.9   Liver Function Tests:  Recent Labs  11/13/15 0402 11/14/15 0515  ALBUMIN 1.9* 2.1*   No results for input(s): LIPASE, AMYLASE in the last 72 hours. CBC:  Recent Labs  11/13/15 0401 11/14/15 0515  WBC 9.9 13.7*  HGB 7.8* 8.6*  HCT 22.4* 25.8*  MCV 81.5 81.9  PLT 117* 182   Cardiac Enzymes: No results for input(s): CKTOTAL, CKMB, CKMBINDEX, TROPONINI in the last 72 hours. BNP: Invalid input(s): POCBNP D-Dimer: No results for input(s): DDIMER in the last 72 hours. Hemoglobin A1C: No results for input(s): HGBA1C in the last 72  hours. Fasting Lipid Panel: No results for input(s): CHOL, HDL, LDLCALC, TRIG, CHOLHDL, LDLDIRECT in the last 72 hours. Thyroid Function Tests: No results for input(s): TSH, T4TOTAL, T3FREE, THYROIDAB in the last 72 hours.  Invalid input(s): FREET3 Anemia Panel: No results for input(s): VITAMINB12, FOLATE, FERRITIN, TIBC, IRON, RETICCTPCT in the last 72 hours.  RADIOLOGY: Mr Herby Abraham Contrast  11/10/2015  CLINICAL DATA:  Initial evaluation for acute stroke. EXAM: MRI HEAD WITHOUT CONTRAST TECHNIQUE: Multiplanar, multiecho pulse sequences of the brain and surrounding structures were obtained without intravenous contrast. COMPARISON:  Prior CT from 11/07/2015. FINDINGS: Study mildly degraded by motion artifact. Diffuse prominence of the CSF containing spaces compatible with generalized cerebral atrophy. Patchy and confluent T2/FLAIR  hyperintensity within the periventricular and deep white matter both cerebral hemispheres most consistent with chronic small vessel ischemic disease, moderate nature. Multiple superimposed remote lacunar infarcts involve the bilateral basal ganglia and thalami. Probable small remote lacunar infarcts within the pons as well. Confluent area of restricted diffusion within the high left frontal parietal region measures 11 x 17 x 13 mm. This demonstrates central decreased ADC signal, consistent with acute/ early subacute ischemic infarct. Probable subtle associated petechial hemorrhage without hemorrhagic transformation (series 8, image 19). Few smaller additional punctate cortical infarcts present within the left frontal parietal region. Small punctate cortical infarct more posteriorly within the left parietal lobe (Series 4, image 32). Scattered largely cortical ischemic infarcts within the bilateral occipital regions (Series 4, image 21, 20 in). There are patchy multi focal ischemic infarcts involving the bilateral cerebellar hemispheres, left worse than right. Punctate infarcts extend along the midline in the cerebellar vermis as well. Corresponding ADC signal about these infarcts appears to largely be resolving at this time, suggesting at these are early subacute in nature. Probable faint associated petechial hemorrhage within the left cerebellar hemisphere without hemorrhagic transformation (series 8, image 5). There is associated T2/FLAIR signal intensity about these infarcts with minimal localized edema without significant mass effect. Major intracranial vascular flow voids are maintained. No mass lesion, midline shift, or mass effect. Ventricular prominence related to global parenchymal volume loss present without hydrocephalus. No extra-axial fluid collection. Major dural sinuses are patent. Craniocervical junction within normal limits. Visualized upper cervical spine unremarkable. Pituitary gland normal.  No  acute abnormality about the orbits. Scattered mucosal thickening within the ethmoidal air cells and sphenoid sinuses. Fluid within the nasopharynx. Patient likely is intubated. Scattered opacity within the bilateral mastoid air cells. Inner ear structures grossly normal. Bone marrow signal intensity within normal limits. No scalp soft tissue abnormality. IMPRESSION: 1. Scattered acute/early subacute ischemic infarcts involving the left frontoparietal region, the bilateral occipital lobes, and bilateral cerebellar hemispheres as above. No significant mass effect. Probable faint petechial hemorrhage with the dominant left frontoparietal and left cerebellar infarcts as above. No evidence for frank hemorrhagic transformation. 2. Advanced cerebral atrophy with moderate chronic small vessel ischemic disease and multiple remote lacunar infarcts involving the bilateral basal ganglia, thalami, and pons. Electronically Signed   By: Jeannine Boga M.D.   On: 11/10/2015 02:57   US Renal  12/02/2015  CLINICAL DATA:  Acute renal injury EXAM: RENAL / URINARY TRACT ULTRASOUND COMPLETE COMPARISON:  None. FINDINGS: Right Kidney: Length: 10.8 cm. Echogenicity within normal limits. No mass or hydronephrosis visualized. Left Kidney: Length: 10.3 cm. No hydronephrosis is noted. Calcifications are identified within the renal pelvis consistent with renal calculi. The largest of these measures 9 mm. Bladder: Decompressed by Foley catheter. IMPRESSION: Left renal calculi without acute  abnormality. Electronically Signed   By: Inez Catalina M.D.   On: 12/02/2015 18:54   Dg Chest Port 1 View  11/14/2015  CLINICAL DATA:  Acute respiratory failure. EXAM: PORTABLE CHEST - 1 VIEW COMPARISON:  One-view chest x-ray 3/9/710. FINDINGS: Reversed lordotic positioning is evident. Despite this, diffuse edema and bilateral effusions have increased. The support apparatus is stable. Bibasilar airspace disease is present. IMPRESSION: 1. Progressive  interstitial edema and effusions compatible with congestive heart failure. 2. Associated bibasilar airspace disease. 3. The support apparatus is stable. Electronically Signed   By: San Morelle M.D.   On: 11/14/2015 07:16   Dg Chest Port 1 View  11/13/2015  CLINICAL DATA:  70 year old male with respiratory failure and shortness of breath. Cerebral infarcts. Acute renal insufficiency. Initial encounter. EXAM: PORTABLE CHEST 1 VIEW COMPARISON:  11/11/2015 and earlier. FINDINGS: Portable AP semi upright view at 0433 hours. The patient is mildly rotated to the left. Stable left IJ central line. Enteric tube course to the abdomen, tip not included. Endotracheal tube tip projects over the enteric tube it appears to be stable up the level the clavicles. Since yesterday worsening veiling opacity at both lung bases and obscuration of the diaphragm. Increased basilar predominant interstitial opacity in both lungs. No pneumothorax. Mediastinal contours appear grossly stable. IMPRESSION: 1.  Stable lines and tubes. 2. Worsening bibasilar ventilation since yesterday which appears related to a combination of pleural effusion and collapse/consolidation. 3. Worsening interstitial opacity with basilar predominance which could reflect worsening edema or bilateral infection. Electronically Signed   By: Genevie Ann M.D.   On: 11/13/2015 07:17   Dg Chest Port 1 View  11/12/2015  CLINICAL DATA:  Respiratory failure. EXAM: PORTABLE CHEST 1 VIEW COMPARISON:  11/11/2015. FINDINGS: Endotracheal tube, NG tube in stable position. Cardiomegaly. Improving bilateral pulmonary infiltrates consistent with improving pulmonary edema. Small left pleural effusion noted. No pneumothorax . IMPRESSION: 1. Lines and tubes stable position. 2. Cardiomegaly with improving bilateral pulmonary infiltrates consistent with improving pulmonary edema. Tiny residual small left pleural effusion. Electronically Signed   By: Marcello Moores  Register   On: 11/12/2015  07:00   Dg Chest Port 1 View  11/11/2015  CLINICAL DATA:  Acute respiratory failure.  Shortness breath. EXAM: PORTABLE CHEST 1 VIEW COMPARISON:  11/10/2015. FINDINGS: Endotracheal tube, NG tube, left IJ line in stable position. Cardiomegaly with progressive diffuse bilateral pulmonary infiltrates. Progression of small pleural effusions. No pneumothorax. IMPRESSION: 1. Lines and tubes in stable position. 2. Cardiomegaly with diffuse progression of bilateral pulmonary infiltrates consistent with diffuse bilateral pulmonary edema. Bilateral small pleural effusions are noted. Electronically Signed   By: Marcello Moores  Register   On: 11/11/2015 07:07   Dg Chest Port 1 View  11/10/2015  CLINICAL DATA:  70 year old male with respiratory failure, intubated. Cerebral infarcts. Initial encounter. EXAM: PORTABLE CHEST 1 VIEW COMPARISON:  11/09/2015 and earlier. FINDINGS: Portable AP semi upright view at 0449 hours. Stable endotracheal tube tip between the level the clavicles and carina. Enteric tube courses to the left upper quadrant, tip not included. Stable left IJ central line. Pacer/resuscitation pads have been removed. Right mid and lower lung veiling opacity has regressed since 12/02/2015. There is persistent dense left lung base opacity which obscures the left hemidiaphragm and has mildly progressed since 11/08/2015. No pneumothorax. Pulmonary vascularity is within normal limits. IMPRESSION: 1.  Stable lines and tubes. 2. Left lower lobe collapse or consolidation appears progressed since 11/08/2015. 3. Improved right lung base ventilation since 12/04/2015. Electronically Signed  By: Genevie Ann M.D.   On: 11/10/2015 07:11   Dg Chest Port 1 View  11/09/2015  CLINICAL DATA:  Pneumonia, history hypertension, prostate cancer, cardiomyopathy, smoker EXAM: PORTABLE CHEST 1 VIEW COMPARISON:  Portable exam 0424 hours compared to 11/08/2015 FINDINGS: Tip of endotracheal tube projects 3.8 cm above carina. Nasogastric tube extends  into abdomen. External pacing leads in EKG leads noted. LEFT jugular central venous catheter tip projects over SVC. Minimal enlargement of cardiac silhouette with slight pulmonary vascular congestion. Mediastinal contour stable. Perihilar to basilar infiltrates bilaterally favoring pulmonary edema. No definite pleural effusion or pneumothorax. IMPRESSION: Suspected pulmonary edema, little changed. Electronically Signed   By: Lavonia Dana M.D.   On: 11/09/2015 07:05   Dg Chest Port 1 View  11/08/2015  CLINICAL DATA:  Pneumonia EXAM: PORTABLE CHEST 1 VIEW COMPARISON:  November 07, 2015 FINDINGS: The ET and NG tubes are again identified. A left central line is stable. A transcutaneous pacemaker pad is identified. An intra-aortic balloon pump is again identified with the distal tip 3.7 cm below the top of the aortic arch. This has probably been pulled back slightly in the interval. Stable cardiomegaly. No pulmonary nodules or masses. The focal opacity in the medial right lung base is improved but not completely resolve. Mild edema is suspected as well. IMPRESSION: The support apparatus is in good position. Improving focal opacity in the right infrahilar region. Mild edema. Electronically Signed   By: Dorise Bullion III M.D   On: 11/08/2015 08:03   Dg Chest Port 1 View  11/07/2015  CLINICAL DATA:  Respiratory failure. EXAM: PORTABLE CHEST 1 VIEW COMPARISON:  11/22/2015. FINDINGS: Endotracheal tube, NG tube, left IJ line stable position. Balloon pump in stable position. Cardiomegaly with normal pulmonary vascularity. Right lower lobe infiltrate consistent pneumonia. No pleural effusion or pneumothorax . IMPRESSION: 1. Lines and tubes stable position. Balloon pump in stable position. 2. Persistent right lower lobe infiltrate consistent pneumonia. Similar findings noted on prior exam. 3. Cardiomegaly.  No pulmonary venous congestion . Electronically Signed   By: Marcello Moores  Register   On: 11/07/2015 07:14   Dg Chest Port 1  View  11/05/2015  CLINICAL DATA:  Evaluate intra-aortic balloon pump placement. EXAM: PORTABLE CHEST 1 VIEW COMPARISON:  11/05/2015 at 8:08 a.m. FINDINGS: The metallic portion of the intra-aortic balloon pump is superimposed upon the inferior margin of the aortic knob, well positioned. Endotracheal tube, left internal jugular central venous line and oral/ nasogastric tube are stable. There is improved right lung aeration when compared the prior study. There is significantly less airspace opacification. Residual airspace opacification is noted at the right base. IMPRESSION: 1. Metal tip of the intra-aortic balloon pump projects along the inferior margin of the aortic knob. 2. Significant improvement in right lung aeration since the earlier exam. No new lung abnormalities. Electronically Signed   By: Lajean Manes M.D.   On: 12/03/2015 19:16   Dg Chest Port 1 View  11/29/2015  CLINICAL DATA:  Central line placement EXAM: PORTABLE CHEST 1 VIEW COMPARISON:  Chest x-ray from earlier same day and from 11/26/2015. FINDINGS: Heart size is stable. Overall cardiomediastinal silhouette is stable in size and configuration. Endotracheal tube remains well positioned with tip approximately 3 cm above the level of the carina. Nasogastric tube passes below the diaphragm. New left IJ central line in place with tip adequately positioned over the mid SVC. No pneumothorax seen. Diffuse airspace opacities are again noted throughout the right lung. Again noted is a probable  small right pleural effusion, unchanged. No new lung findings. IMPRESSION: 1. New left IJ central line appears adequately positioned with tip projected over the mid SVC. No pneumothorax. 2. Otherwise stable chest x-ray. Diffuse airspace opacities throughout the right lung, most prominent at the right lung base, unchanged in the short-term interval, most suggestive of pneumonia versus asymmetric pulmonary edema. Probable small right pleural effusion. Left lung remains  clear. Electronically Signed   By: Franki Cabot M.D.   On: 12/04/2015 08:21   Dg Chest Port 1 View  11/22/2015  CLINICAL DATA:  Respiratory failure. EXAM: PORTABLE CHEST 1 VIEW COMPARISON:  11/29/2015. FINDINGS: Endotracheal tube, NG tube in stable position. Heart size stable. Stable cardiomegaly. Diffuse right lung infiltrate, all slight interim improvement. Mild atelectasis and/or infiltrate left lung base. Small right pleural effusion cannot be excluded. No pneumothorax . Previously identified tiny metallic density noted along the right lateral chest well no longer identified. IMPRESSION: 1. Lines and tubes in stable position. 2. Diffuse right lung infiltrate, slight interim improvement. Mild left lower lobe atelectasis and/or infiltrate. Small right pleural effusion cannot be excluded. Electronically Signed   By: Marcello Moores  Register   On: 11/15/2015 07:16   Dg Chest Portable 1 View  11/23/2015  CLINICAL DATA:  Intubation. Respiratory distress. History of hypertension, prostate cancer. EXAM: PORTABLE CHEST 1 VIEW COMPARISON:  Chest radiograph September 08, 2011 FINDINGS: Endotracheal tube tip projects 2.3 cm above the carina. Nasogastric tube past the proximal stomach, distal tip not imaged. Multiple pacer pads and wires overlie the chest. The cardiac silhouette appears moderately enlarged, mediastinal silhouette is nonsuspicious. Diffuse interstitial prominence, RIGHT greater than LEFT with pulmonary vascular congestion. No pleural effusions though LEFT costophrenic angle is not imaged. No pneumothorax. Small metallic linear density projecting at RIGHT chest wall may be the external to the patient or, represent needle fragment. Recommend direct inspection. IMPRESSION: Endotracheal tube tip projects 2.3 cm above the carina. Nasogastric tube past proximal stomach. Increasing cardiomegaly and findings of asymmetric interstitial pulmonary edema. Electronically Signed   By: Elon Alas M.D.   On: 11/25/2015  22:59   Dg Abd Portable 1v  11/10/2015  CLINICAL DATA:  70 year old male status post intubation and enteric tube placement. Respiratory distress. EXAM: PORTABLE ABDOMEN - 1 VIEW COMPARISON:  None. FINDINGS: An endotracheal tube is partially visualized extending into the left hemi abdomen with tip positioned in the left lower abdomen overlying the iliac crest. Normal caliber air-filled loops of small bowel noted. There is cardiomegaly. There is diffuse hazy density of the right lung base. A 2 cm linear radiopaque density over the right lung base may be superimposed on the patient. Clinical correlation is recommended. IMPRESSION: Enteric tube the tip in the left lower abdomen superimposed over the iliac crest. Electronically Signed   By: Anner Crete M.D.   On: 11/17/2015 23:02   Ct Portable Head W/o Cm  11/07/2015  CLINICAL DATA:  Altered mental status EXAM: CT HEAD WITHOUT CONTRAST TECHNIQUE: Contiguous paraaxial images were obtained from the base of the skull through the vertex without intravenous contrast. COMPARISON:  None. FINDINGS: There is mild diffuse atrophy. There is no intracranial mass, hemorrhage, extra-axial fluid collection, or midline shift. There are lacunar type infarcts in the centra semiovale bilaterally. There is patchy small vessel disease in the centra semiovale bilaterally. There is no demonstrable acute infarct. The bony calvarium appears intact. The mastoid air cells are clear. No intraorbital lesions are evident. IMPRESSION: Atrophy with prior small basal ganglia infarcts bilaterally as  well as patchy periventricular small vessel disease. No acute infarct evident. No hemorrhage or mass effect. Electronically Signed   By: Lowella Grip III M.D.   On: 11/07/2015 13:40    PHYSICAL EXAM CVP 9-10 General: Intubated, sedated.  Neck:  Left TLC, no thyromegaly or thyroid nodule. ETT with blood noted. Lungs: Coarse BS bilaterally. CV: Lateral PMI.  Heart regular S1/S2, no S3/S4.   No peripheral edema.   Abdomen: Soft, NT, ND, no HSM. No bruits or masses. +BS   Neurologic: Not following commands, gaze to upper left.   Extremities: No clubbing or cyanosis. R and LLE SCDs.  GU: Foley yellow urine.   TELEMETRY: Sinus 70s  ASSESSMENT AND PLAN: 70 yo with history of prior prostate cancer treated with radiation, known CAD, and active smoking was found by friends on 3/2 to be in respiratory distress. To ER, intubated, NSTEMI with troponin >65. He had CPR for PEA x 2, IABP placed.  Suspect co-existing severe PNA and NSTEMI.  1. ID: PNA on CXR, PCT > 175, Strep pneumo urine antigen positive.  Probably has PNA co-existing with MI and mixed septic/cardiogenic shock.   - Broad spectrum abx per CCM.  2. Cardiogenic (+/- septic) shock: Echo with EF 15-20%.  Off pressors, IABP out.  CVP 9, co-ox 60%.  -  Volume status improved. Will hold off on further lasix for now.  Creatinine trending down. - Continue spironolactone 12.5 daily  - Continue hydralazine 37.5 mg tid and isordil 20 tid. 3. CAD: Suspect NSTEMI with TnI now > 65 and low EF.  No cath/intervention yet.  - Will need angiography if/when more stable.  - Continue ASA 81, statin.   4. AKI: Suspect triggered by shock.  Creatinine trending down.  5. Neuro: Now s/p PEA arrest with CPR x 2.  Not following commands.  Head MRI with multiple CVAs.  At this point, think prognosis poor with anoxic encephalopathy.  6. Thrombocytopenia: Plts rising, HIT negative.      7. Elevated LFTs: Shock liver. LFTs now coming down. 8. Anemia: Received 1 unit PRBCs 3/8.  Hemoglobin higher today.  Still with some blood from ETT, ?suction trauma.   Hemodynamically stable but remains with multi-system organ failure and possible anoxic brain injury. Prognosis quite tenuous.   Shirley Friar, PA-C 11/14/2015 10:03 AM  Advanced Heart Failure Team Pager [phone] (M-F; 7a - 4p)  Please contact [facility] for night-coverage after hours  (4p -7a ) and weekends on [email or web address]  Patient seen with PA, agree with the above note.  No change clinically.  Minimally responsive, anoxic encephalopathy.  Await family decision-making.    No change to cardiac meds today.    Cardiology will see again on Monday unless called.   Loralie Champagne 11/14/2015 11:30 AM

## 2015-11-14 NOTE — Progress Notes (Signed)
PULMONARY / CRITICAL CARE MEDICINE   Name: William Mccullough MRN: [id number] DOB: 1945-10-01    ADMISSION DATE:  12/03/2015 CONSULTATION DATE:  11/23/2015  REFERRING MD:  EDP  CHIEF COMPLAINT:  SOB  HISTORY OF PRESENT ILLNESS:  Pt is encephelopathic; therefore, this HPI is obtained from chart review. William Mccullough is a 70 y.o. male with PMH as outlined below. Found down, MODS, cardiogenic shock.  SUBJECTIVE:  No major events. Blood for ETT decreasing.   VITAL SIGNS: BP 193/88 mmHg  Pulse 126  Temp(Src) 98.5 F (36.9 C) (Oral)  Resp 32  Ht 5\' 9"  (1.753 m)  Wt 124 lb 9 oz (56.5 kg)  BMI 18.39 kg/m2  SpO2 96%  HEMODYNAMICS: CVP:  [3 mmHg-10 mmHg] 9 mmHg  VENTILATOR SETTINGS: Vent Mode:  [-] PRVC FiO2 (%):  [50 %-100 %] 80 % Set Rate:  [12 bmp] 12 bmp Vt Set:  [460 mL] 460 mL PEEP:  [5 cmH20] 5 cmH20 Plateau Pressure:  [15 cmH20-20 cmH20] 20 cmH20  INTAKE / OUTPUT: I/O last 3 completed shifts: In: 3642 [I.V.:832; NG/GT:2660; IV Piggyback:150] Out: 2820 [Urine:2820]   PHYSICAL EXAMINATION: General: AA male, No purposeful response to commands Neuro: Moves all four extremities.  HEENT: No thyromegaly or JVD,  Cardiovascular: RRR, no MRG Lungs: Clear, no MRG Abdomen: Soft, + BS, NT/ND Musculoskeletal: No gross deformities, no edema.  Skin: No rashes.  LABS:  BMET  Recent Labs Lab 11/13/15 0401 11/13/15 0402 11/14/15 0515  NA 142 141 141  K 4.2 4.2 4.0  CL 102 101 98*  CO2 30 29 29   BUN 48* 48* 55*  CREATININE 2.25* 2.29* 2.19*  GLUCOSE 115* 118* 165*    Electrolytes  Recent Labs Lab 11/12/15 0330 11/13/15 0401 11/13/15 0402 11/14/15 0515  CALCIUM 8.1* 8.0* 8.0* 8.0*  MG 1.9 2.0  --  1.8  PHOS 3.2 3.5 3.5 3.9    CBC  Recent Labs Lab 11/12/15 0330 11/13/15 0401 11/14/15 0515  WBC 8.3 9.9 13.7*  HGB 6.7* 7.8* 8.6*  HCT 19.2* 22.4* 25.8*  PLT 87* 117* 182    Coag's  Recent Labs Lab 11/07/15 1225 11/08/15 0800 11/10/15 1645  APTT  >200*  --  34  INR 2.49* 1.39 1.15    Sepsis Markers  Recent Labs Lab 11/07/15 1225 11/07/15 1600 11/08/15 0357  LATICACIDVEN 2.7* 2.6*  --   PROCALCITON  --   --  149.51    ABG  Recent Labs Lab 11/08/15 0835 11/08/15 0845 11/08/15 2209  PHART 7.380 7.630* 7.424  PCO2ART 56.1* 25.4* 48.8*  PO2ART 43.0* 32.0* 89.3    Liver Enzymes  Recent Labs Lab 11/08/15 0357 11/09/15 0415 11/10/15 0447  11/12/15 0330 11/13/15 0402 11/14/15 0515  AST 307* 114* 48*  --   --   --   --   ALT 414* 268* 163*  --   --   --   --   ALKPHOS 65 59 63  --   --   --   --   BILITOT 1.2 0.6 0.6  --   --   --   --   ALBUMIN 2.2* 2.1* 2.0*  < > 1.8* 1.9* 2.1*  < > = values in this interval not displayed.  Cardiac Enzymes No results for input(s): TROPONINI, PROBNP in the last 168 hours.  Glucose  Recent Labs Lab 11/13/15 0408 11/13/15 0831 11/13/15 1527 11/13/15 2035 11/14/15 0015 11/14/15 0752  GLUCAP 118* 182* 151* 129* 128* 124*  Imaging Dg Chest Port 1 View  11/14/2015  CLINICAL DATA:  Acute respiratory failure. EXAM: PORTABLE CHEST - 1 VIEW COMPARISON:  One-view chest x-ray 3/9/710. FINDINGS: Reversed lordotic positioning is evident. Despite this, diffuse edema and bilateral effusions have increased. The support apparatus is stable. Bibasilar airspace disease is present. IMPRESSION: 1. Progressive interstitial edema and effusions compatible with congestive heart failure. 2. Associated bibasilar airspace disease. 3. The support apparatus is stable. Electronically Signed   By: San Morelle M.D.   On: 11/14/2015 07:16   STUDIES:  CXR 03/01 > cardiomegaly with asymmetric interstitial edema right side. Echo pa 33, EF global 15% Cath neg eeg 3/3 >>>diffuse cerebral dysfunction that is non-specific in etiology and can be seen with hypoxic/ischemic injury, toxic/metabolic encephalopathies, or medication effect from Fentanyl Ct head 3/3>>> Atrophy with prior small basal  ganglia infarcts bilaterally as well as patchy periventricular small vessel disease. No acute infarct MRI head 3/5 >> Multiple B/L acute/subacute infarcts, petechial hemorrhage.  CULTURES: Blood 03/01 >>> Urine 03/01 >>> Sputum 03/01 >>> Leg>>>neg Strep>>>POS  ANTIBIOTICS: Vanc 03/02 >>>3/5 Zosyn 03/02 >>>3/3 cefriaxone 3/3>>>  SIGNIFICANT EVENTS: 03/02 > admitted with acute hypoxic respiratory failure requiring intubation.  Had brief code post intubation. 3/2- balloon pump, renal failure 3/4 - off pressors, balloon pump out  LINES/TUBES: ETT 03/02 > Left IJ 3/2>>> Aline 3/2>>>3/4  ASSESSMENT / PLAN:  PULMONARY A: Acute hypoxic respiratory failure - required intubation in ED. PNA-CAP Blood from ETT > Likely from suction trauma in setting of low platelets P:   On min support. Daily wake up and breath assessments Poor mental status is a barrier to weaning. Monitor blood from ETT. Minimize suctioning.  CARDIOVASCULAR A:  Septic shock, septic cardiomyoapthy Brief cardiac arrest post intubation - ROSC after 1 - 2 min Troponin leak - suspect due to demand ischemia. Hx HTN, cardiomyopathy (last echo from 2013 with EF 40-45%, grade 1DD.  Of note, per EDP, bedside cardiac POCUS revealed very poor EF estimated at 10-15%). P:  Balloon pump, out Off milrinone. Lasix as needed to keep I/O even. 40 mg once today. Add spironolactone per cards  RENAL A:   AKI, ATN AGMA - lactate. POS balance, chf P:   No acute need for dialysis Monitor urine output and Cr.  Free water for increased Na Replete lytes  GASTROINTESTINAL A:   GI prophylaxis. Nutrition. Shock liver >> Improving LFTs P:   SUP: Pantoprazole. TF to goal  HEMATOLOGIC / ONCOLOGIC A:   VTE Prophylaxis. Hx prostate CA - last seen in 2013 and was doing well at that time. Thrombocytopenia *(sepsis) R/o DIC component- neg Coagulapathy improved P:  SCD's Off S.Q heparin given the low platelets. HIT ab  negative. Hb is stable.  INFECTIOUS A:   Septic shock, Pneumonia, strep pos ag Possible pneumococcal meningitis. Unable to LP now as low platelets and is low yield. Already on empiric treatment of pneumococcus P:   Maintain cefriaxone, Day 9 of abx. Will give for total 14 days   ENDOCRINE A:   Hyperglycemia - no hx DM. P:   SSI Cortisol 15. Will use stress dose steroids if hypotensive.   NEUROLOGIC A:   Acute metabolic encephalopathy. ? Anoxic injury Multiple embolic strokes.  Hx anxiety, depression. At risk meningitis? Poor baseline status P:   Sedation:   Wean fentanyl gtt, precedex Off versed RASS goal: 0  Daily WUA  meningitis.   Family updated: Niece updated with nurse at bedside 3/6, 3/8. Discussed that we are  concerned that his poor mental status as a result of anoxic injury and multiple infarct will make liberation from vent very difficult. I dont think he is a good candidate for a trach given the underlying poor functional status. Further I reccommended that he not be resuscitated if he suffers another cardiac arrest. She want to discuss with the rest of the family and get back to [place] regarding goals of care. They are trying to locate the estranged children.  Interdisciplinary Family Meeting v Palliative Care Meeting:  Due by: 03/08.  CC time: 35 minutes  Marshell Garfinkel MD [facility] Pager [phone] If no answer or after 3pm call: [phone] 11/14/2015, 12:22 PM

## 2015-11-15 LAB — RENAL FUNCTION PANEL
ALBUMIN: 1.8 g/dL — AB (ref 3.5–5.0)
Anion gap: 9 (ref 5–15)
BUN: 68 mg/dL — ABNORMAL HIGH (ref 6–20)
CALCIUM: 7.8 mg/dL — AB (ref 8.9–10.3)
CO2: 31 mmol/L (ref 22–32)
CREATININE: 2.37 mg/dL — AB (ref 0.61–1.24)
Chloride: 101 mmol/L (ref 101–111)
GFR, EST AFRICAN AMERICAN: 31 mL/min — AB (ref >60)
GFR, EST NON AFRICAN AMERICAN: 27 mL/min — AB (ref >60)
Glucose, Bld: 145 mg/dL — ABNORMAL HIGH (ref 65–99)
PHOSPHORUS: 4.3 mg/dL (ref 2.5–4.6)
Potassium: 4.4 mmol/L (ref 3.5–5.1)
SODIUM: 141 mmol/L (ref 135–145)

## 2015-11-15 LAB — CBC
HCT: 17.8 % — ABNORMAL LOW (ref 39.0–52.0)
HEMATOCRIT: 26 % — AB (ref 39.0–52.0)
HEMOGLOBIN: 8.7 g/dL — AB (ref 13.0–17.0)
Hemoglobin: 6.1 g/dL — CL (ref 13.0–17.0)
MCH: 28.1 pg (ref 26.0–34.0)
MCH: 27.5 pg (ref 26.0–34.0)
MCHC: 34.3 g/dL (ref 30.0–36.0)
MCHC: 33.5 g/dL (ref 30.0–36.0)
MCV: 82 fL (ref 78.0–100.0)
MCV: 82.3 fL (ref 78.0–100.0)
PLATELETS: 158 10*3/uL (ref 150–400)
Platelets: 167 10*3/uL (ref 150–400)
RBC: 2.17 MIL/uL — ABNORMAL LOW (ref 4.22–5.81)
RBC: 3.16 MIL/uL — ABNORMAL LOW (ref 4.22–5.81)
RDW: 14.7 % (ref 11.5–15.5)
RDW: 14.8 % (ref 11.5–15.5)
WBC: 15.2 10*3/uL — ABNORMAL HIGH (ref 4.0–10.5)
WBC: 14.9 10*3/uL — AB (ref 4.0–10.5)

## 2015-11-15 LAB — GLUCOSE, CAPILLARY
GLUCOSE-CAPILLARY: 140 mg/dL — AB (ref 65–99)
GLUCOSE-CAPILLARY: 163 mg/dL — AB (ref 65–99)
Glucose-Capillary: 144 mg/dL — ABNORMAL HIGH (ref 65–99)
Glucose-Capillary: 164 mg/dL — ABNORMAL HIGH (ref 65–99)
Glucose-Capillary: 163 mg/dL — ABNORMAL HIGH (ref 65–99)

## 2015-11-15 LAB — PREPARE RBC (CROSSMATCH)

## 2015-11-15 MED ORDER — FUROSEMIDE 10 MG/ML IJ SOLN
40.0000 mg | Freq: Once | INTRAMUSCULAR | Status: AC
  Administered 2015-11-15: 40 mg via INTRAVENOUS
  Filled 2015-11-15: qty 4

## 2015-11-15 MED ORDER — SODIUM CHLORIDE 0.9 % IV SOLN
Freq: Once | INTRAVENOUS | Status: AC
  Administered 2015-11-15: 07:00:00 via INTRAVENOUS

## 2015-11-15 NOTE — Progress Notes (Signed)
Hem. 6.1.  MD notified.  Will transfuse 2 units of blood as ordered.  Will continue to monitor.

## 2015-11-15 NOTE — Progress Notes (Signed)
PULMONARY / CRITICAL CARE MEDICINE   Name: William Mccullough MRN: [id number] DOB: Jun 27, 1946    ADMISSION DATE:  11/22/2015 CONSULTATION DATE:  11/21/2015  REFERRING MD:  EDP  CHIEF COMPLAINT:  SOB  HISTORY OF PRESENT ILLNESS:  Pt is encephelopathic; therefore, this HPI is obtained from chart review. William Mccullough is a 70 y.o. male with PMH as outlined below. Found down, MODS, cardiogenic shock.  SUBJECTIVE:  Remains unresponsive on vent  PRBC for worsening anemia   VITAL SIGNS: BP 118/65 mmHg  Pulse 63  Temp(Src) 98.7 F (37.1 C) (Oral)  Resp 12  Ht 5\' 9"  (1.753 m)  Wt 126 lb 12.2 oz (57.5 kg)  BMI 18.71 kg/m2  SpO2 98%  HEMODYNAMICS: CVP:  [4 mmHg-10 mmHg] 7 mmHg  VENTILATOR SETTINGS: Vent Mode:  [-] PRVC FiO2 (%):  [40 %-70 %] 50 % Set Rate:  [12 bmp] 12 bmp Vt Set:  [460 mL] 460 mL PEEP:  [5 cmH20] 5 cmH20 Plateau Pressure:  [12 cmH20-18 cmH20] 15 cmH20  INTAKE / OUTPUT: I/O last 3 completed shifts: In: 4189.3 [I.V.:1614.3; NG/GT:2425; IV Piggyback:150] Out: [id number] [Urine:1815]   PHYSICAL EXAMINATION: General: AA male, No purposeful response to commands, on vent  Neuro: Moves all four extremities.  HEENT: No thyromegaly or JVD, ETT  Cardiovascular: RRR, no MRG Lungs: Clear, no MRG Abdomen: Soft, + BS, NT/ND Musculoskeletal: No gross deformities, no edema.  Skin: No rashes.  LABS:  BMET  Recent Labs Lab 11/13/15 0402 11/14/15 0515 11/15/15 0540  NA 141 141 141  K 4.2 4.0 4.4  CL 101 98* 101  CO2 29 29 31   BUN 48* 55* 68*  CREATININE 2.29* 2.19* 2.37*  GLUCOSE 118* 165* 145*    Electrolytes  Recent Labs Lab 11/12/15 0330 11/13/15 0401 11/13/15 0402 11/14/15 0515 11/15/15 0540  CALCIUM 8.1* 8.0* 8.0* 8.0* 7.8*  MG 1.9 2.0  --  1.8  --   PHOS 3.2 3.5 3.5 3.9 4.3    CBC  Recent Labs Lab 11/13/15 0401 11/14/15 0515 11/15/15 0540  WBC 9.9 13.7* 15.2*  HGB 7.8* 8.6* 6.1*  HCT 22.4* 25.8* 17.8*  PLT 117* 182 167     Coag's  Recent Labs Lab 11/10/15 1645  APTT 34  INR 1.15    Sepsis Markers No results for input(s): LATICACIDVEN, PROCALCITON, O2SATVEN in the last 168 hours.  ABG  Recent Labs Lab 11/08/15 2209  PHART 7.424  PCO2ART 48.8*  PO2ART 89.3    Liver Enzymes  Recent Labs Lab 11/09/15 0415 11/10/15 0447  11/13/15 0402 11/14/15 0515 11/15/15 0540  AST 114* 48*  --   --   --   --   ALT 268* 163*  --   --   --   --   ALKPHOS 59 63  --   --   --   --   BILITOT 0.6 0.6  --   --   --   --   ALBUMIN 2.1* 2.0*  < > 1.9* 2.1* 1.8*  < > = values in this interval not displayed.  Cardiac Enzymes No results for input(s): TROPONINI, PROBNP in the last 168 hours.  Glucose  Recent Labs Lab 11/14/15 1648 11/14/15 1918 11/14/15 2333 11/15/15 0450 11/15/15 0452 11/15/15 0722  GLUCAP 143* 165* 126* >600* 144* 164*    Imaging No results found. STUDIES:  CXR 03/01 > cardiomegaly with asymmetric interstitial edema right side. Echo pa 33, EF global 15% Cath neg eeg 3/3 >>>diffuse cerebral dysfunction that  is non-specific in etiology and can be seen with hypoxic/ischemic injury, toxic/metabolic encephalopathies, or medication effect from Fentanyl Ct head 3/3>>> Atrophy with prior small basal ganglia infarcts bilaterally as well as patchy periventricular small vessel disease. No acute infarct MRI head 3/5 >> Multiple B/L acute/subacute infarcts, petechial hemorrhage.  CULTURES: Blood 03/01 >>>NEG  Urine 03/01 >>>NEG  Sputum 03/01 >>>NEG  Leg>>>neg Strep>>>POS  ANTIBIOTICS: Vanc 03/02 >>>3/5 Zosyn 03/02 >>>3/3 cefriaxone 3/3>>>  SIGNIFICANT EVENTS: 03/02 > admitted with acute hypoxic respiratory failure requiring intubation.  Had brief code post intubation. 3/2- balloon pump, renal failure 3/4 - off pressors, balloon pump out Cardiology signed off 3/10   LINES/TUBES: ETT 03/02 > Left IJ 3/2>>> Aline 3/2>>>3/4  ASSESSMENT / PLAN:  PULMONARY A: Acute  hypoxic respiratory failure - required intubation in ED. PNA-CAP Blood from ETT > Likely from suction trauma in setting of low platelets P:  Vent support  Daily wake up and breath assessments Poor mental status is a barrier to weaning. Monitor blood from ETT. Minimize suctioning.  CARDIOVASCULAR A:  Septic shock, septic cardiomyoapthy Brief cardiac arrest post intubation - ROSC after 1 - 2 min Troponin leak - suspect due to demand ischemia. Hx HTN, cardiomyopathy (last echo from 2013 with EF 40-45%, grade 1DD.  Of note, per EDP, bedside cardiac POCUS revealed very poor EF estimated at 10-15%). >balloon pump out 3/3 P:  Lasix 40mg  x 1  Cont spironolactone per cards  RENAL A:   AKI, ATN AGMA - lactate. POS balance, chf P:   No acute need for dialysis Monitor urine output and Cr.  Free water for increased Na Replete lytes  GASTROINTESTINAL A:   GI prophylaxis. Nutrition. Shock liver >> Improving LFTs P:   SUP: Pantoprazole. TF to goal  HEMATOLOGIC / ONCOLOGIC A:   VTE Prophylaxis. Hx prostate CA - last seen in 2013 and was doing well at that time. Thrombocytopenia *(sepsis) R/o DIC component- neg Coagulapathy improved Anemia s/p 2 U PRBC 3/11  P:  SCD's Off SQ heparin given the low platelets. HIT ab negative. S/p 2 u PRBC 3/11    INFECTIOUS A:   Septic shock, Pneumonia, strep pos ag Possible pneumococcal meningitis. Unable to LP now as low platelets and is low yield. Already on empiric treatment of pneumococcus P:   Maintain cefriaxone, Day 9 of abx. Will give for total 14 days   ENDOCRINE A:   Hyperglycemia - no hx DM. P:   SSI Cortisol 15. Will use stress dose steroids if hypotensive.   NEUROLOGIC A:   Acute metabolic encephalopathy. ? Anoxic injury Multiple embolic strokes.  Hx anxiety, depression. At risk meningitis? Poor baseline status P:   Sedation:   Wean fentanyl gtt, precedex Off versed RASS goal: 0  Daily WUA    Family  updated: Niece updated with nurse at bedside 3/6, 3/8. Discussed that we are concerned that his poor mental status as a result of anoxic injury and multiple infarct will make liberation from vent very difficult. I dont think he is a good candidate for a trach given the underlying poor functional status. Further I reccommended that he not be resuscitated if he suffers another cardiac arrest. She want to discuss with the rest of the family and get back to [place] regarding goals of care. They are trying to locate the estranged children.   Interdisciplinary Family Meeting v Palliative Care Meeting:  Due by: 03/08 No family present 3/11   Cruise Baumgardner NP-C  [facility]  [phone]    11/15/2015, 11:34 AM

## 2015-11-15 NOTE — Progress Notes (Signed)
[facility] Progress Note Patient Name: William Mccullough DOB: 26-Mar-1946 MRN: [id number]   Date of Service  11/15/2015  HPI/Events of Note  Hgb drop from 8.6 to 6.1  eICU Interventions  Transfuse 2 units pRBC Post-transfusion CBC     Intervention Category Intermediate Interventions: Other:  Sumaya Riedesel 11/15/2015, 6:09 AM

## 2015-11-15 NOTE — Progress Notes (Signed)
Patient ID: William Mccullough, male   DOB: [birth year], 70 y.o.   MRN: [id number]   SUBJECTIVE:  3/2: Respiratory arrest with intubation followed by PEA arrest x 2.  IABP placed.  Pressors begun.  CXR with RLL PNA. PCT > 175. Strep pneumo urine antigen+, flu -.  Troponin > 65   3/3: IABP out.   Echo: EF 15-20% with decreased RV function.   Not responsive. Getting blood again for hgb 6.1. Unable to wean from vent. Belly distended and quiet. Family meeting today.      Scheduled Meds: . antiseptic oral rinse  7 mL Mouth Rinse 10 times per day  . aspirin  81 mg Per Tube Daily  . atorvastatin  40 mg Per Tube q1800  . cefTRIAXone (ROCEPHIN)  IV  2 g Intravenous Q12H  . chlorhexidine gluconate  15 mL Mouth Rinse BID  . free water  200 mL Per Tube 3 times per day  . hydrALAZINE  37.5 mg Per Tube 3 times per day  . insulin aspart  0-15 Units Subcutaneous 6 times per day  . isosorbide dinitrate  20 mg Per Tube TID  . pantoprazole sodium  40 mg Per Tube QHS  . sodium chloride flush  10-40 mL Intracatheter Q12H  . sodium chloride flush  3 mL Intravenous Q12H  . spironolactone  12.5 mg Per Tube Daily   Continuous Infusions: . sodium chloride Stopped (11/09/15 0800)  . sodium chloride 10 mL/hr at 11/15/15 0800  . dexmedetomidine 1 mcg/kg/hr (11/15/15 0800)  . feeding supplement (VITAL AF 1.2 CAL) 1,000 mL (11/15/15 0621)  . fentaNYL infusion INTRAVENOUS 200 mcg/hr (11/15/15 0800)   PRN Meds:.sodium chloride, Place/Maintain arterial line **AND** sodium chloride, albuterol, fentaNYL, hydrALAZINE, Influenza vac split quadrivalent PF, pneumococcal 23 valent vaccine, sodium chloride flush   Filed Vitals:   11/15/15 0724 11/15/15 0726 11/15/15 0800 11/15/15 0900  BP: 105/53 102/50 109/59 112/57  Pulse: 63 66 64 60  Temp: 98.9 F (37.2 C)     TempSrc: Oral     Resp: 12 12 13 13   Height:      Weight:      SpO2: 98% 99% 99% 97%    Intake/Output Summary (Last 24 hours) at 11/15/15 1001 Last  data filed at 11/15/15 0800  Gross per 24 hour  Intake 2544.16 ml  Output    980 ml  Net 1564.16 ml    LABS: Basic Metabolic Panel:  Recent Labs  11/13/15 0401  11/14/15 0515 11/15/15 0540  NA 142  < > 141 141  K 4.2  < > 4.0 4.4  CL 102  < > 98* 101  CO2 30  < > 29 31  GLUCOSE 115*  < > 165* 145*  BUN 48*  < > 55* 68*  CREATININE 2.25*  < > 2.19* 2.37*  CALCIUM 8.0*  < > 8.0* 7.8*  MG 2.0  --  1.8  --   PHOS 3.5  < > 3.9 4.3  < > = values in this interval not displayed. Liver Function Tests:  Recent Labs  11/14/15 0515 11/15/15 0540  ALBUMIN 2.1* 1.8*   No results for input(s): LIPASE, AMYLASE in the last 72 hours. CBC:  Recent Labs  11/14/15 0515 11/15/15 0540  WBC 13.7* 15.2*  HGB 8.6* 6.1*  HCT 25.8* 17.8*  MCV 81.9 82.0  PLT 182 167   Cardiac Enzymes: No results for input(s): CKTOTAL, CKMB, CKMBINDEX, TROPONINI in the last 72 hours. BNP: Invalid input(s): POCBNP  D-Dimer: No results for input(s): DDIMER in the last 72 hours. Hemoglobin A1C: No results for input(s): HGBA1C in the last 72 hours. Fasting Lipid Panel: No results for input(s): CHOL, HDL, LDLCALC, TRIG, CHOLHDL, LDLDIRECT in the last 72 hours. Thyroid Function Tests: No results for input(s): TSH, T4TOTAL, T3FREE, THYROIDAB in the last 72 hours.  Invalid input(s): FREET3 Anemia Panel: No results for input(s): VITAMINB12, FOLATE, FERRITIN, TIBC, IRON, RETICCTPCT in the last 72 hours.  RADIOLOGY: Mr Herby Abraham Contrast  11/10/2015  CLINICAL DATA:  Initial evaluation for acute stroke. EXAM: MRI HEAD WITHOUT CONTRAST TECHNIQUE: Multiplanar, multiecho pulse sequences of the brain and surrounding structures were obtained without intravenous contrast. COMPARISON:  Prior CT from 11/07/2015. FINDINGS: Study mildly degraded by motion artifact. Diffuse prominence of the CSF containing spaces compatible with generalized cerebral atrophy. Patchy and confluent T2/FLAIR hyperintensity within the  periventricular and deep white matter both cerebral hemispheres most consistent with chronic small vessel ischemic disease, moderate nature. Multiple superimposed remote lacunar infarcts involve the bilateral basal ganglia and thalami. Probable small remote lacunar infarcts within the pons as well. Confluent area of restricted diffusion within the high left frontal parietal region measures 11 x 17 x 13 mm. This demonstrates central decreased ADC signal, consistent with acute/ early subacute ischemic infarct. Probable subtle associated petechial hemorrhage without hemorrhagic transformation (series 8, image 19). Few smaller additional punctate cortical infarcts present within the left frontal parietal region. Small punctate cortical infarct more posteriorly within the left parietal lobe (Series 4, image 32). Scattered largely cortical ischemic infarcts within the bilateral occipital regions (Series 4, image 21, 20 in). There are patchy multi focal ischemic infarcts involving the bilateral cerebellar hemispheres, left worse than right. Punctate infarcts extend along the midline in the cerebellar vermis as well. Corresponding ADC signal about these infarcts appears to largely be resolving at this time, suggesting at these are early subacute in nature. Probable faint associated petechial hemorrhage within the left cerebellar hemisphere without hemorrhagic transformation (series 8, image 5). There is associated T2/FLAIR signal intensity about these infarcts with minimal localized edema without significant mass effect. Major intracranial vascular flow voids are maintained. No mass lesion, midline shift, or mass effect. Ventricular prominence related to global parenchymal volume loss present without hydrocephalus. No extra-axial fluid collection. Major dural sinuses are patent. Craniocervical junction within normal limits. Visualized upper cervical spine unremarkable. Pituitary gland normal.  No acute abnormality about the  orbits. Scattered mucosal thickening within the ethmoidal air cells and sphenoid sinuses. Fluid within the nasopharynx. Patient likely is intubated. Scattered opacity within the bilateral mastoid air cells. Inner ear structures grossly normal. Bone marrow signal intensity within normal limits. No scalp soft tissue abnormality. IMPRESSION: 1. Scattered acute/early subacute ischemic infarcts involving the left frontoparietal region, the bilateral occipital lobes, and bilateral cerebellar hemispheres as above. No significant mass effect. Probable faint petechial hemorrhage with the dominant left frontoparietal and left cerebellar infarcts as above. No evidence for frank hemorrhagic transformation. 2. Advanced cerebral atrophy with moderate chronic small vessel ischemic disease and multiple remote lacunar infarcts involving the bilateral basal ganglia, thalami, and pons. Electronically Signed   By: Jeannine Boga M.D.   On: 11/10/2015 02:57   US Renal  11/17/2015  CLINICAL DATA:  Acute renal injury EXAM: RENAL / URINARY TRACT ULTRASOUND COMPLETE COMPARISON:  None. FINDINGS: Right Kidney: Length: 10.8 cm. Echogenicity within normal limits. No mass or hydronephrosis visualized. Left Kidney: Length: 10.3 cm. No hydronephrosis is noted. Calcifications are identified within the renal pelvis  consistent with renal calculi. The largest of these measures 9 mm. Bladder: Decompressed by Foley catheter. IMPRESSION: Left renal calculi without acute abnormality. Electronically Signed   By: Inez Catalina M.D.   On: 11/11/2015 18:54   Dg Chest Port 1 View  11/14/2015  CLINICAL DATA:  Acute respiratory failure. EXAM: PORTABLE CHEST - 1 VIEW COMPARISON:  One-view chest x-ray 3/9/710. FINDINGS: Reversed lordotic positioning is evident. Despite this, diffuse edema and bilateral effusions have increased. The support apparatus is stable. Bibasilar airspace disease is present. IMPRESSION: 1. Progressive interstitial edema and  effusions compatible with congestive heart failure. 2. Associated bibasilar airspace disease. 3. The support apparatus is stable. Electronically Signed   By: San Morelle M.D.   On: 11/14/2015 07:16   Dg Chest Port 1 View  11/13/2015  CLINICAL DATA:  70 year old male with respiratory failure and shortness of breath. Cerebral infarcts. Acute renal insufficiency. Initial encounter. EXAM: PORTABLE CHEST 1 VIEW COMPARISON:  11/11/2015 and earlier. FINDINGS: Portable AP semi upright view at 0433 hours. The patient is mildly rotated to the left. Stable left IJ central line. Enteric tube course to the abdomen, tip not included. Endotracheal tube tip projects over the enteric tube it appears to be stable up the level the clavicles. Since yesterday worsening veiling opacity at both lung bases and obscuration of the diaphragm. Increased basilar predominant interstitial opacity in both lungs. No pneumothorax. Mediastinal contours appear grossly stable. IMPRESSION: 1.  Stable lines and tubes. 2. Worsening bibasilar ventilation since yesterday which appears related to a combination of pleural effusion and collapse/consolidation. 3. Worsening interstitial opacity with basilar predominance which could reflect worsening edema or bilateral infection. Electronically Signed   By: Genevie Ann M.D.   On: 11/13/2015 07:17   Dg Chest Port 1 View  11/12/2015  CLINICAL DATA:  Respiratory failure. EXAM: PORTABLE CHEST 1 VIEW COMPARISON:  11/11/2015. FINDINGS: Endotracheal tube, NG tube in stable position. Cardiomegaly. Improving bilateral pulmonary infiltrates consistent with improving pulmonary edema. Small left pleural effusion noted. No pneumothorax . IMPRESSION: 1. Lines and tubes stable position. 2. Cardiomegaly with improving bilateral pulmonary infiltrates consistent with improving pulmonary edema. Tiny residual small left pleural effusion. Electronically Signed   By: Marcello Moores  Register   On: 11/12/2015 07:00   Dg Chest Port 1  View  11/11/2015  CLINICAL DATA:  Acute respiratory failure.  Shortness breath. EXAM: PORTABLE CHEST 1 VIEW COMPARISON:  11/10/2015. FINDINGS: Endotracheal tube, NG tube, left IJ line in stable position. Cardiomegaly with progressive diffuse bilateral pulmonary infiltrates. Progression of small pleural effusions. No pneumothorax. IMPRESSION: 1. Lines and tubes in stable position. 2. Cardiomegaly with diffuse progression of bilateral pulmonary infiltrates consistent with diffuse bilateral pulmonary edema. Bilateral small pleural effusions are noted. Electronically Signed   By: Marcello Moores  Register   On: 11/11/2015 07:07   Dg Chest Port 1 View  11/10/2015  CLINICAL DATA:  70 year old male with respiratory failure, intubated. Cerebral infarcts. Initial encounter. EXAM: PORTABLE CHEST 1 VIEW COMPARISON:  11/09/2015 and earlier. FINDINGS: Portable AP semi upright view at 0449 hours. Stable endotracheal tube tip between the level the clavicles and carina. Enteric tube courses to the left upper quadrant, tip not included. Stable left IJ central line. Pacer/resuscitation pads have been removed. Right mid and lower lung veiling opacity has regressed since 11/25/2015. There is persistent dense left lung base opacity which obscures the left hemidiaphragm and has mildly progressed since 11/08/2015. No pneumothorax. Pulmonary vascularity is within normal limits. IMPRESSION: 1.  Stable lines and tubes. 2.  Left lower lobe collapse or consolidation appears progressed since 11/08/2015. 3. Improved right lung base ventilation since 11/24/2015. Electronically Signed   By: Genevie Ann M.D.   On: 11/10/2015 07:11   Dg Chest Port 1 View  11/09/2015  CLINICAL DATA:  Pneumonia, history hypertension, prostate cancer, cardiomyopathy, smoker EXAM: PORTABLE CHEST 1 VIEW COMPARISON:  Portable exam 0424 hours compared to 11/08/2015 FINDINGS: Tip of endotracheal tube projects 3.8 cm above carina. Nasogastric tube extends into abdomen. External pacing  leads in EKG leads noted. LEFT jugular central venous catheter tip projects over SVC. Minimal enlargement of cardiac silhouette with slight pulmonary vascular congestion. Mediastinal contour stable. Perihilar to basilar infiltrates bilaterally favoring pulmonary edema. No definite pleural effusion or pneumothorax. IMPRESSION: Suspected pulmonary edema, little changed. Electronically Signed   By: Lavonia Dana M.D.   On: 11/09/2015 07:05   Dg Chest Port 1 View  11/08/2015  CLINICAL DATA:  Pneumonia EXAM: PORTABLE CHEST 1 VIEW COMPARISON:  November 07, 2015 FINDINGS: The ET and NG tubes are again identified. A left central line is stable. A transcutaneous pacemaker pad is identified. An intra-aortic balloon pump is again identified with the distal tip 3.7 cm below the top of the aortic arch. This has probably been pulled back slightly in the interval. Stable cardiomegaly. No pulmonary nodules or masses. The focal opacity in the medial right lung base is improved but not completely resolve. Mild edema is suspected as well. IMPRESSION: The support apparatus is in good position. Improving focal opacity in the right infrahilar region. Mild edema. Electronically Signed   By: Dorise Bullion III M.D   On: 11/08/2015 08:03   Dg Chest Port 1 View  11/07/2015  CLINICAL DATA:  Respiratory failure. EXAM: PORTABLE CHEST 1 VIEW COMPARISON:  11/25/2015. FINDINGS: Endotracheal tube, NG tube, left IJ line stable position. Balloon pump in stable position. Cardiomegaly with normal pulmonary vascularity. Right lower lobe infiltrate consistent pneumonia. No pleural effusion or pneumothorax . IMPRESSION: 1. Lines and tubes stable position. Balloon pump in stable position. 2. Persistent right lower lobe infiltrate consistent pneumonia. Similar findings noted on prior exam. 3. Cardiomegaly.  No pulmonary venous congestion . Electronically Signed   By: Marcello Moores  Register   On: 11/07/2015 07:14   Dg Chest Port 1 View  11/26/2015  CLINICAL DATA:   Evaluate intra-aortic balloon pump placement. EXAM: PORTABLE CHEST 1 VIEW COMPARISON:  11/09/2015 at 8:08 a.m. FINDINGS: The metallic portion of the intra-aortic balloon pump is superimposed upon the inferior margin of the aortic knob, well positioned. Endotracheal tube, left internal jugular central venous line and oral/ nasogastric tube are stable. There is improved right lung aeration when compared the prior study. There is significantly less airspace opacification. Residual airspace opacification is noted at the right base. IMPRESSION: 1. Metal tip of the intra-aortic balloon pump projects along the inferior margin of the aortic knob. 2. Significant improvement in right lung aeration since the earlier exam. No new lung abnormalities. Electronically Signed   By: Lajean Manes M.D.   On: 11/27/2015 19:16   Dg Chest Port 1 View  11/16/2015  CLINICAL DATA:  Central line placement EXAM: PORTABLE CHEST 1 VIEW COMPARISON:  Chest x-ray from earlier same day and from 11/10/2015. FINDINGS: Heart size is stable. Overall cardiomediastinal silhouette is stable in size and configuration. Endotracheal tube remains well positioned with tip approximately 3 cm above the level of the carina. Nasogastric tube passes below the diaphragm. New left IJ central line in place with tip adequately positioned  over the mid SVC. No pneumothorax seen. Diffuse airspace opacities are again noted throughout the right lung. Again noted is a probable small right pleural effusion, unchanged. No new lung findings. IMPRESSION: 1. New left IJ central line appears adequately positioned with tip projected over the mid SVC. No pneumothorax. 2. Otherwise stable chest x-ray. Diffuse airspace opacities throughout the right lung, most prominent at the right lung base, unchanged in the short-term interval, most suggestive of pneumonia versus asymmetric pulmonary edema. Probable small right pleural effusion. Left lung remains clear. Electronically Signed    By: Franki Cabot M.D.   On: 11/21/2015 08:21   Dg Chest Port 1 View  11/17/2015  CLINICAL DATA:  Respiratory failure. EXAM: PORTABLE CHEST 1 VIEW COMPARISON:  11/07/2015. FINDINGS: Endotracheal tube, NG tube in stable position. Heart size stable. Stable cardiomegaly. Diffuse right lung infiltrate, all slight interim improvement. Mild atelectasis and/or infiltrate left lung base. Small right pleural effusion cannot be excluded. No pneumothorax . Previously identified tiny metallic density noted along the right lateral chest well no longer identified. IMPRESSION: 1. Lines and tubes in stable position. 2. Diffuse right lung infiltrate, slight interim improvement. Mild left lower lobe atelectasis and/or infiltrate. Small right pleural effusion cannot be excluded. Electronically Signed   By: Marcello Moores  Register   On: 11/25/2015 07:16   Dg Chest Portable 1 View  11/14/2015  CLINICAL DATA:  Intubation. Respiratory distress. History of hypertension, prostate cancer. EXAM: PORTABLE CHEST 1 VIEW COMPARISON:  Chest radiograph September 08, 2011 FINDINGS: Endotracheal tube tip projects 2.3 cm above the carina. Nasogastric tube past the proximal stomach, distal tip not imaged. Multiple pacer pads and wires overlie the chest. The cardiac silhouette appears moderately enlarged, mediastinal silhouette is nonsuspicious. Diffuse interstitial prominence, RIGHT greater than LEFT with pulmonary vascular congestion. No pleural effusions though LEFT costophrenic angle is not imaged. No pneumothorax. Small metallic linear density projecting at RIGHT chest wall may be the external to the patient or, represent needle fragment. Recommend direct inspection. IMPRESSION: Endotracheal tube tip projects 2.3 cm above the carina. Nasogastric tube past proximal stomach. Increasing cardiomegaly and findings of asymmetric interstitial pulmonary edema. Electronically Signed   By: Elon Alas M.D.   On: 12/03/2015 22:59   Dg Abd Portable  1v  11/08/2015  CLINICAL DATA:  70 year old male status post intubation and enteric tube placement. Respiratory distress. EXAM: PORTABLE ABDOMEN - 1 VIEW COMPARISON:  None. FINDINGS: An endotracheal tube is partially visualized extending into the left hemi abdomen with tip positioned in the left lower abdomen overlying the iliac crest. Normal caliber air-filled loops of small bowel noted. There is cardiomegaly. There is diffuse hazy density of the right lung base. A 2 cm linear radiopaque density over the right lung base may be superimposed on the patient. Clinical correlation is recommended. IMPRESSION: Enteric tube the tip in the left lower abdomen superimposed over the iliac crest. Electronically Signed   By: Anner Crete M.D.   On: 11/12/2015 23:02   Ct Portable Head W/o Cm  11/07/2015  CLINICAL DATA:  Altered mental status EXAM: CT HEAD WITHOUT CONTRAST TECHNIQUE: Contiguous paraaxial images were obtained from the base of the skull through the vertex without intravenous contrast. COMPARISON:  None. FINDINGS: There is mild diffuse atrophy. There is no intracranial mass, hemorrhage, extra-axial fluid collection, or midline shift. There are lacunar type infarcts in the centra semiovale bilaterally. There is patchy small vessel disease in the centra semiovale bilaterally. There is no demonstrable acute infarct. The bony calvarium appears  intact. The mastoid air cells are clear. No intraorbital lesions are evident. 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 No acute infarct evident. No hemorrhage or mass effect. Electronically Signed   By: Lowella Grip III M.D.   On: 11/07/2015 13:40    PHYSICAL EXAM  General: Intubated, sedated.  Neck:  Left TLC, no thyromegaly or thyroid nodule. ETT with blood noted. Lungs: Coarse BS bilaterally. CV: Lateral PMI.  Heart regular S1/S2, no S3/S4.  No peripheral edema.   Abdomen:  Soft, NT, distended. Quiet, no HSM. No bruits or masses. +BS   Neurologic: Not following commands, gaze to upper left.   Extremities: No clubbing or cyanosis. R and LLE SCDs.  GU: Foley yellow urine.   TELEMETRY: Sinus 70s  ASSESSMENT AND PLAN: 70 yo with history of prior prostate cancer treated with radiation, known CAD, and active smoking was found by friends on 3/2 to be in respiratory distress. To ER, intubated, NSTEMI with troponin >65. He had CPR for PEA x 2, IABP placed.  Suspect co-existing severe PNA and NSTEMI.  1. ID: PNA on CXR, PCT > 175, Strep pneumo urine antigen positive.  Probably has PNA co-existing with MI and mixed septic/cardiogenic shock.  Also treating for possible meningitis - Broad spectrum abx per CCM.  2. Cardiogenic (+/- septic) shock: Echo with EF 15-20%.  Off pressors, IABP out.  CVP 9, co-ox 60%.  -  Volume status improved. Will hold off on further lasix for now.  Creatinine trending down. - Continue spironolactone 12.5 daily  - Continue hydralazine 37.5 mg tid and isordil 20 tid. 3. CAD: Suspect NSTEMI with TnI now > 65 and low EF.  No cath/intervention yet.  - Continue ASA 81, statin.   4. AKI: Suspect triggered by shock.  Creatinine stable 5. Neuro: Now s/p PEA arrest with CPR x 2.  Not following commands.  Head MRI with multiple CVAs.  At this point, think prognosis poor with anoxic encephalopathy.  6. Thrombocytopenia: Plts stable 7. Elevated LFTs: Shock liver. LFTs now coming down. 8. Anemia: Received 1 unit PRBCs 3/8.  Continues to bleed from NGT and ETT. Getting blood today.   Chances of meaningful recovery are minimal. We have nothing left to offer him. Family meeting today. HF team will sign off. Please call if we can help.   Glori Bickers, MD  11/15/2015 10:01 AM Advanced Heart Failure Team Pager [phone] (M-F; 7a - 4p)  Please contact [facility] for night-coverage after hours (4p -7a ) and weekends on [email or web address]

## 2015-11-16 ENCOUNTER — Inpatient Hospital Stay (HOSPITAL_COMMUNITY): Payer: Medicare Other

## 2015-11-16 ENCOUNTER — Encounter (HOSPITAL_COMMUNITY): Payer: Self-pay | Admitting: *Deleted

## 2015-11-16 LAB — RENAL FUNCTION PANEL
Albumin: 2.1 g/dL — ABNORMAL LOW (ref 3.5–5.0)
Anion gap: 7 (ref 5–15)
BUN: 67 mg/dL — AB (ref 6–20)
CALCIUM: 8.1 mg/dL — AB (ref 8.9–10.3)
CHLORIDE: 101 mmol/L (ref 101–111)
CO2: 30 mmol/L (ref 22–32)
CREATININE: 2 mg/dL — AB (ref 0.61–1.24)
GFR calc Af Amer: 38 mL/min — ABNORMAL LOW (ref >60)
GFR calc non Af Amer: 33 mL/min — ABNORMAL LOW (ref >60)
Glucose, Bld: 173 mg/dL — ABNORMAL HIGH (ref 65–99)
Phosphorus: 4.1 mg/dL (ref 2.5–4.6)
Potassium: 4.4 mmol/L (ref 3.5–5.1)
SODIUM: 138 mmol/L (ref 135–145)

## 2015-11-16 LAB — TYPE AND SCREEN
ABO/RH(D): B NEG
ANTIBODY SCREEN: NEGATIVE
UNIT DIVISION: 0
UNIT DIVISION: 0
Unit division: 0

## 2015-11-16 LAB — CBC
HCT: 28.2 % — ABNORMAL LOW (ref 39.0–52.0)
HEMOGLOBIN: 9.5 g/dL — AB (ref 13.0–17.0)
MCH: 27.8 pg (ref 26.0–34.0)
MCHC: 33.7 g/dL (ref 30.0–36.0)
MCV: 82.5 fL (ref 78.0–100.0)
PLATELETS: 185 10*3/uL (ref 150–400)
RBC: 3.42 MIL/uL — ABNORMAL LOW (ref 4.22–5.81)
RDW: 15.3 % (ref 11.5–15.5)
WBC: 15.9 10*3/uL — ABNORMAL HIGH (ref 4.0–10.5)

## 2015-11-16 LAB — GLUCOSE, CAPILLARY
GLUCOSE-CAPILLARY: 125 mg/dL — AB (ref 65–99)
GLUCOSE-CAPILLARY: 154 mg/dL — AB (ref 65–99)
GLUCOSE-CAPILLARY: 113 mg/dL — AB (ref 65–99)
Glucose-Capillary: 132 mg/dL — ABNORMAL HIGH (ref 65–99)
Glucose-Capillary: 101 mg/dL — ABNORMAL HIGH (ref 65–99)
Glucose-Capillary: 138 mg/dL — ABNORMAL HIGH (ref 65–99)

## 2015-11-16 LAB — TRIGLYCERIDES: Triglycerides: 92 mg/dL (ref <150)

## 2015-11-16 MED ORDER — PROPOFOL 1000 MG/100ML IV EMUL
INTRAVENOUS | Status: AC
  Filled 2015-11-16: qty 100

## 2015-11-16 MED ORDER — FUROSEMIDE 10 MG/ML IJ SOLN
INTRAMUSCULAR | Status: AC
  Filled 2015-11-16: qty 4

## 2015-11-16 MED ORDER — FUROSEMIDE 10 MG/ML IJ SOLN
40.0000 mg | Freq: Once | INTRAMUSCULAR | Status: AC
  Administered 2015-11-16: 40 mg via INTRAVENOUS

## 2015-11-16 MED ORDER — PROPOFOL 1000 MG/100ML IV EMUL
0.0000 ug/kg/min | INTRAVENOUS | Status: DC
  Administered 2015-11-16 – 2015-11-17 (×2): 20 ug/kg/min via INTRAVENOUS
  Administered 2015-11-18 (×2): 25 ug/kg/min via INTRAVENOUS
  Administered 2015-11-18: 20 ug/kg/min via INTRAVENOUS
  Filled 2015-11-16 (×6): qty 100

## 2015-11-16 NOTE — Progress Notes (Addendum)
[facility] Progress Note Patient Name: William Mccullough DOB: May 18, 1946 MRN: [id number]   Date of Service  11/16/2015  HPI/Events of Note  Vomiting per RN  eICU Interventions  Check kub and cxr Dc tube feeds Check stat cbc, bme, lactic, lft, lipoase, trop, mag , phos     Intervention Category Intermediate Interventions: Other:  Ji Feldner 11/16/2015, 11:45 PM

## 2015-11-16 NOTE — Progress Notes (Signed)
Pt began coughing and copious amounts of frank red hemoptysis was seen in ETT, HME, and ballard. RT attempted to suction pt but was unable to pass the catheter through ett, as there was a mucous plug. RT used 10 cc of saline down ett and then manually ventilated pt for approximately 2 minutes. Pt again had a mucous plug and was manually ventilated again after not being able to pass suction catheter. Dr Halford Chessman called to bedside. Pt placed on ventilator and suctioned in-line and copious amounts of frank red frothy hemoptysis was removed through ett. RN at bedside. RT will closely monitor pt.

## 2015-11-16 NOTE — Progress Notes (Signed)
RT called to bedside by RN as pt was having mucous plugs and unable to ventilate pt. RN was manually ventilating pt upon arrival to room. RT pushed 10cc of saline down ett and manually ventilated pt for approximately 1 minute. Pt eventually became easier to ventilate and was placed back on ventilator and suctioned through in-line suction multiple times removing copious amounts of frank red hemoptysis (mucous plugs and frothy). RN and Agricultural consultant at bedside, e-link RN made aware who videoed into the room to observe. RT will closely monitor pt.

## 2015-11-16 NOTE — Progress Notes (Signed)
PULMONARY / CRITICAL CARE MEDICINE   Name: William Mccullough MRN: [id number] DOB: November 19, 1945    ADMISSION DATE:  11/14/2015  REFERRING MD:  EDP  CHIEF COMPLAINT:  SOB  SUBJECTIVE:  Difficulty with oxygenation overnight.  VITAL SIGNS: BP 224/98 mmHg  Pulse 103  Temp(Src) 99.6 F (37.6 C) (Oral)  Resp 19  Ht 5\' 9"  (1.753 m)  Wt 130 lb 15.3 oz (59.4 kg)  BMI 19.33 kg/m2  SpO2 99%  HEMODYNAMICS: CVP:  [4 mmHg-14 mmHg] 4 mmHg  VENTILATOR SETTINGS: Vent Mode:  [-] PRVC FiO2 (%):  [40 %-50 %] 40 % Set Rate:  [12 bmp] 12 bmp Vt Set:  [460 mL] 460 mL PEEP:  [5 cmH20] 5 cmH20 Plateau Pressure:  [11 cmH20-28 cmH20] 28 cmH20  INTAKE / OUTPUT: I/O last 3 completed shifts: In: 5090.2 [I.V.:1480.2; Blood:670; NG/GT:2790; IV Piggyback:150] Out: [id number] [Urine:3230]   PHYSICAL EXAMINATION: General: ill appearing Neuro: opens eyes spontaneously HEENT: ETT in place Cardiac: regular, tachy Chest: b/l crackles with decreased BS at bases Abd: soft, non tender Ext: 1+ edema Skin: no rashes  LABS:  BMET  Recent Labs Lab 11/14/15 0515 11/15/15 0540 11/16/15 0430  NA 141 141 138  K 4.0 4.4 4.4  CL 98* 101 101  CO2 29 31 30   BUN 55* 68* 67*  CREATININE 2.19* 2.37* 2.00*  GLUCOSE 165* 145* 173*    Electrolytes  Recent Labs Lab 11/12/15 0330 11/13/15 0401  11/14/15 0515 11/15/15 0540 11/16/15 0430  CALCIUM 8.1* 8.0*  < > 8.0* 7.8* 8.1*  MG 1.9 2.0  --  1.8  --   --   PHOS 3.2 3.5  < > 3.9 4.3 4.1  < > = values in this interval not displayed.  CBC  Recent Labs Lab 11/15/15 0540 11/15/15 1810 11/16/15 0430  WBC 15.2* 14.9* 15.9*  HGB 6.1* 8.7* 9.5*  HCT 17.8* 26.0* 28.2*  PLT 167 158 185    Coag's  Recent Labs Lab 11/10/15 1645  APTT 34  INR 1.15    Sepsis Markers No results for input(s): LATICACIDVEN, PROCALCITON, O2SATVEN in the last 168 hours.  ABG No results for input(s): PHART, PCO2ART, PO2ART in the last 168 hours.  Liver  Enzymes  Recent Labs Lab 11/10/15 0447  11/14/15 0515 11/15/15 0540 11/16/15 0430  AST 48*  --   --   --   --   ALT 163*  --   --   --   --   ALKPHOS 63  --   --   --   --   BILITOT 0.6  --   --   --   --   ALBUMIN 2.0*  < > 2.1* 1.8* 2.1*  < > = values in this interval not displayed.  Cardiac Enzymes No results for input(s): TROPONINI, PROBNP in the last 168 hours.  Glucose  Recent Labs Lab 11/15/15 1208 11/15/15 1632 11/15/15 1951 11/15/15 2338 11/16/15 0402 11/16/15 0800  GLUCAP 163* 163* 125* 140* 154* 113*    Imaging Dg Chest Port 1 View  11/16/2015  CLINICAL DATA:  Respiratory failure EXAM: PORTABLE CHEST 1 VIEW COMPARISON:  11/14/2015 FINDINGS: Endotracheal tube terminates 5 cm above the carina. Cardiomegaly with mild interstitial edema, unchanged. Moderate right and small left pleural effusions. No pneumothorax. Left IJ venous catheter terminates in the mid SVC. Enteric tube courses into the stomach. IMPRESSION: Endotracheal tube terminates 5 cm above the carina. Cardiomegaly with mild interstitial edema, unchanged. Moderate right and small left pleural effusions.  Electronically Signed   By: Julian Hy M.D.   On: 11/16/2015 09:20   STUDIES:  3/02 Echo >> EF 15 to 20%, mid/mod TR, PAS 33 mmHg 3/03 CT head >> old basal gangli infarct b/l 3/03 EEG >> diffuse slowing 3/06 MRI brain >> acute/subacute infarcts Lt frontoparietal region, b/l occipital lobes, b/l cerebellar hemispheres, advanced atrophy, remote infarcts b/l basal ganglia/thalami/pons  CULTURES: 3/02 Influenza PCR >> negative 3/02 RSV >> negative 3/02 Legionella Ag >> negative 3/02 Pneumococcal Ag >> positive 3/02 Sputum >> negative  ANTIBIOTICS: 3/01 Zithromax >> 3/01 3/01 Zosyn >> 3/01 3/01 Vancomycin >> 3/02 3/01 Rocephin >>   SIGNIFICANT EVENTS: 3/02 Admit, cardiology consulted, IABP, renal consult 3/04 d/c IABP 3/06 Off pressors, milrinone 3/08 renal s/o 3/11 transfuse 2 units  PRBC  LINES/TUBES: 3/02 ETT >> 3/02 Lt IJ CVL >>  DISCUSSION: 70 yo male smoker found unresponsive and hypoxia with brief PEA cardiac arrest >> ROSC after 2 minutes.  Found to have pneumonia with pneumococcal Ag positive, and acute systolic CHF with septic/cardiogenic shock.  He has hx of prostate cancer s/p XRT, CAD, ETOH, HTN, HLD  ASSESSMENT / PLAN:  PULMONARY A: Acute respiratory failure with hypoxia 2nd to pneumococcal PNA, and acute systolic CHF. Hx of tobacco abuse. Hemoptysis. B/l pleural effusions. P:  Full vent support Adjust PEEP/FiO2 to keep SpO2 > 92% Might need thoracentesis  CARDIOVASCULAR A: Septic shock, cardiogenic shock >> resolved. PEA cardiac arrest on admission 2nd to respiratory failure. Acute systolic CHF. HTN. P:  Hold ASA in setting of hemoptysis Lipitor, lasix, hydralazine, isordil, aldactone per cardiology  RENAL A:   AKI from [facility] >> would not be good candidate for HD. P:   Monitor renal fx, urine outpt  GASTROINTESTINAL A:   Nutrition. P:   Continue tube feeds Protonix for SUP  HEMATOLOGIC / ONCOLOGIC A:   Anemia of critical illness. P:  F/u CBC Transfuse for Hb < 7 SCDs  INFECTIOUS A:   Septic shock from pneumococcal PNA. P:   Day 12/14 rocephin  ENDOCRINE A:   Hyperglycemia - no hx DM. P:   SSI  NEUROLOGIC A:   Acute encephalopathy with global ischemia after cardiac arrest and respiratory failure. P:   RASS goal -1 Change to diprivan 3/12  CC time 34 minutes.  Chesley Mires, MD [facility] Pulmonary/Critical Care 11/16/2015, 10:01 AM Pager:  [phone] After 3pm call: [phone]

## 2015-11-17 ENCOUNTER — Inpatient Hospital Stay (HOSPITAL_COMMUNITY): Payer: Medicare Other

## 2015-11-17 LAB — CBC
HCT: 27.2 % — ABNORMAL LOW (ref 39.0–52.0)
HEMATOCRIT: 23.2 % — AB (ref 39.0–52.0)
HEMOGLOBIN: 7.8 g/dL — AB (ref 13.0–17.0)
HEMOGLOBIN: 9 g/dL — AB (ref 13.0–17.0)
MCH: 28 pg (ref 26.0–34.0)
MCH: 28.4 pg (ref 26.0–34.0)
MCHC: 33.1 g/dL (ref 30.0–36.0)
MCHC: 33.6 g/dL (ref 30.0–36.0)
MCV: 84.4 fL (ref 78.0–100.0)
MCV: 84.5 fL (ref 78.0–100.0)
PLATELETS: 252 10*3/uL (ref 150–400)
Platelets: 213 10*3/uL (ref 150–400)
RBC: 2.75 MIL/uL — AB (ref 4.22–5.81)
RBC: 3.22 MIL/uL — ABNORMAL LOW (ref 4.22–5.81)
RDW: 15.7 % — AB (ref 11.5–15.5)
RDW: 16.2 % — ABNORMAL HIGH (ref 11.5–15.5)
WBC: 18.4 10*3/uL — AB (ref 4.0–10.5)
WBC: 21.6 10*3/uL — ABNORMAL HIGH (ref 4.0–10.5)

## 2015-11-17 LAB — PHOSPHORUS: PHOSPHORUS: 4.9 mg/dL — AB (ref 2.5–4.6)

## 2015-11-17 LAB — BASIC METABOLIC PANEL
Anion gap: 13 (ref 5–15)
BUN: 80 mg/dL — AB (ref 6–20)
CALCIUM: 8.4 mg/dL — AB (ref 8.9–10.3)
CO2: 29 mmol/L (ref 22–32)
CREATININE: 2.44 mg/dL — AB (ref 0.61–1.24)
Chloride: 98 mmol/L — ABNORMAL LOW (ref 101–111)
GFR calc Af Amer: 30 mL/min — ABNORMAL LOW (ref >60)
GFR, EST NON AFRICAN AMERICAN: 26 mL/min — AB (ref >60)
GLUCOSE: 170 mg/dL — AB (ref 65–99)
Potassium: 3.9 mmol/L (ref 3.5–5.1)
Sodium: 140 mmol/L (ref 135–145)

## 2015-11-17 LAB — GLUCOSE, CAPILLARY
GLUCOSE-CAPILLARY: 109 mg/dL — AB (ref 65–99)
GLUCOSE-CAPILLARY: 111 mg/dL — AB (ref 65–99)
GLUCOSE-CAPILLARY: 122 mg/dL — AB (ref 65–99)
GLUCOSE-CAPILLARY: 162 mg/dL — AB (ref 65–99)
GLUCOSE-CAPILLARY: 88 mg/dL (ref 65–99)
GLUCOSE-CAPILLARY: 94 mg/dL (ref 65–99)

## 2015-11-17 LAB — RENAL FUNCTION PANEL
Albumin: 2.1 g/dL — ABNORMAL LOW (ref 3.5–5.0)
Anion gap: 11 (ref 5–15)
BUN: 81 mg/dL — ABNORMAL HIGH (ref 6–20)
CHLORIDE: 99 mmol/L — AB (ref 101–111)
CO2: 30 mmol/L (ref 22–32)
Calcium: 8.2 mg/dL — ABNORMAL LOW (ref 8.9–10.3)
Creatinine, Ser: 2.4 mg/dL — ABNORMAL HIGH (ref 0.61–1.24)
GFR, EST AFRICAN AMERICAN: 31 mL/min — AB (ref >60)
GFR, EST NON AFRICAN AMERICAN: 26 mL/min — AB (ref >60)
Glucose, Bld: 97 mg/dL (ref 65–99)
PHOSPHORUS: 4.6 mg/dL (ref 2.5–4.6)
POTASSIUM: 4.2 mmol/L (ref 3.5–5.1)
Sodium: 140 mmol/L (ref 135–145)

## 2015-11-17 LAB — HEPATIC FUNCTION PANEL
ALT: 70 U/L — ABNORMAL HIGH (ref 17–63)
AST: 63 U/L — ABNORMAL HIGH (ref 15–41)
Albumin: 2.3 g/dL — ABNORMAL LOW (ref 3.5–5.0)
Alkaline Phosphatase: 70 U/L (ref 38–126)
BILIRUBIN DIRECT: 0.1 mg/dL (ref 0.1–0.5)
BILIRUBIN INDIRECT: 0.7 mg/dL (ref 0.3–0.9)
BILIRUBIN TOTAL: 0.8 mg/dL (ref 0.3–1.2)
Total Protein: 5.7 g/dL — ABNORMAL LOW (ref 6.5–8.1)

## 2015-11-17 LAB — MAGNESIUM: Magnesium: 2 mg/dL (ref 1.7–2.4)

## 2015-11-17 LAB — TROPONIN I: TROPONIN I: 2.16 ng/mL — AB (ref <0.031)

## 2015-11-17 LAB — CK TOTAL AND CKMB (NOT AT ARMC)
CK TOTAL: 166 U/L (ref 49–397)
CK, MB: 4.4 ng/mL (ref 0.5–5.0)
Relative Index: 2.7 — ABNORMAL HIGH (ref 0.0–2.5)

## 2015-11-17 LAB — LIPASE, BLOOD: Lipase: 16 U/L (ref 11–51)

## 2015-11-17 LAB — LACTIC ACID, PLASMA: LACTIC ACID, VENOUS: 0.9 mmol/L (ref 0.5–2.0)

## 2015-11-17 MED ORDER — SODIUM CHLORIDE 0.9 % IV SOLN
8.0000 mg | Freq: Once | INTRAVENOUS | Status: AC
  Administered 2015-11-17: 8 mg via INTRAVENOUS
  Filled 2015-11-17: qty 4

## 2015-11-17 MED ORDER — ONDANSETRON HCL 4 MG/2ML IJ SOLN
INTRAMUSCULAR | Status: AC
  Administered 2015-11-17: 4 mg
  Filled 2015-11-17: qty 2

## 2015-11-17 MED ORDER — ONDANSETRON HCL 4 MG/2ML IJ SOLN: 4.0000 mg | Freq: Once | INTRAMUSCULAR | Status: AC

## 2015-11-17 NOTE — Progress Notes (Signed)
I met with pt's family.  Reviewed current status.  Explained main concern is chance for neuro recovery given hx of CVA and now with global ischemic changes.  Explained that he would likely need trach and long term vent support, and would need nursing home placement.  They do not think he would want these measures.  They would like to discuss further, and will let medical team know once they make a decision.  Chesley Mires, MD [facility] Pulmonary/Critical Care 11/17/2015, 10:49 AM Pager:  [phone] After 3pm call: [phone]

## 2015-11-17 NOTE — Care Management Important Message (Signed)
Important Message  Patient Details  Name: William Mccullough MRN: [id number] Date of Birth: 1946/07/26   Medicare Important Message Given:  Other (see comment) (not given per ncm)    Piotr Christopher P Anaia Frith 11/17/2015, 11:52 AM

## 2015-11-17 NOTE — Progress Notes (Signed)
CRITICAL VALUE ALERT  Critical value received:  Tropin 2.16  Date of notification:  11/17/14  Time of notification:  [id number]  Critical value read back: yes  Nurse who received alert:  Idelia Salm RN   MD notified: CCMD notified at 0144am 3/13, Tropin down from >65.

## 2015-11-17 NOTE — Progress Notes (Signed)
PULMONARY / CRITICAL CARE MEDICINE   Name: William Mccullough MRN: [id number] DOB: [birth year]    ADMISSION DATE:  12/04/2015  REFERRING MD:  EDP  CHIEF COMPLAINT:  SOB  SUBJECTIVE:  Episodes of vomiting overnight.  VITAL SIGNS: BP 147/82 mmHg  Pulse 79  Temp(Src) 98.6 F (37 C) (Oral)  Resp 14  Ht 5\' 9"  (1.753 m)  Wt 127 lb 10.3 oz (57.9 kg)  BMI 18.84 kg/m2  SpO2 100%  HEMODYNAMICS: CVP:  [4 mmHg-15 mmHg] 5 mmHg  VENTILATOR SETTINGS: Vent Mode:  [-] PRVC FiO2 (%):  [50 %-80 %] 70 % Set Rate:  [16 bmp] 16 bmp Vt Set:  [460 mL-480 mL] 460 mL PEEP:  [10 cmH20] 10 cmH20 Plateau Pressure:  [20 cmH20-28 cmH20] 26 cmH20  INTAKE / OUTPUT: I/O last 3 completed shifts: In: 2968.1 [I.V.:1334.1; NG/GT:1430; IV [id number] Out: 2870 [Urine:2070; Emesis/NG output:800]   PHYSICAL EXAMINATION: General: ill appearing Neuro: RASS -3, agitated with WUA HEENT: ETT in place Cardiac: regular, tachy Chest: b/l crackles with decreased BS at bases Abd: mild distention Ext: no edema Skin: no rashes  LABS:  BMET  Recent Labs Lab 11/16/15 0430 11/17/15 0030 11/17/15 0430  NA 138 140 140  K 4.4 3.9 4.2  CL 101 98* 99*  CO2 30 29 30   BUN 67* 80* 81*  CREATININE 2.00* 2.44* 2.40*  GLUCOSE 173* 170* 97    Electrolytes  Recent Labs Lab 11/13/15 0401  11/14/15 0515  11/16/15 0430 11/17/15 0030 11/17/15 0430  CALCIUM 8.0*  < > 8.0*  < > 8.1* 8.4* 8.2*  MG 2.0  --  1.8  --   --  2.0  --   PHOS 3.5  < > 3.9  < > 4.1 4.9* 4.6  < > = values in this interval not displayed.  CBC  Recent Labs Lab 11/16/15 0430 11/17/15 0030 11/17/15 0430  WBC 15.9* 21.6* 18.4*  HGB 9.5* 9.0* 7.8*  HCT 28.2* 27.2* 23.2*  PLT 185 252 213    Coag's  Recent Labs Lab 11/10/15 1645  APTT 34  INR 1.15    Sepsis Markers  Recent Labs Lab 11/17/15 0030  LATICACIDVEN 0.9    ABG No results for input(s): PHART, PCO2ART, PO2ART in the last 168 hours.  Liver Enzymes  Recent  Labs Lab 11/16/15 0430 11/17/15 0030 11/17/15 0430  AST  --  63*  --   ALT  --  70*  --   ALKPHOS  --  70  --   BILITOT  --  0.8  --   ALBUMIN 2.1* 2.3* 2.1*    Cardiac Enzymes  Recent Labs Lab 11/17/15 0030  TROPONINI 2.16*    Glucose  Recent Labs Lab 11/16/15 1207 11/16/15 1625 11/16/15 1956 11/17/15 0012 11/17/15 0432 11/17/15 0753  GLUCAP 132* 101* 138* 162* 94 111*    Imaging Dg Chest Port 1 View  11/17/2015  CLINICAL DATA:  Endotracheal tube advanced EXAM: PORTABLE CHEST 1 VIEW COMPARISON:  Yesterday FINDINGS: Endotracheal tube tip is just below the clavicular heads. The orogastric tube is in good position. Left IJ central line with tip at the SVC level. Normal heart size and mediastinal contours. Improved retrocardiac aeration with now visible left diaphragm. Interstitial and airspace opacities are now asymmetric to the right. No visible effusion or pneumothorax. IMPRESSION: 1. Tubes and central line are in good position. 2. Interval improvement in left lower lobe aeration. Electronically Signed   By: Monte Fantasia M.D.   On: 11/17/2015  02:27   Dg Chest Port 1 View  11/17/2015  CLINICAL DATA:  Vomiting and possible aspiration EXAM: PORTABLE CHEST 1 VIEW COMPARISON:  Earlier today FINDINGS: Endotracheal tube tip is at the clavicular heads. The orogastric tube reaches the stomach. Left IJ central line with tip at the SVC level, no longer in the azygos. Improved volumes since prior. There is diffuse interstitial and airspace opacity with dense retrocardiac consolidation. Chronic cardiomegaly. IMPRESSION: 1. No change from earlier today to suggest interval aspiration. 2. Unremarkable positioning of tubes and central line. 3. Improved volumes. Unchanged edema and retrocardiac atelectasis or pneumonia. Electronically Signed   By: Monte Fantasia M.D.   On: 11/17/2015 00:44   Dg Abd Portable 1v  11/17/2015  CLINICAL DATA:  Acute onset of vomiting.  Initial encounter. EXAM:  PORTABLE ABDOMEN - 1 VIEW COMPARISON:  Abdominal radiograph performed 12/01/2015 FINDINGS: The visualized bowel gas pattern is unremarkable. The stomach is largely filled with air, with an enteric tube seen ending at the body of the stomach. Scattered air and stool filled loops of colon are seen; no abnormal dilatation of small bowel loops is seen to suggest small bowel obstruction. No free intra-abdominal air is identified, though evaluation for free air is limited on a single supine view. The visualized osseous structures are within normal limits; the sacroiliac joints are unremarkable in appearance. IMPRESSION: Enteric tube noted ending at the body of the stomach. Stomach largely filled with air. Unremarkable bowel gas pattern; no free intra-abdominal air seen. Electronically Signed   By: Garald Balding M.D.   On: 11/17/2015 00:44   STUDIES:  3/02 Echo >> EF 15 to 20%, mid/mod TR, PAS 33 mmHg 3/03 CT head >> old basal gangli infarct b/l 3/03 EEG >> diffuse slowing 3/06 MRI brain >> acute/subacute infarcts Lt frontoparietal region, b/l occipital lobes, b/l cerebellar hemispheres, advanced atrophy, remote infarcts b/l basal ganglia/thalami/pons  CULTURES: 3/02 Influenza PCR >> negative 3/02 RSV >> negative 3/02 Legionella Ag >> negative 3/02 Pneumococcal Ag >> positive 3/02 Sputum >> negative  ANTIBIOTICS: 3/01 Zithromax >> 3/01 3/01 Zosyn >> 3/01 3/01 Vancomycin >> 3/02 3/01 Rocephin >>   SIGNIFICANT EVENTS: 3/02 Admit, cardiology consulted, IABP, renal consult 3/04 d/c IABP 3/06 Off pressors, milrinone 3/08 renal s/o 3/11 transfuse 2 units PRBC  LINES/TUBES: 3/02 ETT >> 3/02 Lt IJ CVL >>  DISCUSSION: 70 yo male smoker found unresponsive and hypoxia with brief PEA cardiac arrest >> ROSC after 2 minutes.  Found to have pneumonia with pneumococcal Ag positive, and acute systolic CHF with septic/cardiogenic shock.  He has hx of prostate cancer s/p XRT, CAD, ETOH, HTN,  HLD  ASSESSMENT / PLAN:  PULMONARY A: Acute respiratory failure with hypoxia 2nd to pneumococcal PNA, and acute systolic CHF. Hx of tobacco abuse. Hemoptysis. B/l pleural effusions. P:  Full vent support Adjust PEEP/FiO2 to keep SpO2 > 92%  CARDIOVASCULAR A: Septic shock, cardiogenic shock >> resolved. PEA cardiac arrest on admission 2nd to respiratory failure. Acute systolic CHF. HTN. P:  Hold ASA in setting of hemoptysis Lipitor, lasix, hydralazine, isordil, aldactone per cardiology  RENAL A:   AKI from [facility] >> would not be good candidate for HD. P:   Monitor renal fx, urine outpt  GASTROINTESTINAL A:   Nutrition. Vomiting 3/13 P:   Hold tube feeds for now Protonix for SUP  HEMATOLOGIC / ONCOLOGIC A:   Anemia of critical illness. P:  F/u CBC Transfuse for Hb < 7 SCDs  INFECTIOUS A:   Septic shock  from pneumococcal PNA. P:   Day 13/14 rocephin  ENDOCRINE A:   Hyperglycemia - no hx DM. P:   SSI  NEUROLOGIC A:   Acute encephalopathy with global ischemia after cardiac arrest and respiratory failure. P:   RASS goal -1 Changed to diprivan 3/12  CC time 32 minutes.  Chesley Mires, MD [facility] Pulmonary/Critical Care 11/17/2015, 8:47 AM Pager:  [phone] After 3pm call: [phone]

## 2015-11-17 NOTE — Progress Notes (Signed)
eLink Physician-Brief Progress Note Patient Name: William Mccullough DOB: 07/30/1946 MRN: [id number]   Date of Service  11/17/2015  HPI/Events of Note   Recent Labs Lab 11/15/15 1810 11/16/15 0430 11/17/15 0030  HGB 8.7* 9.5* 9.0*  HCT 26.0* 28.2* 27.2*  WBC 14.9* 15.9* 21.6*  PLT 158 185 252    Recent Labs Lab 11/11/15 0445 11/12/15 0330 11/13/15 0401 11/13/15 0402 11/14/15 0515 11/15/15 0540 11/16/15 0430 11/17/15 0030  NA 147* 145 142 141 141 141 138 140  K 3.4* 3.9 4.2 4.2 4.0 4.4 4.4 3.9  CL 104 104 102 101 98* 101 101 98*  CO2 31 31 30 29 29 31 30 29   GLUCOSE 88 136* 115* 118* 165* 145* 173* 170*  BUN 45* 49* 48* 48* 55* 68* 67* 80*  CREATININE 3.30* 2.89* 2.25* 2.29* 2.19* 2.37* 2.00* 2.44*  CALCIUM 8.2* 8.1* 8.0* 8.0* 8.0* 7.8* 8.1* 8.4*  MG 1.9 1.9 2.0  --  1.8  --   --  2.0  PHOS 3.7 3.2 3.5 3.5 3.9 4.3 4.1 4.9*    Recent Labs Lab 11/10/15 0447 11/10/15 1645  11/13/15 0402 11/14/15 0515 11/15/15 0540 11/16/15 0430 11/17/15 0030  AST 48*  --   --   --   --   --   --  63*  ALT 163*  --   --   --   --   --   --  70*  ALKPHOS 63  --   --   --   --   --   --  70  BILITOT 0.6  --   --   --   --   --   --  0.8  PROT 4.6*  --   --   --   --   --   --  5.7*  ALBUMIN 2.0*  --   < > 1.9* 2.1* 1.8* 2.1* 2.3*  INR  --  1.15  --   --   --   --   --   --   < > = values in this interval not displayed.    Recent Labs Lab 11/17/15 0030  TROPONINI 2.16*    Recent Labs Lab 11/17/15 0030  LATICACIDVEN 0.9   Lipase - normal   eICU Interventions  Main abnormality is rising wbc  Plan monitor      Intervention Category Intermediate Interventions: Diagnostic test evaluation  Zymier Rodgers 11/17/2015, 3:00 AM

## 2015-11-18 ENCOUNTER — Inpatient Hospital Stay (HOSPITAL_COMMUNITY): Payer: Medicare Other

## 2015-11-18 LAB — RENAL FUNCTION PANEL
ALBUMIN: 2.4 g/dL — AB (ref 3.5–5.0)
ANION GAP: 15 (ref 5–15)
BUN: 84 mg/dL — ABNORMAL HIGH (ref 6–20)
CALCIUM: 8.8 mg/dL — AB (ref 8.9–10.3)
CO2: 28 mmol/L (ref 22–32)
Chloride: 100 mmol/L — ABNORMAL LOW (ref 101–111)
Creatinine, Ser: 2.91 mg/dL — ABNORMAL HIGH (ref 0.61–1.24)
GFR, EST AFRICAN AMERICAN: 24 mL/min — AB (ref >60)
GFR, EST NON AFRICAN AMERICAN: 21 mL/min — AB (ref >60)
GLUCOSE: 150 mg/dL — AB (ref 65–99)
PHOSPHORUS: 5.7 mg/dL — AB (ref 2.5–4.6)
Potassium: 4.4 mmol/L (ref 3.5–5.1)
SODIUM: 143 mmol/L (ref 135–145)

## 2015-11-18 LAB — GLUCOSE, CAPILLARY
GLUCOSE-CAPILLARY: 77 mg/dL (ref 65–99)
GLUCOSE-CAPILLARY: 100 mg/dL — AB (ref 65–99)
GLUCOSE-CAPILLARY: 97 mg/dL (ref 65–99)
Glucose-Capillary: 105 mg/dL — ABNORMAL HIGH (ref 65–99)
Glucose-Capillary: 141 mg/dL — ABNORMAL HIGH (ref 65–99)
Glucose-Capillary: 98 mg/dL (ref 65–99)

## 2015-11-18 LAB — CBC
HCT: 24.4 % — ABNORMAL LOW (ref 39.0–52.0)
HEMOGLOBIN: 8 g/dL — AB (ref 13.0–17.0)
MCH: 28.4 pg (ref 26.0–34.0)
MCHC: 32.8 g/dL (ref 30.0–36.0)
MCV: 86.5 fL (ref 78.0–100.0)
Platelets: 284 10*3/uL (ref 150–400)
RBC: 2.82 MIL/uL — AB (ref 4.22–5.81)
RDW: 16.6 % — ABNORMAL HIGH (ref 11.5–15.5)
WBC: 16.3 10*3/uL — AB (ref 4.0–10.5)

## 2015-11-18 MED ORDER — DEXTROSE 5 % IV SOLN
2.0000 g | Freq: Two times a day (BID) | INTRAVENOUS | Status: AC
  Administered 2015-11-18: 2 g via INTRAVENOUS
  Filled 2015-11-18: qty 2

## 2015-11-18 NOTE — Progress Notes (Signed)
PULMONARY / CRITICAL CARE MEDICINE   Name: William Mccullough MRN: [id number] DOB: December 15, 1945    ADMISSION DATE:  12/04/2015  REFERRING MD:  EDP  CHIEF COMPLAINT:  SOB  SUBJECTIVE:  Remains on full vent support.  VITAL SIGNS: BP 126/60 mmHg  Pulse 79  Temp(Src) 98 F (36.7 C) (Oral)  Resp 16  Ht 5\' 9"  (1.753 m)  Wt 124 lb 9 oz (56.5 kg)  BMI 18.39 kg/m2  SpO2 100%  HEMODYNAMICS: CVP:  [5 mmHg] 5 mmHg  VENTILATOR SETTINGS: Vent Mode:  [-] PRVC FiO2 (%):  [40 %-70 %] 40 % Set Rate:  [16 bmp] 16 bmp Vt Set:  [460 mL] 460 mL PEEP:  [10 cmH20] 10 cmH20 Plateau Pressure:  [19 cmH20-24 cmH20] 22 cmH20  INTAKE / OUTPUT: I/O last 3 completed shifts: In: 1399.6 [I.V.:970.6; NG/GT:225; IV Piggyback:204] Out: 2000 [Urine:1200; Emesis/NG output:800]   PHYSICAL EXAMINATION: General: ill appearing Neuro: RASS -3 HEENT: ETT in place Cardiac: regular, tachy Chest: b/l crackles with decreased BS at bases Abd: mild distention Ext: no edema Skin: no rashes  LABS:  BMET  Recent Labs Lab 11/17/15 0030 11/17/15 0430 11/18/15 0400  NA 140 140 143  K 3.9 4.2 4.4  CL 98* 99* 100*  CO2 29 30 28   BUN 80* 81* 84*  CREATININE 2.44* 2.40* 2.91*  GLUCOSE 170* 97 150*    Electrolytes  Recent Labs Lab 11/13/15 0401  11/14/15 0515  11/17/15 0030 11/17/15 0430 11/18/15 0400  CALCIUM 8.0*  < > 8.0*  < > 8.4* 8.2* 8.8*  MG 2.0  --  1.8  --  2.0  --   --   PHOS 3.5  < > 3.9  < > 4.9* 4.6 5.7*  < > = values in this interval not displayed.  CBC  Recent Labs Lab 11/17/15 0030 11/17/15 0430 11/18/15 0400  WBC 21.6* 18.4* 16.3*  HGB 9.0* 7.8* 8.0*  HCT 27.2* 23.2* 24.4*  PLT 252 213 284    Coag's No results for input(s): APTT, INR in the last 168 hours.  Sepsis Markers  Recent Labs Lab 11/17/15 0030  LATICACIDVEN 0.9    ABG No results for input(s): PHART, PCO2ART, PO2ART in the last 168 hours.  Liver Enzymes  Recent Labs Lab 11/17/15 0030  11/17/15 0430 11/18/15 0400  AST 63*  --   --   ALT 70*  --   --   ALKPHOS 70  --   --   BILITOT 0.8  --   --   ALBUMIN 2.3* 2.1* 2.4*    Cardiac Enzymes  Recent Labs Lab 11/17/15 0030  TROPONINI 2.16*    Glucose  Recent Labs Lab 11/17/15 1203 11/17/15 1553 11/17/15 2059 11/18/15 0039 11/18/15 0417 11/18/15 0744  GLUCAP 122* 88 109* 105* 141* 77    Imaging Dg Chest Port 1 View  11/18/2015  CLINICAL DATA:  Ventilator dependent respiratory failure. Followup pneumonia. EXAM: PORTABLE CHEST 1 VIEW COMPARISON:  11/17/2015 and earlier. FINDINGS: Endotracheal tube tip in satisfactory position projecting approximately 5 cm above the carina. Left jugular central venous catheter tip projects over the mid SVC, unchanged. Nasogastric tube courses below the diaphragm into the stomach. Since yesterday, redevelopment of dense left lower lobe atelectasis with silhouetting of the left hemidiaphragm. Interval progression of airspace opacities throughout the right lung and now minimally in the central left lung. IMPRESSION: 1. Support apparatus satisfactory. 2. Progressive pneumonia throughout the right lung and now centrally in the left lung since yesterday. Electronically  Signed   By: Evangeline Dakin M.D.   On: 11/18/2015 07:48   STUDIES:  3/02 Echo >> EF 15 to 20%, mid/mod TR, PAS 33 mmHg 3/03 CT head >> old basal gangli infarct b/l 3/03 EEG >> diffuse slowing 3/06 MRI brain >> acute/subacute infarcts Lt frontoparietal region, b/l occipital lobes, b/l cerebellar hemispheres, advanced atrophy, remote infarcts b/l basal ganglia/thalami/pons  CULTURES: 3/02 Influenza PCR >> negative 3/02 RSV >> negative 3/02 Legionella Ag >> negative 3/02 Pneumococcal Ag >> positive 3/02 Sputum >> negative  ANTIBIOTICS: 3/01 Zithromax >> 3/01 3/01 Zosyn >> 3/01 3/01 Vancomycin >> 3/02 3/01 Rocephin >>   SIGNIFICANT EVENTS: 3/02 Admit, cardiology consulted, IABP, renal consult 3/04 d/c IABP 3/06  Off pressors, milrinone 3/08 renal s/o 3/11 transfuse 2 units PRBC 3/13 family meeting >> family wants to reach out to other relatives before proceeding with vent withdrawal  LINES/TUBES: 3/02 ETT >> 3/02 Lt IJ CVL >>  DISCUSSION: 70 yo male smoker found unresponsive and hypoxia with brief PEA cardiac arrest >> ROSC after 2 minutes.  Found to have pneumonia with pneumococcal Ag positive, and acute systolic CHF with septic/cardiogenic shock.  He has hx of prostate cancer s/p XRT, CAD, ETOH, HTN, HLD  ASSESSMENT / PLAN:  PULMONARY A: Acute respiratory failure with hypoxia 2nd to pneumococcal PNA, and acute systolic CHF. Hx of tobacco abuse. Hemoptysis. B/l pleural effusions. P:  Full vent support Adjust PEEP/FiO2 to keep SpO2 > 92%  CARDIOVASCULAR A: Septic shock, cardiogenic shock >> resolved. PEA cardiac arrest on admission 2nd to respiratory failure. Acute systolic CHF. HTN. P:  Hold ASA in setting of hemoptysis Lipitor, lasix, hydralazine, isordil, aldactone per cardiology  RENAL A:   AKI from [facility] >> would not be good candidate for HD. P:   Monitor renal fx, urine outpt  GASTROINTESTINAL A:   Nutrition. Vomiting 3/13 P:   Hold tube feeds for now Protonix for SUP  HEMATOLOGIC / ONCOLOGIC A:   Anemia of critical illness. P:  F/u CBC Transfuse for Hb < 7 SCDs  INFECTIOUS A:   Septic shock from pneumococcal PNA. P:   Day 14/14 rocephin >> d/c after dose on 3/14  ENDOCRINE A:   Hyperglycemia - no hx DM. P:   SSI  NEUROLOGIC A:   Acute encephalopathy with global ischemia after cardiac arrest and respiratory failure. P:   RASS goal -1 Changed to diprivan 3/12  Goals of care >> will make DNR based on conversation with family 3/13.  They will try to contact other family/friends to give opportunity for them to visit.  After that, they will proceed with vent withdrawal and transition to comfort measures.  They will call later today to notify medical  team when they are ready to proceed with vent withdrawal.  Chesley Mires, MD [place] 11/18/2015, 11:05 AM Pager:  [phone] After 3pm call: [phone]

## 2015-11-18 NOTE — Progress Notes (Signed)
Nutrition Follow-up  DOCUMENTATION CODES:   Underweight, Severe malnutrition in context of chronic illness  INTERVENTION:   Continue Vital AF 1.2 @ 55 ml/hr Provides: 1584 kcal (102% of needs), 99 grams protein, and 1070 ml H2O. Total free water: 1670 ml  NUTRITION DIAGNOSIS:   Malnutrition related to chronic illness as evidenced by severe depletion of body fat, severe depletion of muscle mass. Ongoing.   GOAL:   Patient will meet greater than or equal to 90% of their needs Met.   MONITOR:   TF tolerance, Skin, Vent status, Labs  ASSESSMENT:   Pt admitted on 11/20/2015 with SOB. He is noted s/p PEA arrest on IABP and with AKI and PNA.   Per MD note plan to transition pt to comfort care once family arrives.   Free water: 200 ml TID = 600 ml Patient is currently intubated on ventilator support MV: 6.2 L/min Temp (24hrs), Avg:98.1 F (36.7 C), Min:98 F (36.7 C), Max:98.2 F (36.8 C)  Labs reviewed: phosphorus elevated 5.7  Diet Order:  Diet NPO time specified  Skin:  Wound (see comment) (stage I sacrum)  Last BM:  3/8  Height:   Ht Readings from Last 1 Encounters:  11/16/2015 _0  (1.753 m)    Weight:   Wt Readings from Last 1 Encounters:  11/18/15 124 lb 9 oz (56.5 kg)    Ideal Body Weight:  72.7 kg  BMI:  Body mass index is 18.39 kg/(m^2).  Estimated Nutritional Needs:   Kcal:  1194  Protein:  85-100  Fluid:  >1.5 L/day  EDUCATION NEEDS:   No education needs identified at this time  [place], [place], [place] Pager [phone] After Hours Pager

## 2015-11-19 LAB — CBC
HEMATOCRIT: 20.2 % — AB (ref 39.0–52.0)
Hemoglobin: 6.7 g/dL — CL (ref 13.0–17.0)
MCH: 29 pg (ref 26.0–34.0)
MCHC: 33.2 g/dL (ref 30.0–36.0)
MCV: 87.4 fL (ref 78.0–100.0)
Platelets: 264 10*3/uL (ref 150–400)
RBC: 2.31 MIL/uL — ABNORMAL LOW (ref 4.22–5.81)
RDW: 16.9 % — AB (ref 11.5–15.5)
WBC: 14.5 10*3/uL — ABNORMAL HIGH (ref 4.0–10.5)

## 2015-11-19 LAB — BASIC METABOLIC PANEL
Anion gap: 12 (ref 5–15)
BUN: 84 mg/dL — AB (ref 6–20)
CO2: 29 mmol/L (ref 22–32)
Calcium: 8.5 mg/dL — ABNORMAL LOW (ref 8.9–10.3)
Chloride: 103 mmol/L (ref 101–111)
Creatinine, Ser: 2.74 mg/dL — ABNORMAL HIGH (ref 0.61–1.24)
GFR calc Af Amer: 26 mL/min — ABNORMAL LOW (ref >60)
GFR, EST NON AFRICAN AMERICAN: 22 mL/min — AB (ref >60)
GLUCOSE: 112 mg/dL — AB (ref 65–99)
POTASSIUM: 4.5 mmol/L (ref 3.5–5.1)
Sodium: 144 mmol/L (ref 135–145)

## 2015-11-19 LAB — GLUCOSE, CAPILLARY
GLUCOSE-CAPILLARY: 112 mg/dL — AB (ref 65–99)
Glucose-Capillary: 107 mg/dL — ABNORMAL HIGH (ref 65–99)

## 2015-11-19 MED ORDER — SODIUM CHLORIDE 0.9 % IV SOLN
25.0000 ug/h | INTRAVENOUS | Status: DC
  Filled 2015-11-19: qty 50

## 2015-11-19 MED ORDER — ACETAMINOPHEN 160 MG/5ML PO SOLN: 650.0000 mg | Freq: Four times a day (QID) | ORAL | Status: DC | PRN

## 2015-11-19 MED ORDER — MIDAZOLAM BOLUS VIA INFUSION (WITHDRAWAL LIFE SUSTAINING TX)
5.0000 mg | INTRAVENOUS | Status: DC | PRN
  Administered 2015-11-19: 5 mg via INTRAVENOUS
  Filled 2015-11-19 (×2): qty 20

## 2015-11-19 MED ORDER — ATROPINE SULFATE 1 % OP SOLN
4.0000 [drp] | OPHTHALMIC | Status: DC | PRN
  Filled 2015-11-19: qty 2

## 2015-11-19 MED ORDER — SODIUM CHLORIDE 0.9 % IV SOLN
1.0000 mg/h | INTRAVENOUS | Status: DC
  Administered 2015-11-19: 2 mg/h via INTRAVENOUS
  Filled 2015-11-19: qty 10

## 2015-11-19 MED ORDER — ACETAMINOPHEN 650 MG RE SUPP: 650.0000 mg | RECTAL | Status: DC | PRN

## 2015-11-19 MED ORDER — FENTANYL BOLUS VIA INFUSION
50.0000 ug | INTRAVENOUS | Status: DC | PRN
  Administered 2015-11-19: 50 ug via INTRAVENOUS
  Filled 2015-11-19: qty 200

## 2015-12-06 NOTE — Progress Notes (Signed)
   11-28-2015 1200  Clinical Encounter Type  Visited With Patient;Health care provider  Visit Type Patient actively dying  Referral From [place] responding to EOL consult; difficult and complex family situation, so family not present; Ramona prayer offered at bedside with RN who is offering support in this Holy time; [facility] available as needed. 12:12 PM Gwynn Burly

## 2015-12-06 NOTE — Progress Notes (Signed)
PCCM PROGRESS NOTE  William Mccullough is a 70 y.o. male admitted on 11/06/2015 with PEA cardiac arrest.  He had ROSC after 2 minutes.  He was found to have Pneumococcal pneumonia.  He developed acute systolic CHF.  Cardiology was consulted.  He had IABP placed, and was on pressors and inotropes.  He had acute kidney injury and renal was consulted.  He was eventually weaned off pressors and did not need dialysis.  He did not have improvement in his mental status.  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  Family conference held on 3/13 >> decide for DNR status.  Family wanted to contact other relatives to give them opportunity to visit, but plan is to proceed with comfort care.  Subjective: Unable to tolerate vent weaning.  Vital signs: BP 119/74 mmHg  Pulse 79  Temp(Src) 98.2 F (36.8 C) (Oral)  Resp 16  Ht 5\' 9"  (1.753 m)  Wt 121 lb 11.1 oz (55.2 kg)  BMI 17.96 kg/m2  SpO2 100%  Intake/output: I/O last 3 completed shifts: In: 1247.4 [I.V.:987.4; NG/GT:60; IV Piggyback:200] Out: 1570 [Urine:1570]  Vent settings: Vent Mode:  [-] PRVC FiO2 (%):  [30 %-40 %] 30 % Set Rate:  [16 bmp] 16 bmp Vt Set:  [460 mL] 460 mL PEEP:  [10 cmH20] 10 cmH20 Plateau Pressure:  [14 cmH20-24 cmH20] 14 cmH20  General: ill appearing Neuro: unresponsive on WUA Cardiac: regular, 2/6 SM  Chest: b/l crackles Abd: soft, non tender Ext: no edema HEENT: ETT in place Skin: no rashes   CMP Latest Ref Rng 10-Dec-2015 11/18/2015 11/17/2015  Glucose 65 - 99 mg/dL 112(H) 150(H) 97  BUN 6 - 20 mg/dL 84(H) 84(H) 81(H)  Creatinine 0.61 - 1.24 mg/dL 2.74(H) 2.91(H) 2.40(H)  Sodium 135 - 145 mmol/L 144 143 140  Potassium 3.5 - 5.1 mmol/L 4.5 4.4 4.2  Chloride 101 - 111 mmol/L 103 100(L) 99(L)  CO2 22 - 32 mmol/L 29 28 30   Calcium 8.9 -  10.3 mg/dL 8.5(L) 8.8(L) 8.2(L)  Total Protein 6.5 - 8.1 g/dL - - -  Total Bilirubin 0.3 - 1.2 mg/dL - - -  Alkaline Phos 38 - 126 U/L - - -  AST 15 - 41 U/L - - -  ALT 17 - 63 U/L - - -    CBC Latest Ref Rng 12-10-2015 11/18/2015 11/17/2015  WBC 4.0 - 10.5 K/uL 14.5(H) 16.3(H) 18.4(H)  Hemoglobin 13.0 - 17.0 g/dL 6.7(LL) 8.0(L) 7.8(L)  Hematocrit 39.0 - 52.0 % 20.2(L) 24.4(L) 23.2(L)  Platelets 150 - 400 K/uL 264 284 213    Dg Chest Port 1 View  11/18/2015  CLINICAL DATA:  Ventilator dependent respiratory failure. Followup pneumonia. EXAM: PORTABLE CHEST 1 VIEW COMPARISON:  11/17/2015 and earlier. FINDINGS: Endotracheal tube tip in satisfactory position projecting approximately 5 cm above the carina. Left jugular central venous catheter tip projects over the mid SVC, unchanged. Nasogastric tube courses below the diaphragm into the stomach. Since yesterday, redevelopment of dense left lower lobe atelectasis with silhouetting of the left hemidiaphragm. Interval progression of airspace opacities throughout the right lung and now minimally in the central left lung. IMPRESSION: 1. Support apparatus satisfactory. 2. Progressive pneumonia throughout the right lung and now centrally in the left lung since yesterday. Electronically Signed   By: Evangeline Dakin M.D.   On: 11/18/2015 07:48    Assessment: Acute hypoxic respiratory  failure History of tobacco abuse Hemoptysis >> resolved Acute pulmonary edema with pleural effusions Pneumococcal community acquired pneumonia PEA cardiac arrest Septic shock Cardiogenic shock Acute systolic CHF Hypertension Acute kidney injury with ATN Moderate protein calorie malnutrition Vomiting Anemia of critical illness Hyperglycemia Anoxic encephalopathy Persistent vegetative state DNR  Plan: Continue full vent support for now Continue tube feeds for now No escalation of care No additional lab tests, imaging studies Continue diprivan/fentanyl gtt for  comfort Prn tylenol Proceed with vent withdrawal when family is ready  Chesley Mires, MD [place] Dec 07, 2015, 8:15 AM Pager:  [phone] After 3pm call: [phone]

## 2015-12-06 NOTE — Progress Notes (Addendum)
Two RNs pronounced death, Rosebud Poles RN and Ysidro Evert. Heart sounds auscultated for one minute. No heart tones noted.    3mL of versed and 156mL of fentanyl wasted in sink. [place], Ardeth Sportsman

## 2015-12-06 NOTE — Discharge Summary (Signed)
  William Mccullough was a 70 y.o. male admitted on 11/30/2015 with PEA cardiac arrest.  He had ROSC after 2 minutes.  He was found to have Pneumococcal pneumonia.  He developed acute systolic CHF.  Cardiology was consulted.  He had IABP placed, and was on pressors and inotropes.  He had acute kidney injury and renal was consulted.  He was eventually weaned off pressors and did not need dialysis.  He did not have improvement in his mental status.  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  Family conference held on 3/13 >> decide for DNR status.  He was transitioned to comfort measures on 13-Dec-2015, extubated, and subsequently expired.  Final diagnoses: Acute hypoxic respiratory failure History of tobacco abuse Hemoptysis >> resolved Acute pulmonary edema with pleural effusions Pneumococcal community acquired pneumonia PEA cardiac arrest Septic shock Cardiogenic shock Acute systolic CHF Hypertension Acute kidney injury with ATN Moderate protein calorie malnutrition Vomiting Anemia of critical illness Hyperglycemia Anoxic encephalopathy Persistent vegetative state DNR  Chesley Mires, MD [place] 12-13-15, 5:50 PM

## 2015-12-06 NOTE — Procedures (Signed)
Extubation Procedure Note  Patient Details:   Name: William Mccullough DOB: March 24, 1946 MRN: [id number]   Airway Documentation:  Airway 7.5 mm (Active)  Secured at (cm) 23 cm 12-08-15  8:03 AM  Measured From Lips 12/08/15  8:03 AM  [place] 2015/12/08  8:03 AM  Secured By [employer] 2015-12-08  8:03 AM  Tube Holder Repositioned Yes 12-08-15  8:03 AM  Cuff Pressure (cm H2O) 24 cm H2O 11/18/2015  7:29 PM  Site Condition Dry 12/08/15  8:00 AM    Evaluation  O2 sats: currently acceptable Complications: No apparent complications Patient did tolerate procedure well. Bilateral Breath Sounds: Clear, Diminished Suctioning: Airway No  Hope Pigeon, MA 12/08/15, 1:08 PM

## 2015-12-06 DEATH — deceased

## 2015-12-18 ENCOUNTER — Telehealth: Payer: Self-pay

## 2015-12-18 NOTE — Telephone Encounter (Signed)
On 12/18/2015 I received a death certificate from [place] (original). The death certificate is for cremation. The patient is a patient of Doctor Sood. The death certificate will be taken to [facility] ICU Monday  Am (12/23/2015) for signature. On 2015/12/23 I received the death certificate back from Doctor Erick. I got the death certificate ready and called the funeral home to let them know the death certificate is ready for pickup. I also faxed them a copy per their request.

## 2016-07-21 IMAGING — US US RENAL
1 series · 14 of 25 positions shown · non-contrast
Comparison: None.

CLINICAL DATA: Acute renal injury

EXAM:
RENAL / URINARY TRACT ULTRASOUND COMPLETE

[Series 1: us renal · 0.20mm/px · 14 of 51 slices shown]
[im 1/51]
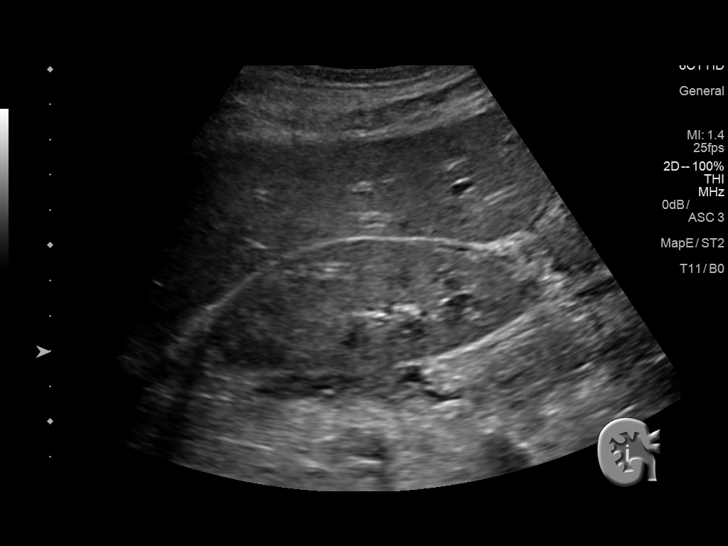
[im 5/51]
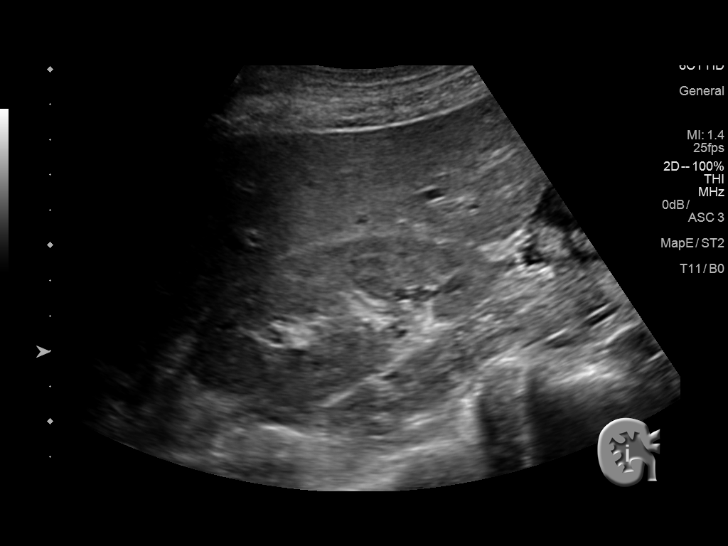
[im 9/51]
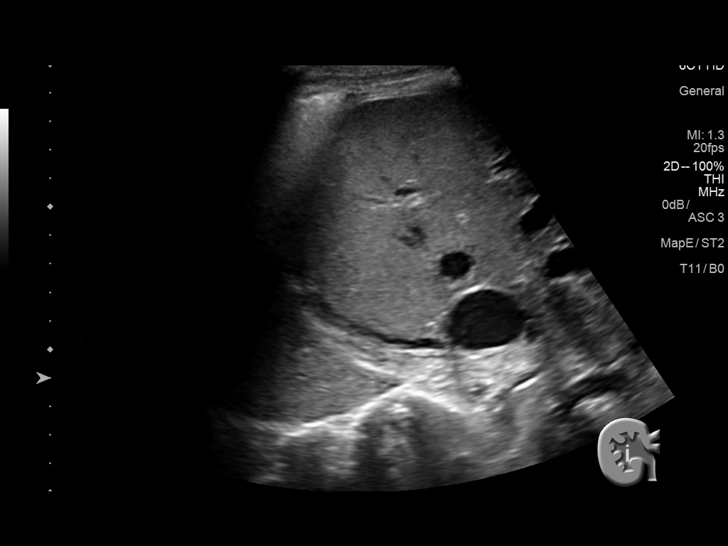
[im 13/51]
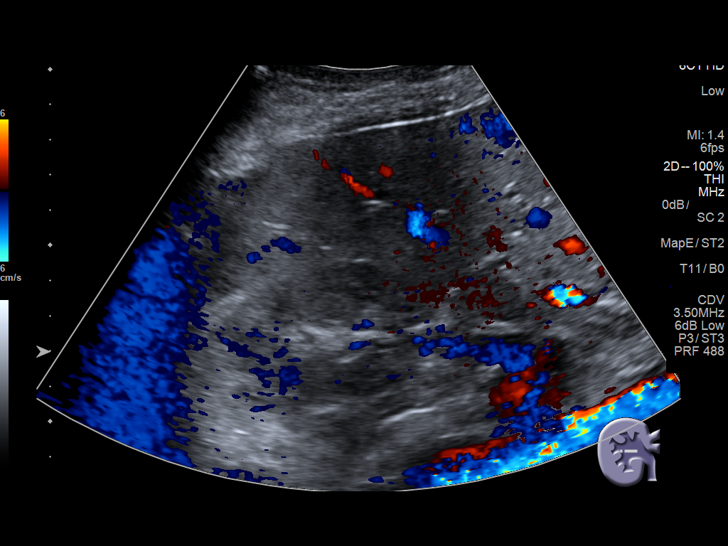
[im 17/51]
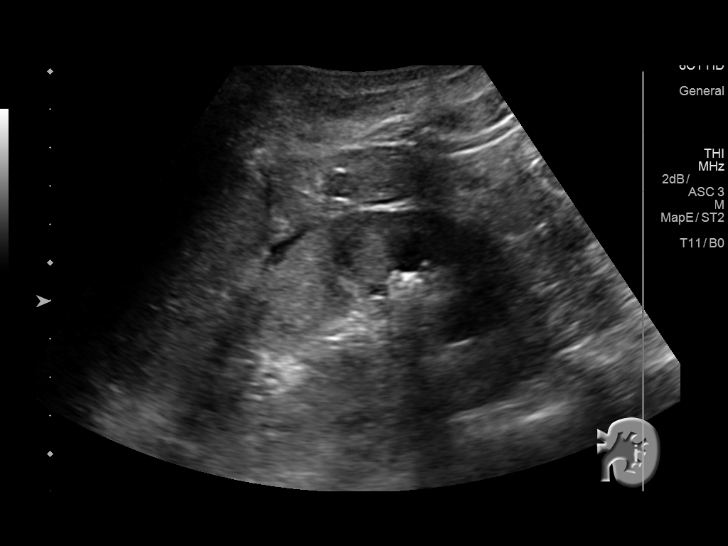
[im 19/51]
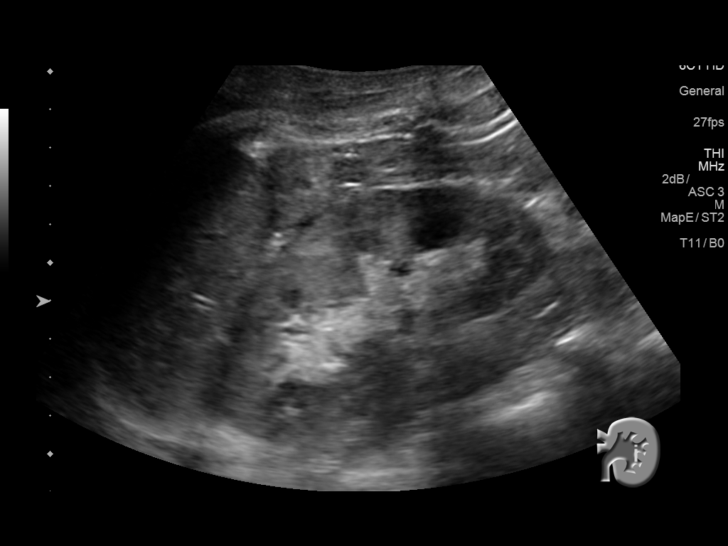
[im 23/51]
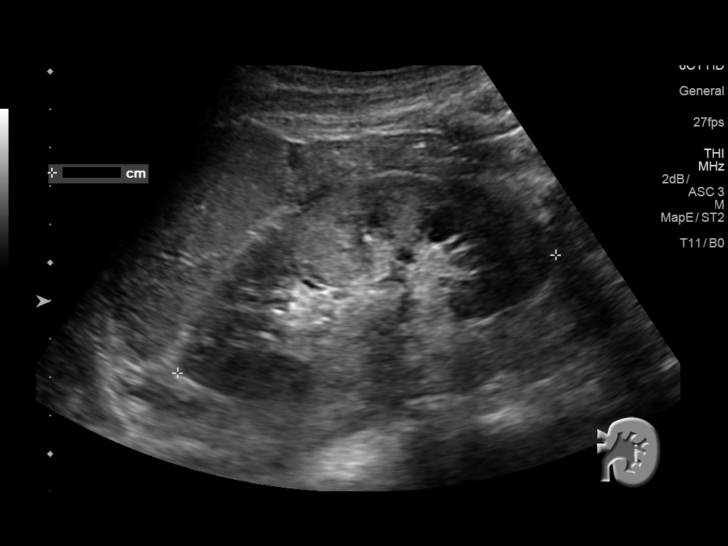
[im 28/51]
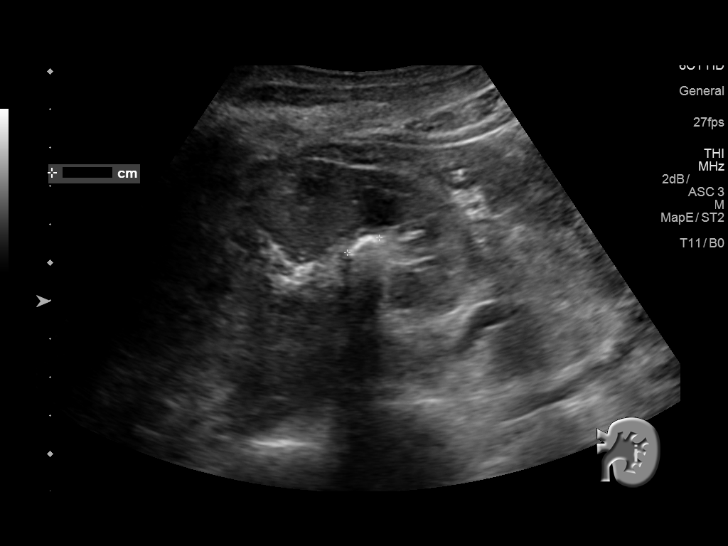
[im 32/51]
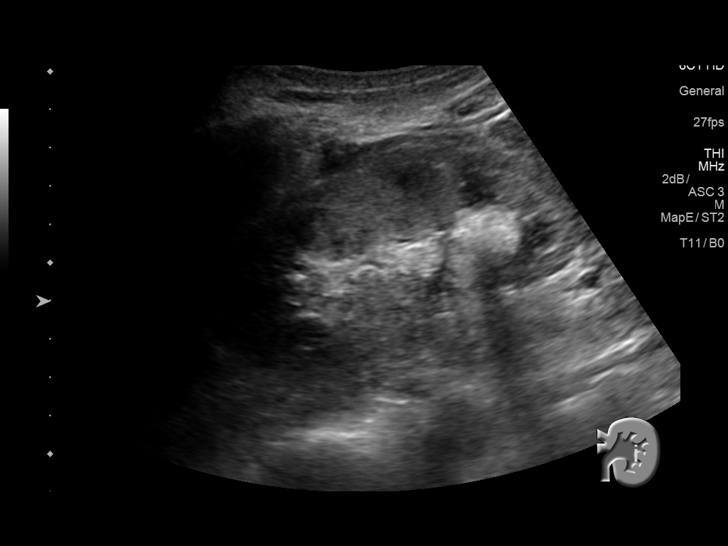
[im 34/51]
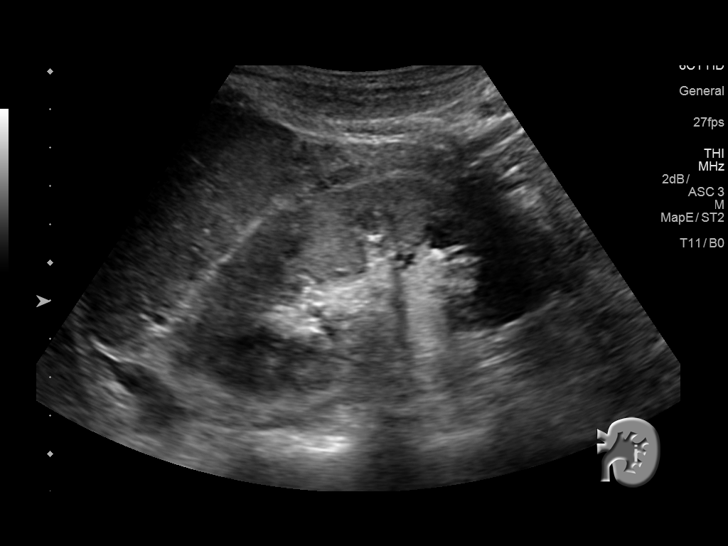
[im 38/51]
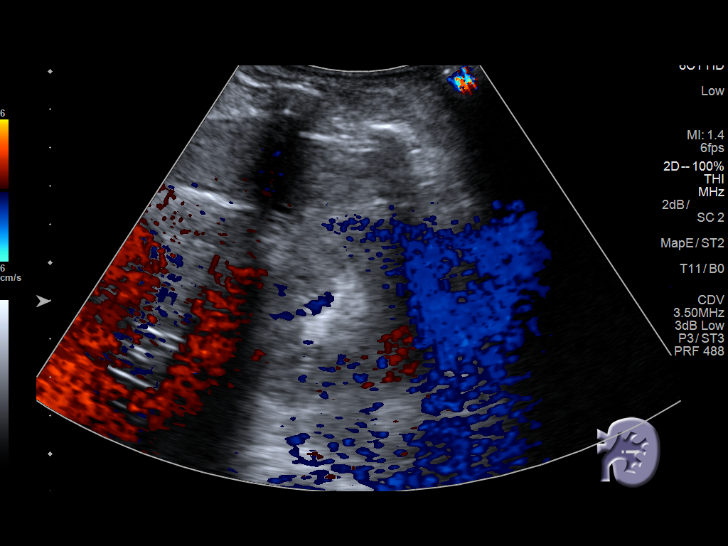
[im 42/51]
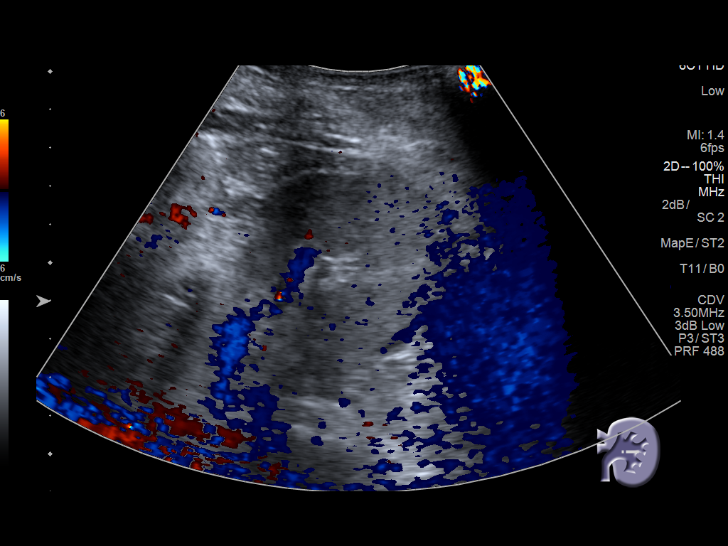
[im 46/51]
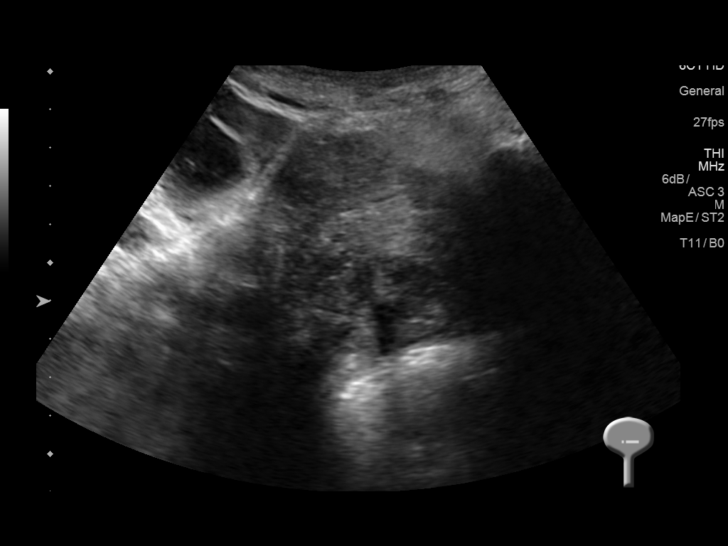
[im 51/51]
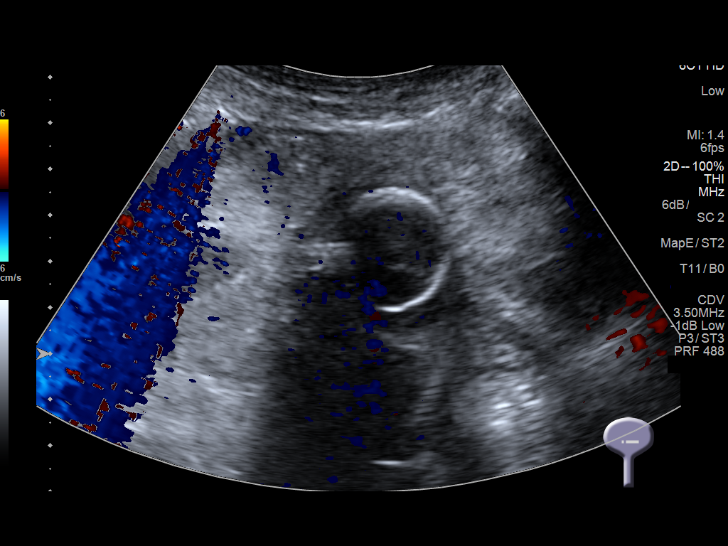

[14 of 25 positions shown; findings below may reference images not displayed]

FINDINGS: Right Kidney:

Length: 10.8 cm.. Echogenicity within normal limits. No mass or
hydronephrosis visualized.

Left Kidney:

Length: 10.3 cm.. No hydronephrosis is noted. Calcifications are
identified within the renal pelvis consistent with renal calculi.
The largest of these measures 9 mm.

Bladder:

Decompressed by Foley catheter.
IMPRESSION: Left renal calculi without acute abnormality.

## 2017-04-02 IMAGING — CR DG ABD PORTABLE 1V
1 series · 1 of 1 positions shown · non-contrast
Comparison: None.

CLINICAL DATA: 69-year-old male status post intubation and enteric
tube placement. Respiratory distress.

EXAM:
PORTABLE ABDOMEN - 1 VIEW

[AP]
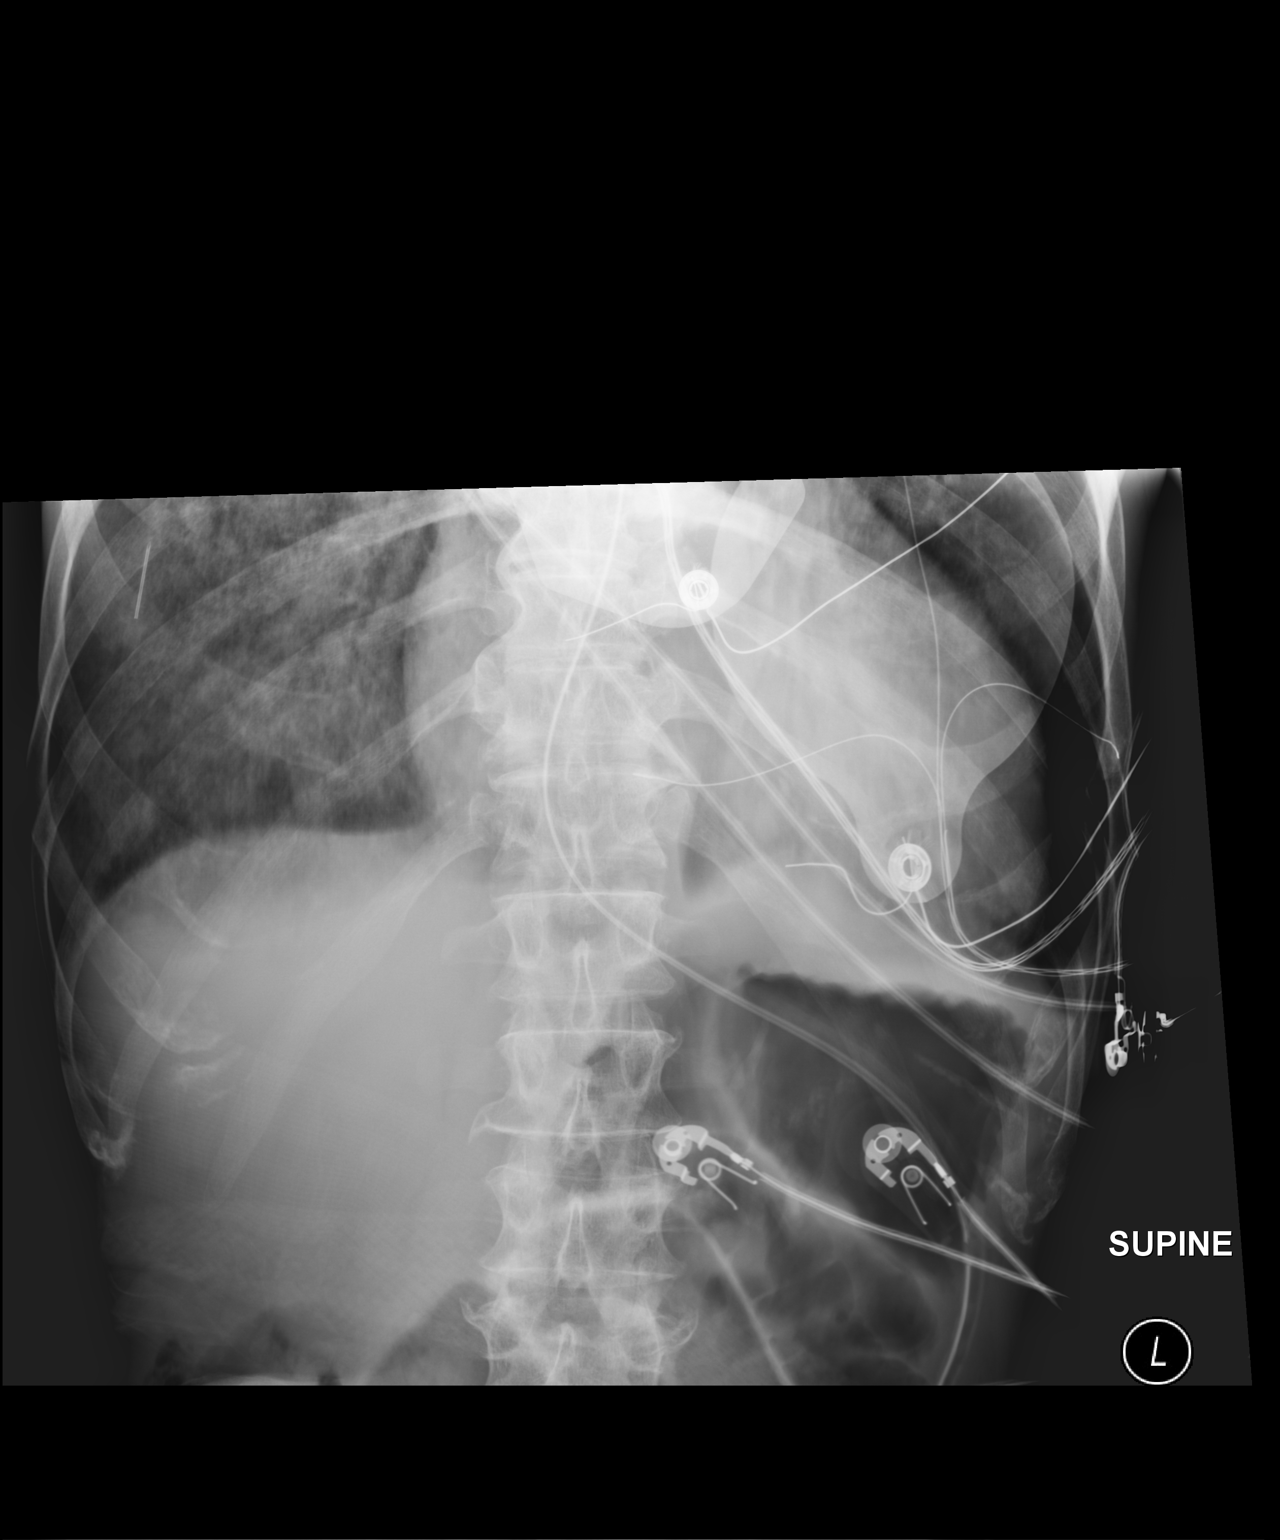

[1 of 1 positions shown; findings below may reference images not displayed]

FINDINGS: An endotracheal tube is partially visualized extending into the left
hemi abdomen with tip positioned in the left lower abdomen overlying
the iliac crest. Normal caliber air-filled loops of small bowel
noted.

There is cardiomegaly. There is diffuse hazy density of the right
lung base. A 2 cm linear radiopaque density over the right lung base
may be superimposed on the patient. Clinical correlation is
recommended.
IMPRESSION: Enteric tube the tip in the left lower abdomen superimposed over the
iliac crest.

## 2017-04-03 IMAGING — CR DG CHEST 1V PORT
1 series · 1 of 1 positions shown · non-contrast
Comparison: Chest x-ray from earlier same day and from 11/05/2015.

CLINICAL DATA: Central line placement

EXAM:
PORTABLE CHEST 1 VIEW

[AP]
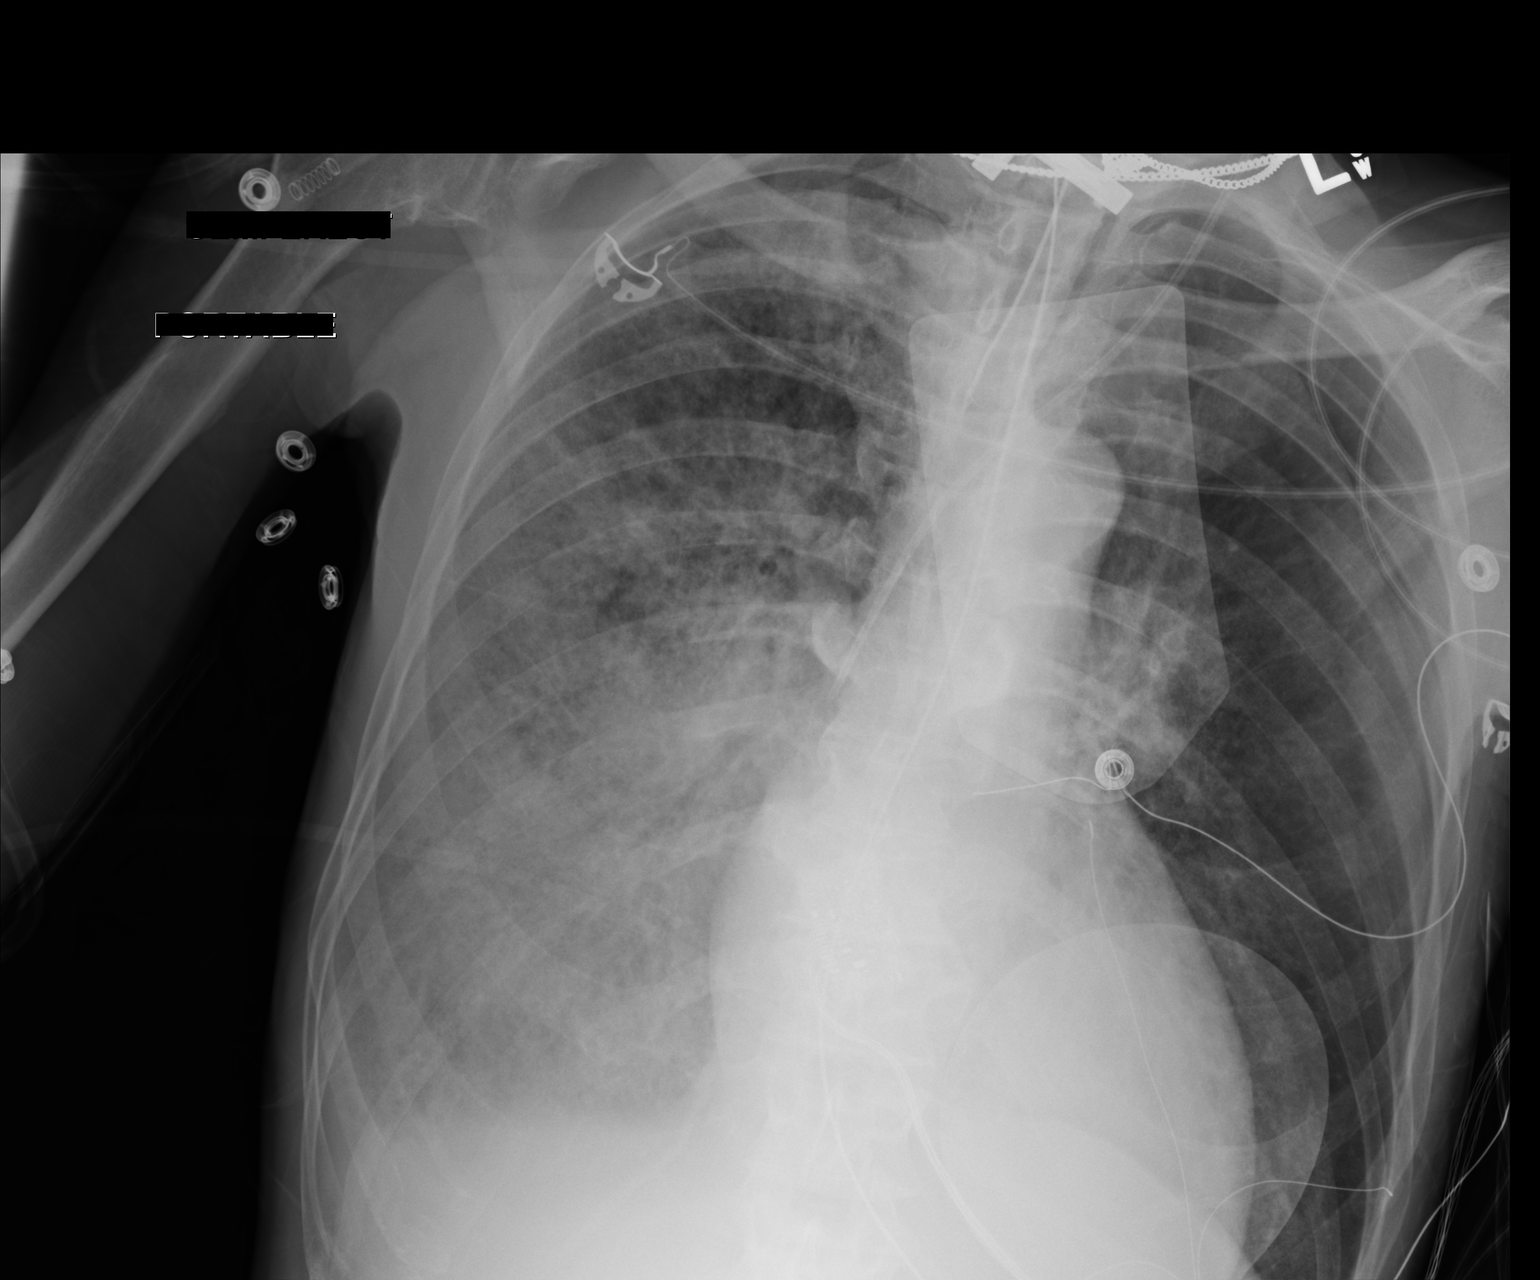

[1 of 1 positions shown; findings below may reference images not displayed]

FINDINGS: Heart size is stable. Overall cardiomediastinal silhouette is stable
in size and configuration. Endotracheal tube remains well positioned
with tip approximately 3 cm above the level of the carina.
Nasogastric tube passes below the diaphragm.

New left IJ central line in place with tip adequately positioned
over the mid SVC. No pneumothorax seen.

Diffuse airspace opacities are again noted throughout the right
lung. Again noted is a probable small right pleural effusion,
unchanged. No new lung findings.
IMPRESSION: 1. New left IJ central line appears adequately positioned with tip
projected over the mid SVC. No pneumothorax.
2. Otherwise stable chest x-ray. Diffuse airspace opacities
throughout the right lung, most prominent at the right lung base,
unchanged in the short-term interval, most suggestive of pneumonia
versus asymmetric pulmonary edema. Probable small right pleural
effusion. Left lung remains clear.

## 2017-04-04 IMAGING — CT CT HEAD W/O CM
1 of 2 series · 13 of 30 positions shown, 17 images · non-contrast
Comparison: None.

CLINICAL DATA: Altered mental status

EXAM:
CT HEAD WITHOUT CONTRAST
TECHNIQUE: Contiguous paraaxial images were obtained from the base of the skull
through the vertex without intravenous contrast.

[Series 1: — · axial · 0.49mm/px · z∈[-572,-392]mm · 13 of 42 slices shown, 17 images]
[im 3/42  brain]
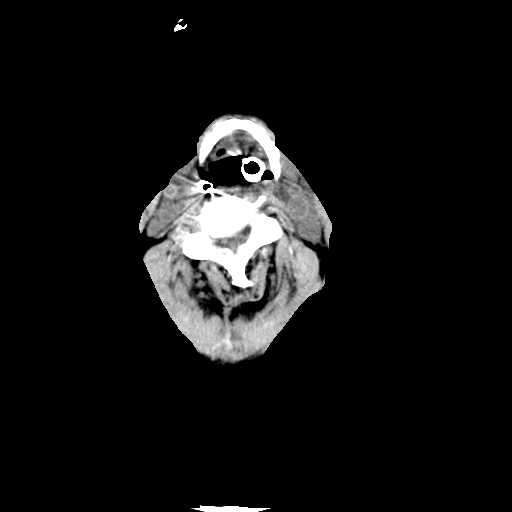
[im 3/42  bone]
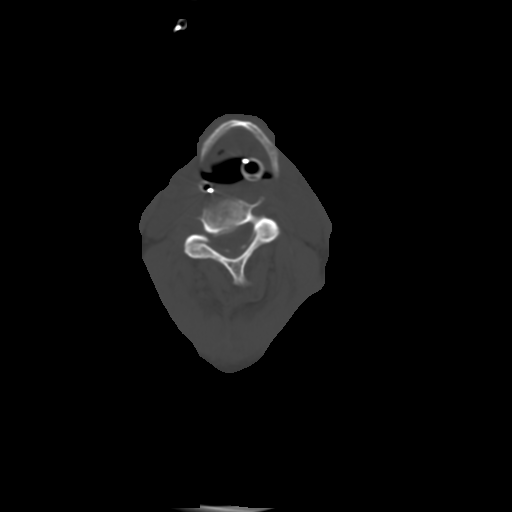
[im 6/42  brain]
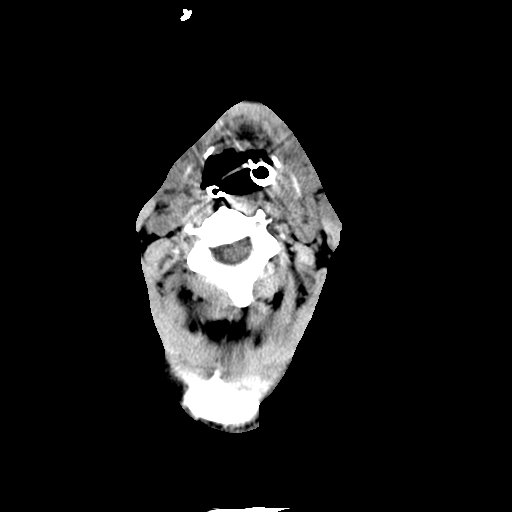
[im 9/42  brain]
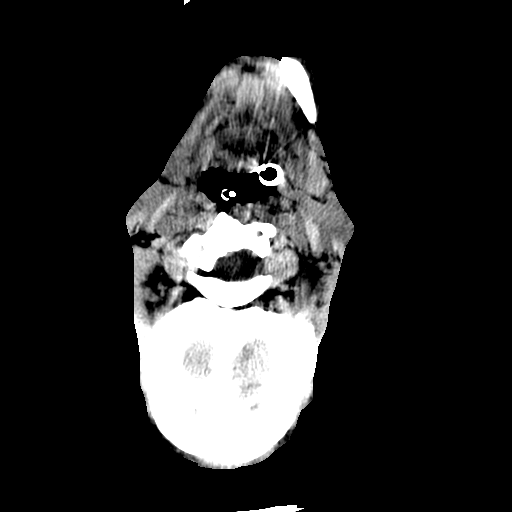
[im 12/42  brain]
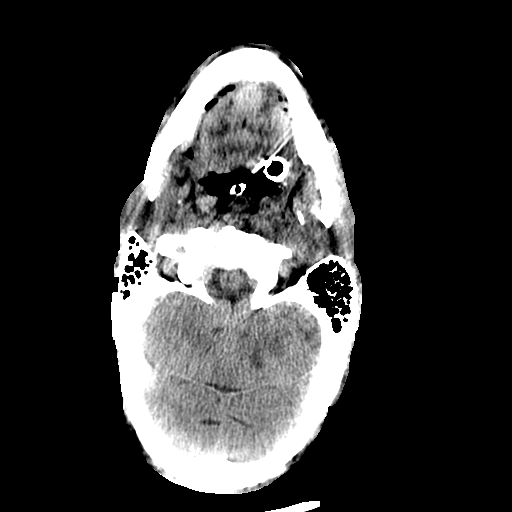
[im 15/42  brain]
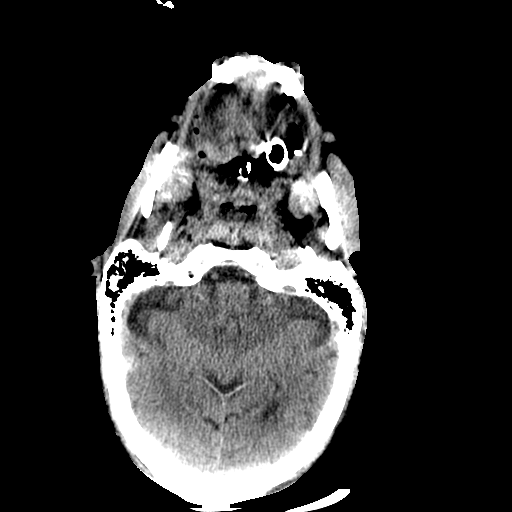
[im 15/42  bone]
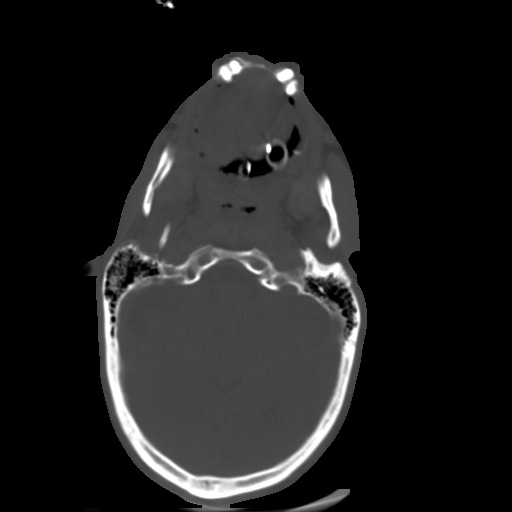
[im 18/42  brain]
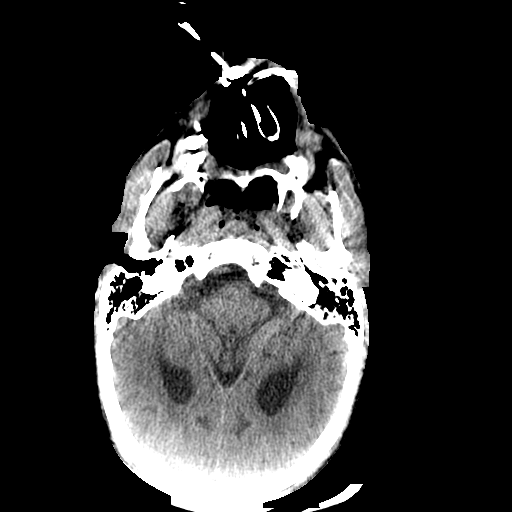
[im 21/42  brain]
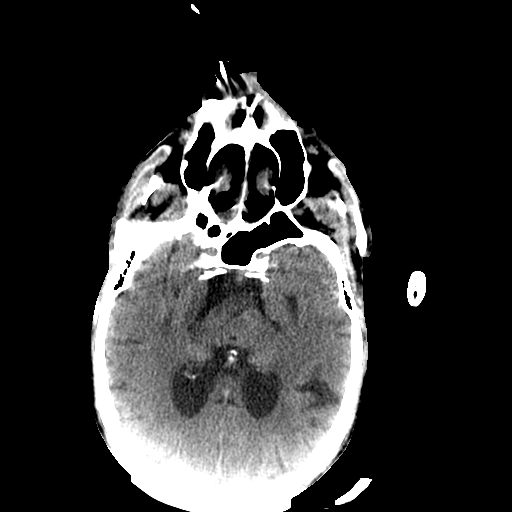
[im 24/42  brain]
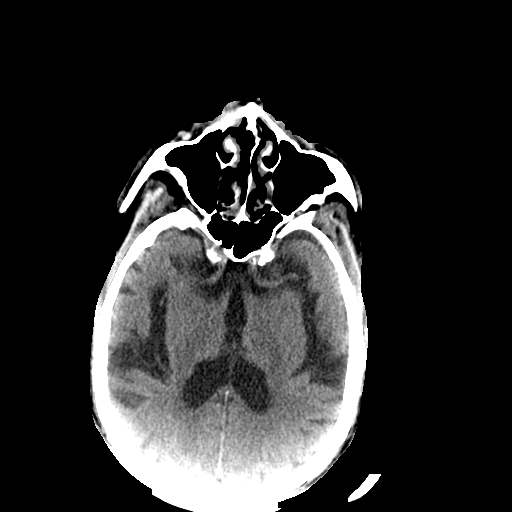
[im 27/42  brain]
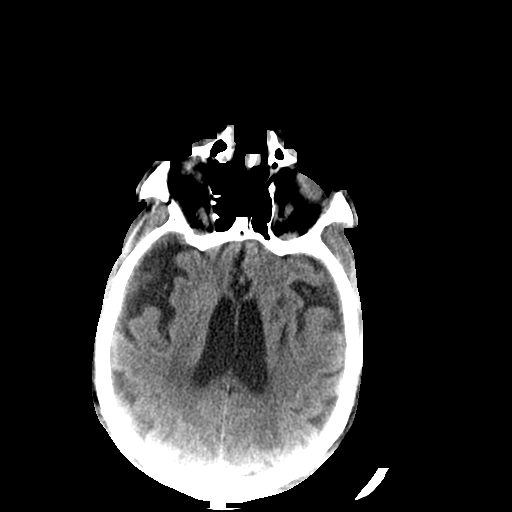
[im 27/42  bone]
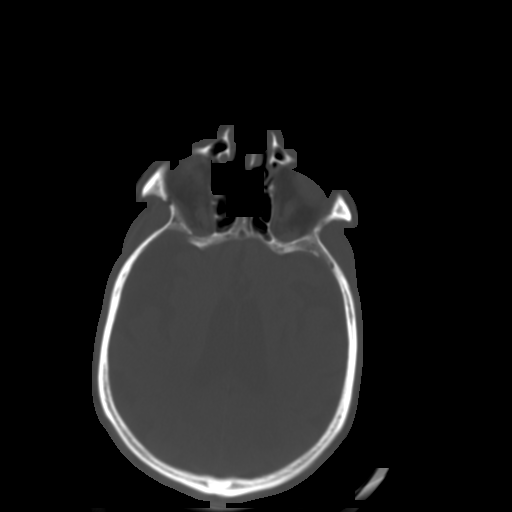
[im 30/42  brain]
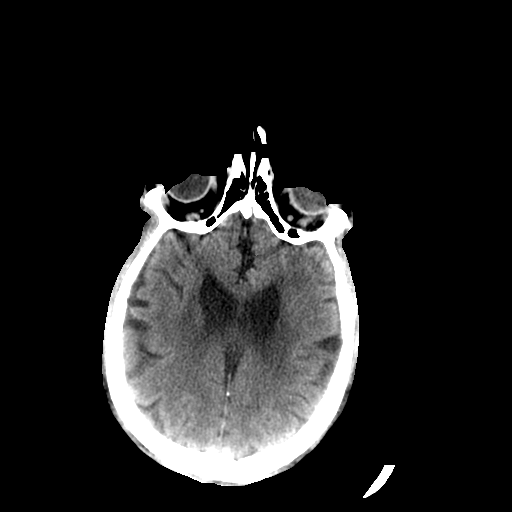
[im 33/42  brain]
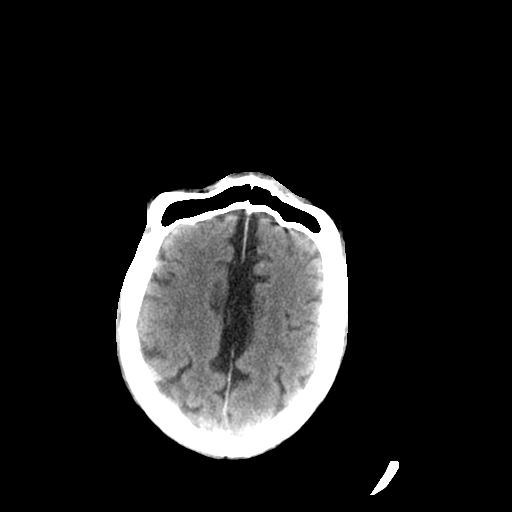
[im 36/42  brain]
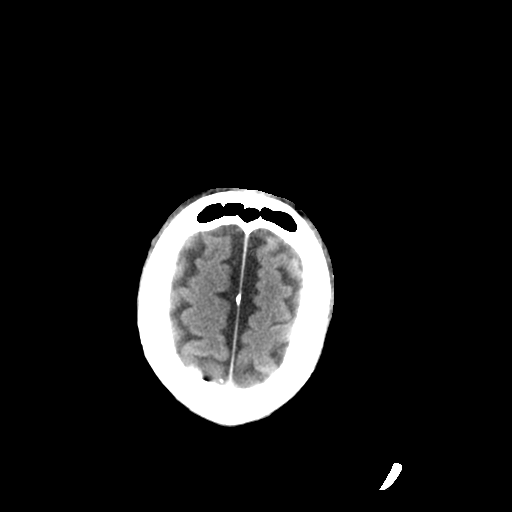
[im 39/42  brain]
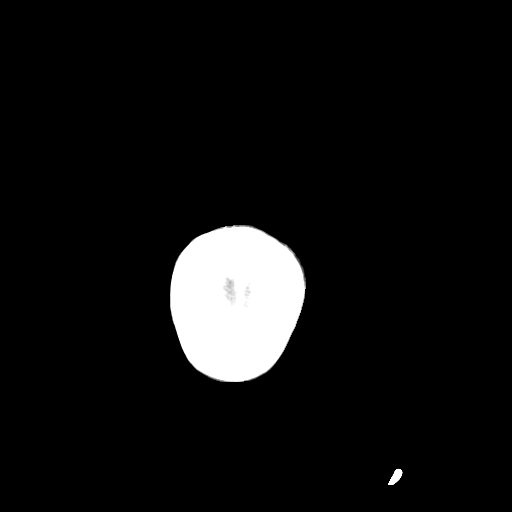
[im 39/42  bone]
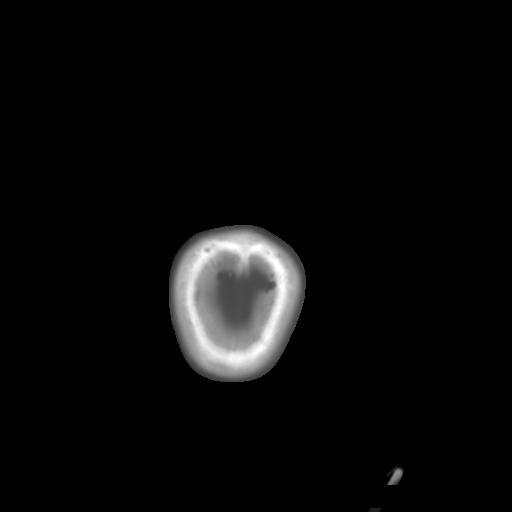

[13 of 30 positions shown; findings below may reference images not displayed]

FINDINGS: There is mild diffuse atrophy. There is no intracranial mass,
hemorrhage, extra-axial fluid collection, or midline shift. There
are lacunar type infarcts in the centra semiovale bilaterally. There
is patchy small vessel disease in the centra semiovale bilaterally.
There is no demonstrable acute infarct. The bony calvarium appears
intact. The mastoid air cells are clear. No intraorbital lesions are
evident.
IMPRESSION: Atrophy with prior small basal ganglia infarcts bilaterally as well
as patchy periventricular small vessel disease. No acute infarct
evident. No hemorrhage or mass effect.
